# Patient Record
Sex: Female | Born: 1935 | Race: White | Hispanic: No | Smoking: Never smoker
Health system: Southern US, Community
[De-identification: ages and names within clinical notes are randomized; demographics above are authoritative.]

## PROBLEM LIST (undated history)

## (undated) DIAGNOSIS — Y92129 Unspecified place in nursing home as the place of occurrence of the external cause: Secondary | ICD-10-CM

## (undated) DIAGNOSIS — J309 Allergic rhinitis, unspecified: Secondary | ICD-10-CM

## (undated) DIAGNOSIS — E669 Obesity, unspecified: Secondary | ICD-10-CM

## (undated) DIAGNOSIS — M1990 Unspecified osteoarthritis, unspecified site: Secondary | ICD-10-CM

## (undated) DIAGNOSIS — R42 Dizziness and giddiness: Secondary | ICD-10-CM

## (undated) DIAGNOSIS — E7800 Pure hypercholesterolemia, unspecified: Secondary | ICD-10-CM

## (undated) DIAGNOSIS — W19XXXA Unspecified fall, initial encounter: Secondary | ICD-10-CM

## (undated) DIAGNOSIS — R413 Other amnesia: Secondary | ICD-10-CM

## (undated) DIAGNOSIS — I10 Essential (primary) hypertension: Secondary | ICD-10-CM

## (undated) DIAGNOSIS — H409 Unspecified glaucoma: Secondary | ICD-10-CM

## (undated) DIAGNOSIS — I359 Nonrheumatic aortic valve disorder, unspecified: Secondary | ICD-10-CM

## (undated) DIAGNOSIS — N390 Urinary tract infection, site not specified: Secondary | ICD-10-CM

## (undated) DIAGNOSIS — J42 Unspecified chronic bronchitis: Secondary | ICD-10-CM

## (undated) DIAGNOSIS — F0390 Unspecified dementia without behavioral disturbance: Secondary | ICD-10-CM

## (undated) DIAGNOSIS — F419 Anxiety disorder, unspecified: Secondary | ICD-10-CM

## (undated) DIAGNOSIS — E785 Hyperlipidemia, unspecified: Secondary | ICD-10-CM

## (undated) DIAGNOSIS — R011 Cardiac murmur, unspecified: Secondary | ICD-10-CM

## (undated) HISTORY — PX: TONSILLECTOMY: SUR1361

## (undated) HISTORY — DX: Unspecified glaucoma: H40.9

## (undated) HISTORY — PX: TOTAL KNEE ARTHROPLASTY: SHX125

## (undated) HISTORY — DX: Other amnesia: R41.3

## (undated) HISTORY — PX: JOINT REPLACEMENT: SHX530

## (undated) HISTORY — PX: APPENDECTOMY: SHX54

## (undated) HISTORY — PX: CARDIAC CATHETERIZATION: SHX172

## (undated) HISTORY — DX: Unspecified osteoarthritis, unspecified site: M19.90

## (undated) HISTORY — DX: Allergic rhinitis, unspecified: J30.9

## (undated) HISTORY — PX: CATARACT EXTRACTION W/ INTRAOCULAR LENS  IMPLANT, BILATERAL: SHX1307

## (undated) HISTORY — DX: Pure hypercholesterolemia, unspecified: E78.00

## (undated) HISTORY — PX: SHOULDER ARTHROSCOPY W/ ROTATOR CUFF REPAIR: SHX2400

## (undated) HISTORY — DX: Unspecified dementia, unspecified severity, without behavioral disturbance, psychotic disturbance, mood disturbance, and anxiety: F03.90

## (undated) HISTORY — DX: Nonrheumatic aortic valve disorder, unspecified: I35.9

## (undated) HISTORY — DX: Dizziness and giddiness: R42

## (undated) HISTORY — DX: Hyperlipidemia, unspecified: E78.5

## (undated) HISTORY — DX: Essential (primary) hypertension: I10

## (undated) HISTORY — DX: Obesity, unspecified: E66.9

---

## 1999-04-25 ENCOUNTER — Encounter: Payer: Self-pay | Admitting: *Deleted

## 1999-04-25 ENCOUNTER — Ambulatory Visit (HOSPITAL_COMMUNITY): Admission: RE | Admit: 1999-04-25 | Discharge: 1999-04-25 | Payer: Self-pay | Admitting: *Deleted

## 1999-12-18 ENCOUNTER — Ambulatory Visit: Admission: RE | Admit: 1999-12-18 | Discharge: 1999-12-18 | Payer: Self-pay | Admitting: *Deleted

## 1999-12-23 ENCOUNTER — Encounter: Payer: Self-pay | Admitting: Orthopedic Surgery

## 1999-12-31 ENCOUNTER — Inpatient Hospital Stay (HOSPITAL_COMMUNITY): Admission: RE | Admit: 1999-12-31 | Discharge: 2000-01-03 | Payer: Self-pay | Admitting: Orthopedic Surgery

## 2000-01-03 ENCOUNTER — Inpatient Hospital Stay (HOSPITAL_COMMUNITY)
Admission: RE | Admit: 2000-01-03 | Discharge: 2000-01-08 | Payer: Self-pay | Admitting: Physical Medicine & Rehabilitation

## 2000-01-05 ENCOUNTER — Encounter: Payer: Self-pay | Admitting: Physical Medicine & Rehabilitation

## 2003-03-06 ENCOUNTER — Ambulatory Visit: Admission: RE | Admit: 2003-03-06 | Discharge: 2003-03-06 | Payer: Self-pay | Admitting: Family Medicine

## 2005-02-12 ENCOUNTER — Ambulatory Visit: Payer: Self-pay | Admitting: Internal Medicine

## 2005-02-26 ENCOUNTER — Ambulatory Visit: Payer: Self-pay | Admitting: Internal Medicine

## 2005-03-11 ENCOUNTER — Emergency Department (HOSPITAL_COMMUNITY): Admission: EM | Admit: 2005-03-11 | Discharge: 2005-03-12 | Payer: Self-pay | Admitting: Emergency Medicine

## 2007-11-17 ENCOUNTER — Emergency Department (HOSPITAL_COMMUNITY): Admission: EM | Admit: 2007-11-17 | Discharge: 2007-11-17 | Payer: Self-pay | Admitting: Emergency Medicine

## 2008-04-19 ENCOUNTER — Inpatient Hospital Stay (HOSPITAL_BASED_OUTPATIENT_CLINIC_OR_DEPARTMENT_OTHER): Admission: RE | Admit: 2008-04-19 | Discharge: 2008-04-19 | Payer: Self-pay | Admitting: Cardiology

## 2010-01-30 ENCOUNTER — Encounter (INDEPENDENT_AMBULATORY_CARE_PROVIDER_SITE_OTHER): Payer: Self-pay | Admitting: *Deleted

## 2010-11-21 NOTE — Letter (Signed)
Summary: Colonoscopy Letter  [facility]  [address] [place], [place] [address]   Phone: [phone]  Fax: [phone]      January 30, 2010 MRN: [id number]   [name] [address] [place], [place]  [address]   Dear [name],   According to your medical record, it is time for you to schedule a Colonoscopy. The American Cancer Society recommends this procedure as a method to detect early colon cancer. Patients with a family history of colon cancer, or a personal history of colon polyps or inflammatory bowel disease are at increased risk.  This letter has beeen generated based on the recommendations made at the time of your procedure. If you feel that in your particular situation this may no longer apply, please contact our office.  Please call our office at [phone] to schedule this appointment or to update your records at your earliest convenience.  Thank you for cooperating with [place] to provide you with the very best care possible.   Sincerely,  Hedwig Morton. Juanda Chance, M.D.  [facility] [phone]

## 2011-03-04 NOTE — Cardiovascular Report (Signed)
[name], [name]              ACCOUNT NO.:  [id number]   MEDICAL RECORD NO.:  [id number]          PATIENT TYPE:  OIB   LOCATION:  1961                         FACILITY:  [facility]   PHYSICIAN:  Armanda Magic, M.D.     DATE OF BIRTH:  1924-10-13   DATE OF PROCEDURE:  04/19/2008  DATE OF DISCHARGE:                            CARDIAC CATHETERIZATION   REFERRING PHYSICIAN:  Dario Guardian, MD.   PROCEDURES:  1. Left heart catheterization.  2. Coronary angiography.  3. Left ventriculography.   OPERATOR:  Armanda Magic, MD   INDICATIONS:  Shortness of breath and abnormal stress Cardiolite study  as well as moderate aortic stenosis.   COMPLICATIONS:  None.   INTRAVENOUS MEDICATIONS:  Versed 1 mg, fentanyl 25 mcg, IV access via  right femoral artery 4-French sheath.   This is an 77 year old female who has a history of shortness of breath  and increased fatigue as well as heart murmur.  2-D echocardiogram  showed mild to moderate aortic stenosis with very mild pulmonary  hypertension.  RV systolic pressure 37 mmHg.  She was found to have a  perfusion defect of mild-to-moderate severity in the basal anterior  septal and apical inferior regions and now presents for cardiac  catheterization.   The patient was brought to cardiac catheterization laboratory in fasting  nonsedated state.  Informed consent was obtained.  The patient was  connected to continuous heart rate and pulse oximetry monitoring, and  intermittent blood pressure monitor.  The right groin was prepped and  draped in a sterile fashion.  Xylocaine 1% was used for local  anesthesia.  Using modified Seldinger technique, a 4-French sheath was  placed in the right femoral artery.  Under fluoroscopic guidance, a 4-  [place] JL-4 catheter was placed in the left coronary artery.  Multiple  cine films were taken at 30-degree RAO and 40-degree LAO views.  This  catheter was then exchanged out over a guidewire for a 4-French 3D  RCA  catheter which was placed in the right coronary artery ostium.  Multiple  cine films were taken in 30-degree RAO and 40-degree LAO views.  His  catheter was then exchanged out over a guidewire for a 4-French angled  pigtail catheter which was placed under fluoroscopic guidance in the  left ventricular cavity.  The left ventriculography was performed in a  30-degree RAO view using a total of 25 mL of contrast at 12 mL per  second.  The catheter was then pulled back across the aortic valve.  At  the end of the procedure, all catheters and sheaths were removed.  Manual compression was performed until adequate hemostasis was obtained.  The patient was transferred back to recovery room in stable condition.   RESULTS:  1. The left main coronary artery is widely patent and mildly      calcified.  It bifurcates into a left anterior descending artery      and left circumflex artery.   The left anterior descending artery is calcified in the proximal  portion.  It is widely patent throughout its course.  The apex can  be  raised to a first diagonal which is widely patent.   Left circumflex is widely patent throughout its course and gives rise to  three obtuse marginal branches.  All three of which are widely patent.  The first one is a moderate-sized vessel, the second and is small, and  the third obtuse marginal branch traverses across the inferior wall and  is widely patent.   The right coronary artery is widely patent and bifurcates distally.  The  posterior descending artery and posterior lateral artery were both of  which are widely patent.   Left ventriculography shows normal LV function, hyperdynamic with EF  70%, LV pressure 140/-8 with an LVEDP of 18 mmHg.  Aortic pressure  122/58 mmHg.  Mean aortic valve gradient 26 mmHg.   ASSESSMENT:  1. Normal coronary arteries.  2. Normal left ventricular function.  3. Mild to moderate aortic stenosis.  4. Elevated LVEDP consistent with  abnormal relaxation.  5. Very mild pulmonary hypertension by 2-D echocardiogram.  Right      ventricular systolic pressure 37 mmHg secondary diastolic      dysfunction.   PLAN:  1. Medical management.  We will add Toprol-XL 25 mg a day for elevated      LVEDP for treatment of diastolic dysfunction.  2. Follow up with my nurse practitioner in 2 weeks for followup.      Armanda Magic, M.D.  Electronically Signed     TT/MEDQ  D:  04/19/2008  T:  04/19/2008  Job:  [id number]

## 2011-03-07 NOTE — Discharge Summary (Signed)
[place] [place]  Patient:    [name], [name]                     MRN: [id number] Adm. Date:  [id number] Disc. Date: 01/08/00 Attending:  Faith Rogue T Dictator:   Mcarthur Rossetti. Angiulli, P.A. CC:         Faith Rogue, M.D.             Vania Rea. Supple, M.D.             Willis Modena. Dreiling, M.D.                           Discharge Summary  DISCHARGE DIAGNOSES: 1. Left total knee replacement, December 31, 1999. 2. Postoperative anemia. 3. History of a right total knee replacement, February, 1998. 4. History of right shoulder surgery and bilateral carpal tunnel.  HISTORY OF PRESENT ILLNESS:  A 77 year old female admitted to [facility] on March 13 with history of a right total knee replacement in February, 1998. he presented with progressive left knee pain, no relief with conservative care. X-rays with advanced degenerative joint disease, obliteration of joint space. he underwent a left total knee replacement, March 13, per Dr. Rennis Chris.  She was placed on Coumadin for deep venous thrombosis prophylaxis and weightbearing as tolerated. Postoperative anemia without need for transfusion.  Hospital course uneventful,  ambulating with supervision.  No chest pain or shortness of breath.  Latest INR of 2.0, hemoglobin 9.5.  Chemistries unremarkable. Chest x-ray - no active disease. EKG - normal sinus rhythm.  Admitted for a comprehensive rehab program.  PAST MEDICAL HISTORY:  Hemorrhoids and aortic stenosis with echocardiogram, February, 2001, showing only mild aortic stenosis, normal left ventricular function.  PAST SURGICAL HISTORY:  Right total knee replacement, right shoulder surgery, and bilateral carpal tunnel.  ALLERGIES:  None.  HABITS:  No alcohol or tobacco.  MEDICATIONS:  She was on no medications prior to admission.  SOCIAL HISTORY:  She lives with her husband in [place], independent prior to  admission.  They live in a  one-level home with three steps to entry.  Plans to ive with her daughter short-term on discharge until more mobile.  HOSPITAL COURSE:  Patient did well while in rehabilitation services with therapies initiated on a b.i.d. basis.  The following issues were followed during patients rehab course.  Pertaining to [name] left total knee replacement, it remained stable, surgical site showed staples to be in place.  There was some mild erythema. She did spike a temperature to 101.4.  Blood cultures were negative. Urinalysis was unremarkable.  Chest x-ray showed no active disease.  In light of this temperature, she was placed prophylactically on Keflex for wound coverage.  She  denied any chest pain, shortness of breath, no chills or sweats.  Her postoperative anemia was stable with latest hemoglobin of 8.9, hematocrit 26.1.  This showed  modest improvement from 8.4.  She continued her iron supplement.  There were no  bleeding episodes.  She continued on Pepcid while on Coumadin therapy.  She would remain on Coumadin for a total of four weeks to be followed by her primary physician, Dr. Lattie Corns.  Her right total knee replacement she had undergone in  February, 1998, was without issue.  She had some limited range of motion to the  shoulder girdle on the right from history of surgery.  Overall, for her functional mobility,  she was ambulating household functional distances with supervision, essentially independent to standby assist in all areas of activities of daily living, dressing, grooming, and homemaking.  Overall, her strength and endurance greatly improved.  She was encouraged of her overall progress.  She was discharged to home with her daughter after family teaching completed with recommendations or home health therapies.  Latest lab showed an INR of 2.1, hemoglobin 8.9, hematocrit 26.1, sodium 137, potassium 3.7, BUN 13, creatinine 0.5.  DISCHARGE MEDICATIONS: 1.  Coumadin daily with dose to be established at time of discharge x 3 more weeks. 2. trinsicon one capsule twice daily. 3. Pepcid 20 mg daily. 4. Tylox one or two tablets every 4 to 6 hours as needed for pain.  ACTIVITY:  Weightbearing as tolerated with walker.  DIET:  Regular.  WOUND CARE:  Follow with Dr. Rennis Chris in seven days for removal of staples. Patient was to remain on Keflex of 250 mg four times daily for wound coverage until follow-up with Dr. Rennis Chris.  Advised to call orthopedic surgery if any increased  redness, drainage, or fever.  SPECIAL INSTRUCTIONS:  No driving, no aspirin or ibuprofen while on Coumadin. ome health physical therapy, home health nurse for prothrombin time on Friday, March 23, to be followed by Dr. Lattie Corns. DD:  01/07/00 TD:  01/07/00 Job: [id number]

## 2011-05-07 ENCOUNTER — Telehealth: Payer: Self-pay | Admitting: *Deleted

## 2011-05-07 NOTE — Telephone Encounter (Signed)
Patient is overdue for recall colonoscopy to follow up on adenomatous colon polyps and diverticulosis found in 2006 procedure. Patient is 77 years old, therefore, she will need an office visit first. I have left a message for patient to call back.

## 2011-05-16 NOTE — Telephone Encounter (Signed)
Patient states that she did get a letter and that she thinks her daughter is going to make an appointment for her to come in. I have explained that she will need an office visit before a colonoscopy due to her age of 64. Patient verbalizes understanding and states that her daughter will call back to schedule.

## 2013-07-25 ENCOUNTER — Other Ambulatory Visit (HOSPITAL_COMMUNITY): Payer: Self-pay

## 2013-08-05 ENCOUNTER — Other Ambulatory Visit (HOSPITAL_COMMUNITY): Payer: Self-pay | Admitting: Cardiology

## 2013-08-05 DIAGNOSIS — I359 Nonrheumatic aortic valve disorder, unspecified: Secondary | ICD-10-CM

## 2013-08-09 ENCOUNTER — Other Ambulatory Visit (HOSPITAL_COMMUNITY): Payer: Self-pay

## 2013-08-22 ENCOUNTER — Ambulatory Visit (HOSPITAL_COMMUNITY): Payer: Medicare Other | Attending: Cardiology

## 2013-08-22 DIAGNOSIS — I059 Rheumatic mitral valve disease, unspecified: Secondary | ICD-10-CM | POA: Insufficient documentation

## 2013-08-22 DIAGNOSIS — I379 Nonrheumatic pulmonary valve disorder, unspecified: Secondary | ICD-10-CM | POA: Insufficient documentation

## 2013-08-22 DIAGNOSIS — I10 Essential (primary) hypertension: Secondary | ICD-10-CM | POA: Insufficient documentation

## 2013-08-22 DIAGNOSIS — I079 Rheumatic tricuspid valve disease, unspecified: Secondary | ICD-10-CM | POA: Insufficient documentation

## 2013-08-22 DIAGNOSIS — I359 Nonrheumatic aortic valve disorder, unspecified: Secondary | ICD-10-CM | POA: Insufficient documentation

## 2013-08-22 NOTE — Progress Notes (Signed)
Echocardiogram performed.  

## 2013-09-06 ENCOUNTER — Encounter: Payer: Self-pay | Admitting: *Deleted

## 2013-09-06 ENCOUNTER — Encounter: Payer: Self-pay | Admitting: Cardiology

## 2013-09-06 DIAGNOSIS — E7800 Pure hypercholesterolemia, unspecified: Secondary | ICD-10-CM | POA: Insufficient documentation

## 2013-09-06 DIAGNOSIS — E785 Hyperlipidemia, unspecified: Secondary | ICD-10-CM | POA: Insufficient documentation

## 2013-09-06 DIAGNOSIS — I350 Nonrheumatic aortic (valve) stenosis: Secondary | ICD-10-CM | POA: Insufficient documentation

## 2013-09-06 DIAGNOSIS — M1990 Unspecified osteoarthritis, unspecified site: Secondary | ICD-10-CM | POA: Insufficient documentation

## 2013-09-06 DIAGNOSIS — J309 Allergic rhinitis, unspecified: Secondary | ICD-10-CM | POA: Insufficient documentation

## 2013-09-06 DIAGNOSIS — E669 Obesity, unspecified: Secondary | ICD-10-CM | POA: Insufficient documentation

## 2013-09-07 ENCOUNTER — Ambulatory Visit (INDEPENDENT_AMBULATORY_CARE_PROVIDER_SITE_OTHER): Payer: Medicare Other | Admitting: Cardiology

## 2013-09-07 ENCOUNTER — Encounter: Payer: Self-pay | Admitting: Cardiology

## 2013-09-07 ENCOUNTER — Encounter: Payer: Self-pay | Admitting: General Surgery

## 2013-09-07 VITALS — BP 138/64 | HR 80 | Ht 60.0 in | Wt 170.0 lb

## 2013-09-07 DIAGNOSIS — I359 Nonrheumatic aortic valve disorder, unspecified: Secondary | ICD-10-CM

## 2013-09-07 DIAGNOSIS — I10 Essential (primary) hypertension: Secondary | ICD-10-CM | POA: Insufficient documentation

## 2013-09-07 NOTE — Patient Instructions (Addendum)
Your physician recommends that you continue on your current medications as directed. Please refer to the Current Medication list given to you today.  Your physician wants you to follow-up in: 6 Months with Dr Turner You will receive a reminder letter in the mail two months in advance. If you don't receive a letter, please call our office to schedule the follow-up appointment.  

## 2013-09-07 NOTE — Progress Notes (Addendum)
  [address] [place], [place]  [address] Phone: [phone] Fax:  [phone]  Date:  09/07/2013   ID:  [name], DOB May 24, 1924, MRN [id number]  PCP:  Merri Brunette, MD  Cardiologist:  Armanda Magic, MD     History of Present Illness: [name] is a 79 y.o. female with a history of moderate to severe AS and HTN who presents today for followup.  She is doing well.  She denies any chest pain, SOB, DOE, LE edema, dizziness, palpitations or syncope.   Wt Readings from Last 3 Encounters:  No data found for Wt     Past Medical History  Diagnosis Date  . Aortic valve disorders   . Allergic rhinitis   . Arthritis   . Dyslipidemia   . Obesity   . Glaucoma   . Hypercholesteremia   . Vertigo   . Hypertension     Current Outpatient Prescriptions  Medication Sig Dispense Refill  . aspirin 81 MG tablet Take 81 mg by mouth daily.      . bimatoprost (LUMIGAN) 0.03 % ophthalmic solution 1 drop at bedtime.      [name] ezetimibe-simvastatin (VYTORIN) 10-20 MG per tablet Take 1 tablet by mouth daily.      . metoprolol succinate (TOPROL-XL) 25 MG 24 hr tablet Take 25 mg by mouth daily.       No current facility-administered medications for this visit.    Allergies:   Not on File  Social History:  The patient  reports that she has never smoked. She does not have any smokeless tobacco history on file. She reports that she does not drink alcohol or use illicit drugs.   Family History:  The patient's family history includes Heart disease in her sister.   ROS:  Please see the history of present illness.      All other systems reviewed and negative.   PHYSICAL EXAM: VS:  There were no vitals taken for this visit. Well nourished, well developed, in no acute distress HEENT: normal Neck: no JVD Cardiac:  normal S1, S2; RRR; 2/6 late peaking systolic murmur at RUSB radiating to LLSB and left carotid artery Lungs:  clear to auscultation bilaterally, no wheezing,  rhonchi or rales Abd: soft, nontender, no hepatomegaly Ext: no edema Skin: warm and dry Neuro:  CNs 2-12 intact, no focal abnormalities noted  EKG:     NSR  ASSESSMENT AND PLAN:  1. Moderate to Severe AS  - recheck echo to reassess progression of AS in 6 months 2. HTN - controlled  - continue Toprol  Followup with me in 6 months  Signed, Armanda Magic, MD 09/07/2013 11:45 AM

## 2013-09-29 ENCOUNTER — Telehealth: Payer: Self-pay | Admitting: Cardiology

## 2013-09-29 ENCOUNTER — Other Ambulatory Visit: Payer: Self-pay

## 2013-09-29 MED ORDER — METOPROLOL SUCCINATE ER 25 MG PO TB24: 25.0000 mg | ORAL_TABLET | Freq: Every day | ORAL | Status: DC

## 2013-09-29 NOTE — Telephone Encounter (Signed)
New message     Refill metoprolol-------[facility]

## 2015-07-17 ENCOUNTER — Ambulatory Visit (INDEPENDENT_AMBULATORY_CARE_PROVIDER_SITE_OTHER): Payer: Medicare Other | Admitting: Cardiology

## 2015-07-17 ENCOUNTER — Encounter: Payer: Self-pay | Admitting: Cardiology

## 2015-07-17 VITALS — BP 136/62 | HR 94 | Ht 61.0 in | Wt 170.4 lb

## 2015-07-17 DIAGNOSIS — I359 Nonrheumatic aortic valve disorder, unspecified: Secondary | ICD-10-CM | POA: Diagnosis not present

## 2015-07-17 DIAGNOSIS — I350 Nonrheumatic aortic (valve) stenosis: Secondary | ICD-10-CM

## 2015-07-17 DIAGNOSIS — I10 Essential (primary) hypertension: Secondary | ICD-10-CM | POA: Diagnosis not present

## 2015-07-17 MED ORDER — METOPROLOL SUCCINATE ER 25 MG PO TB24: 25.0000 mg | ORAL_TABLET | Freq: Every day | ORAL | Status: DC

## 2015-07-17 NOTE — Patient Instructions (Addendum)
Medication Instructions:  Your physician recommends that you continue on your current medications as directed. Please refer to the Current Medication list given to you today.      Labwork: NONE ORDERED  Testing/Procedures: Your physician has requested that you have an echocardiogram. Echocardiography is a painless test that uses sound waves to create images of your heart. It provides your doctor with information about the size and shape of your heart and how well your heart's chambers and valves are working. This procedure takes approximately one hour. There are no restrictions for this procedure.    Follow-Up: Your physician wants you to follow-up BASED UPON THE RESULTS FROM YOUR ECHO.  YOU WILL CONTACTED BY PHONE.   Any Other Special Instructions Will Be Listed Below (If Applicable).  Echocardiogram An echocardiogram, or echocardiography, uses sound waves (ultrasound) to produce an image of your heart. The echocardiogram is simple, painless, obtained within a short period of time, and offers valuable information to your health care provider. The images from an echocardiogram can provide information such as:  Evidence of coronary artery disease (CAD).  Heart size.  Heart muscle function.  Heart valve function.  Aneurysm detection.  Evidence of a past heart attack.  Fluid buildup around the heart.  Heart muscle thickening.  Assess heart valve function. LET [facility] CARE PROVIDER KNOW ABOUT:  Any allergies you have.  All medicines you are taking, including vitamins, herbs, eye drops, creams, and over-the-counter medicines.  Previous problems you or members of your family have had with the use of anesthetics.  Any blood disorders you have.  Previous surgeries you have had.  Medical conditions you have.  Possibility of pregnancy, if this applies. BEFORE THE PROCEDURE  No special preparation is needed. Eat and drink normally.  PROCEDURE   In order to produce  an image of your heart, gel will be applied to your chest and a wand-like tool (transducer) will be moved over your chest. The gel will help transmit the sound waves from the transducer. The sound waves will harmlessly bounce off your heart to allow the heart images to be captured in real-time motion. These images will then be recorded.  You may need an IV to receive a medicine that improves the quality of the pictures. AFTER THE PROCEDURE You may return to your normal schedule including diet, activities, and medicines, unless your health care provider tells you otherwise. Document Released: 10/03/2000 Document Revised: 02/20/2014 Document Reviewed: 06/13/2013 [facility] Patient Information 2015 [place], [place]. This information is not intended to replace advice given to you by your health care provider. Make sure you discuss any questions you have with your health care provider.

## 2015-07-17 NOTE — Progress Notes (Signed)
07/17/2015 [name]   02-15-1924  [id number]  Primary Physician Allean Found, MD Primary Cardiologist: Dr. Mayford Knife   Reason for Visit/CC: Follow-up for aortic stenosis  HPI:  The patient is a 81 year old female followed in the past by Dr. Mayford Knife. Her last office visit was in November 2014. She has a history of aortic stenosis, hypertension and HLD.  Her family doctor follows her lipid panel. Her last echocardiogram was 08/22/2013. Aortic stenosis at that time was felt to be moderate to severe, also mild regurgitation. Aortic valve area was measured at 1.16 cm. Mean gradient was 41 mmHg. Ventricular ejection fraction was normal at that time at 55-60% with normal wall motion. Grade 2 diastolic dysfunction was noted. It was recommended by Dr. Mayford Knife that she get a repeat echo to reassess progression of her AS in 6 months, however the patient failed to follow-up.  She presents back to clinic today for f/u. She is accompanied by her daughter. She presents back to re-establish care and for re-evaluation. She denies any chest pain. She does note exertional dyspnea with her ADLs, especially while dressing and bathing. She denies any resting dyspnea, orthopnea or PND. She also denies syncope/ near syncope. She reports full medication compliance.    Current Outpatient Prescriptions  Medication Sig Dispense Refill  . aspirin 81 MG tablet Take 81 mg by mouth daily.    [name] atorvastatin (LIPITOR) 20 MG tablet Take 20 mg by mouth daily.    [name] ezetimibe-simvastatin (VYTORIN) 10-20 MG per tablet Take 1 tablet by mouth daily.    . metoprolol succinate (TOPROL-XL) 25 MG 24 hr tablet Take 1 tablet (25 mg total) by mouth daily. 30 tablet 6  . TRAVATAN Z 0.004 % SOLN ophthalmic solution Place 1 drop into both eyes at bedtime.      No current facility-administered medications for this visit.    No Known Allergies  Social History   Social History  . Marital Status: Widowed    Spouse Name: N/A    . Number of Children: N/A  . Years of Education: N/A   Occupational History  . Not on file.   Social History Main Topics  . Smoking status: Never Smoker   . Smokeless tobacco: Never Used  . Alcohol Use: No  . Drug Use: No  . Sexual Activity: Not on file   Other Topics Concern  . Not on file   Social History Narrative     Review of Systems: General: negative for chills, fever, night sweats or weight changes.  Cardiovascular: negative for chest pain, dyspnea on exertion, edema, orthopnea, palpitations, paroxysmal nocturnal dyspnea or shortness of breath Dermatological: negative for rash Respiratory: negative for cough or wheezing Urologic: negative for hematuria Abdominal: negative for nausea, vomiting, diarrhea, bright red blood per rectum, melena, or hematemesis Neurologic: negative for visual changes, syncope, or dizziness All other systems reviewed and are otherwise negative except as noted above.    Blood pressure 136/62, pulse 94, height  (1.549 m), weight 170 lb 6.4 oz (77.293 kg).  General appearance: alert, cooperative and no distress Neck: no carotid bruit and no JVD Lungs: clear to auscultation bilaterally Heart: regular rate and rhythm and 4/6 SM throughout the precordium. Extremities: trace bilateteral LEE Pulses: 2+ and symmetric Skin: warm and dry Neurologic: Grossly normal  EKG NSR. 94 bpm. No ischemia.   ASSESSMENT AND PLAN:   1. Aortic Stenosis: Her last assessment was in 2014 and her aortic stenosis at that time was felt to  be moderate to severe with an aortic valve area of 1.16 cm and a mean gradient of 41 mmHg. She denies any angina or syncope/near syncop,e however notes exertional dyspnea with mild physical activity/basic ADLs. No resting dyspnea. Recommend that we get a repeat echocardiogram to reassess her valve area and gradient, as well as LVF. Given her advanced age, may be best to consider TAVR as a potential treatment option. Will await  results before placing a referral to the [facility].   2. HTN: BP is well controlled.   3. HLD: on statin therapy with Lipitor. PCP follows lipid profile.    PLAN  Obtain f/u 2D echo to reassess aortic valve. Will consider referral to Dr. Excell Seltzer or Dr. Clifton James if results show severe progression. F/u with Dr. Mayford Knife in 6 months.   SIMMONS, BRITTAINY PA-C 07/17/2015 2:22 PM

## 2015-07-31 ENCOUNTER — Other Ambulatory Visit (HOSPITAL_COMMUNITY): Payer: Medicare Other

## 2015-11-14 ENCOUNTER — Encounter (HOSPITAL_COMMUNITY): Payer: Self-pay

## 2015-11-14 ENCOUNTER — Emergency Department (HOSPITAL_COMMUNITY): Payer: Medicare Other

## 2015-11-14 ENCOUNTER — Inpatient Hospital Stay (HOSPITAL_COMMUNITY)
Admission: EM | Admit: 2015-11-14 | Discharge: 2015-11-16 | DRG: 086 | Disposition: A | Discharge status: Skilled Nursing Facility | Payer: Medicare Other | Attending: Internal Medicine | Admitting: Internal Medicine

## 2015-11-14 DIAGNOSIS — W19XXXA Unspecified fall, initial encounter: Secondary | ICD-10-CM | POA: Diagnosis present

## 2015-11-14 DIAGNOSIS — S52691A Other fracture of lower end of right ulna, initial encounter for closed fracture: Secondary | ICD-10-CM | POA: Diagnosis present

## 2015-11-14 DIAGNOSIS — S0083XA Contusion of other part of head, initial encounter: Secondary | ICD-10-CM | POA: Diagnosis not present

## 2015-11-14 DIAGNOSIS — Z7982 Long term (current) use of aspirin: Secondary | ICD-10-CM

## 2015-11-14 DIAGNOSIS — Y92018 Other place in single-family (private) house as the place of occurrence of the external cause: Secondary | ICD-10-CM

## 2015-11-14 DIAGNOSIS — Z8249 Family history of ischemic heart disease and other diseases of the circulatory system: Secondary | ICD-10-CM

## 2015-11-14 DIAGNOSIS — S62101A Fracture of unspecified carpal bone, right wrist, initial encounter for closed fracture: Secondary | ICD-10-CM | POA: Diagnosis not present

## 2015-11-14 DIAGNOSIS — S066X0A Traumatic subarachnoid hemorrhage without loss of consciousness, initial encounter: Principal | ICD-10-CM | POA: Diagnosis present

## 2015-11-14 DIAGNOSIS — I609 Nontraumatic subarachnoid hemorrhage, unspecified: Secondary | ICD-10-CM | POA: Diagnosis not present

## 2015-11-14 DIAGNOSIS — W010XXA Fall on same level from slipping, tripping and stumbling without subsequent striking against object, initial encounter: Secondary | ICD-10-CM | POA: Diagnosis present

## 2015-11-14 DIAGNOSIS — R627 Adult failure to thrive: Secondary | ICD-10-CM | POA: Diagnosis not present

## 2015-11-14 DIAGNOSIS — M25512 Pain in left shoulder: Secondary | ICD-10-CM | POA: Diagnosis present

## 2015-11-14 DIAGNOSIS — S52591A Other fractures of lower end of right radius, initial encounter for closed fracture: Secondary | ICD-10-CM | POA: Diagnosis present

## 2015-11-14 DIAGNOSIS — I350 Nonrheumatic aortic (valve) stenosis: Secondary | ICD-10-CM

## 2015-11-14 DIAGNOSIS — Z66 Do not resuscitate: Secondary | ICD-10-CM | POA: Diagnosis present

## 2015-11-14 DIAGNOSIS — S52531A Colles' fracture of right radius, initial encounter for closed fracture: Secondary | ICD-10-CM

## 2015-11-14 DIAGNOSIS — E7800 Pure hypercholesterolemia, unspecified: Secondary | ICD-10-CM | POA: Diagnosis present

## 2015-11-14 DIAGNOSIS — I5189 Other ill-defined heart diseases: Secondary | ICD-10-CM | POA: Diagnosis present

## 2015-11-14 DIAGNOSIS — E785 Hyperlipidemia, unspecified: Secondary | ICD-10-CM | POA: Diagnosis present

## 2015-11-14 DIAGNOSIS — S62109A Fracture of unspecified carpal bone, unspecified wrist, initial encounter for closed fracture: Secondary | ICD-10-CM | POA: Diagnosis present

## 2015-11-14 DIAGNOSIS — B3789 Other sites of candidiasis: Secondary | ICD-10-CM | POA: Diagnosis present

## 2015-11-14 DIAGNOSIS — I10 Essential (primary) hypertension: Secondary | ICD-10-CM | POA: Diagnosis present

## 2015-11-14 LAB — I-STAT CHEM 8, ED
BUN: 19 mg/dL (ref 6–20)
CALCIUM ION: 1.14 mmol/L (ref 1.13–1.30)
CREATININE: 0.6 mg/dL (ref 0.44–1.00)
Chloride: 105 mmol/L (ref 101–111)
GLUCOSE: 111 mg/dL — AB (ref 65–99)
HCT: 42 % (ref 36.0–46.0)
Hemoglobin: 14.3 g/dL (ref 12.0–15.0)
Potassium: 4.3 mmol/L (ref 3.5–5.1)
SODIUM: 141 mmol/L (ref 135–145)
TCO2: 26 mmol/L (ref 0–100)

## 2015-11-14 LAB — URINALYSIS, ROUTINE W REFLEX MICROSCOPIC
BILIRUBIN URINE: NEGATIVE
Glucose, UA: NEGATIVE mg/dL
HGB URINE DIPSTICK: NEGATIVE
KETONES UR: 40 mg/dL — AB
Leukocytes, UA: NEGATIVE
NITRITE: NEGATIVE
Protein, ur: NEGATIVE mg/dL
Specific Gravity, Urine: 1.014 (ref 1.005–1.030)
pH: 7.5 (ref 5.0–8.0)

## 2015-11-14 LAB — CBC WITH DIFFERENTIAL/PLATELET
BASOS PCT: 0 %
Basophils Absolute: 0 10*3/uL (ref 0.0–0.1)
EOS ABS: 0.1 10*3/uL (ref 0.0–0.7)
EOS PCT: 1 %
HCT: 40.8 % (ref 36.0–46.0)
Hemoglobin: 13.1 g/dL (ref 12.0–15.0)
Lymphocytes Relative: 26 %
Lymphs Abs: 3.3 10*3/uL (ref 0.7–4.0)
MCH: 29.5 pg (ref 26.0–34.0)
MCHC: 32.1 g/dL (ref 30.0–36.0)
MCV: 91.9 fL (ref 78.0–100.0)
MONO ABS: 1 10*3/uL (ref 0.1–1.0)
MONOS PCT: 8 %
NEUTROS PCT: 65 %
Neutro Abs: 8.2 10*3/uL — ABNORMAL HIGH (ref 1.7–7.7)
PLATELETS: 211 10*3/uL (ref 150–400)
RBC: 4.44 MIL/uL (ref 3.87–5.11)
RDW: 14.2 % (ref 11.5–15.5)
WBC: 12.6 10*3/uL — ABNORMAL HIGH (ref 4.0–10.5)

## 2015-11-14 LAB — BASIC METABOLIC PANEL
Anion gap: 9 (ref 5–15)
BUN: 18 mg/dL (ref 6–20)
CALCIUM: 9.1 mg/dL (ref 8.9–10.3)
CO2: 26 mmol/L (ref 22–32)
CREATININE: 0.54 mg/dL (ref 0.44–1.00)
Chloride: 105 mmol/L (ref 101–111)
GFR calc non Af Amer: >60 mL/min (ref >60)
Glucose, Bld: 117 mg/dL — ABNORMAL HIGH (ref 65–99)
Potassium: 4.4 mmol/L (ref 3.5–5.1)
SODIUM: 140 mmol/L (ref 135–145)

## 2015-11-14 MED ORDER — HYDROMORPHONE HCL 1 MG/ML IJ SOLN
0.5000 mg | INTRAMUSCULAR | Status: AC | PRN
  Administered 2015-11-14 (×2): 0.5 mg via INTRAVENOUS
  Filled 2015-11-14 (×2): qty 1

## 2015-11-14 MED ORDER — METOPROLOL SUCCINATE ER 25 MG PO TB24
25.0000 mg | ORAL_TABLET | Freq: Every day | ORAL | Status: DC
  Administered 2015-11-14 – 2015-11-16 (×3): 25 mg via ORAL
  Filled 2015-11-14 (×3): qty 1

## 2015-11-14 MED ORDER — ADULT MULTIVITAMIN W/MINERALS CH
1.0000 | ORAL_TABLET | Freq: Every day | ORAL | Status: DC
  Administered 2015-11-14 – 2015-11-16 (×3): 1 via ORAL
  Filled 2015-11-14 (×3): qty 1

## 2015-11-14 MED ORDER — NYSTATIN 100000 UNIT/GM EX POWD
Freq: Two times a day (BID) | CUTANEOUS | Status: DC
  Administered 2015-11-14 – 2015-11-15 (×2): via TOPICAL
  Filled 2015-11-14: qty 15

## 2015-11-14 MED ORDER — EZETIMIBE-SIMVASTATIN 10-20 MG PO TABS
1.0000 | ORAL_TABLET | Freq: Every day | ORAL | Status: DC
  Administered 2015-11-14 – 2015-11-16 (×3): 1 via ORAL
  Filled 2015-11-14 (×3): qty 1

## 2015-11-14 MED ORDER — LATANOPROST 0.005 % OP SOLN
1.0000 [drp] | Freq: Every day | OPHTHALMIC | Status: DC
  Administered 2015-11-14 – 2015-11-15 (×2): 1 [drp] via OPHTHALMIC
  Filled 2015-11-14: qty 2.5

## 2015-11-14 MED ORDER — TRAMADOL HCL 50 MG PO TABS
50.0000 mg | ORAL_TABLET | Freq: Four times a day (QID) | ORAL | Status: DC | PRN
  Administered 2015-11-14 – 2015-11-16 (×4): 50 mg via ORAL
  Filled 2015-11-14 (×4): qty 1

## 2015-11-14 MED ORDER — ONDANSETRON HCL 4 MG/2ML IJ SOLN: 4.0000 mg | Freq: Three times a day (TID) | INTRAMUSCULAR | Status: AC | PRN

## 2015-11-14 NOTE — H&P (Addendum)
History and Physical  [name]:[id number] DOB: 29-Sep-1924 DOA: 11/14/2015  Referring physician: EDP PCP: Allean Found, MD   Chief Complaint: fall  HPI: [name] is a 82 y.o. female  With h/o aortic stenosis, HTN, HLD, osteoarthritis presented to [facility] ED due to fall yesterday evening, now with significant bruising over her face on the left side and a large bruise on her left upper arm. Patient reported she has been having unsteadiness for the past year which she stopped driving, and also stopped going to church, but no prior falls except yesterday evening. She has been using a walker. She denies fever, chest pain, no sob, no palpitation, no dizziness, no tinnitus, no room spinning, no LOC, no diarrhea, no dysuria, weight has been stable last year.   Review of Systems:  Detail per HPI, Review of systems are otherwise negative  Past Medical History  Diagnosis Date  . Aortic valve disorders   . Allergic rhinitis   . Arthritis   . Dyslipidemia   . Obesity   . Glaucoma   . Hypercholesteremia   . Vertigo   . Hypertension    Past Surgical History  Procedure Laterality Date  . Appendectomy    . Shoulder surgery    . Bilataral cataracts removal with lens replacement dr. Darel Hong    . Cardiac catheterization      normal coronary arteries   Social History:  reports that she has never smoked. She has never used smokeless tobacco. She reports that she does not drink alcohol or use illicit drugs. Patient lives at home& is able to participate in activities of daily living with assistance  No Known Allergies  Family History  Problem Relation Age of Onset  . Heart disease Sister       Prior to Admission medications   Medication Sig Start Date End Date Taking? Authorizing Provider  acetaminophen (TYLENOL) 500 MG tablet Take 1,000 mg by mouth every 6 (six) hours as needed for moderate pain or headache.   Yes Historical Provider, MD  aspirin 81 MG tablet Take 81  mg by mouth daily.   Yes Historical Provider, MD  atorvastatin (LIPITOR) 20 MG tablet Take 20 mg by mouth daily. 08/29/13  Yes Historical Provider, MD  ezetimibe-simvastatin (VYTORIN) 10-20 MG per tablet Take 1 tablet by mouth daily.   Yes Historical Provider, MD  metoprolol succinate (TOPROL-XL) 25 MG 24 hr tablet Take 1 tablet (25 mg total) by mouth daily. 07/17/15  Yes Brittainy Sherlynn Carbon, PA-C  Multiple Vitamin (MULTIVITAMIN WITH MINERALS) TABS tablet Take 1 tablet by mouth daily.   Yes Historical Provider, MD  traMADol (ULTRAM) 50 MG tablet Take 50 mg by mouth 4 (four) times daily as needed for moderate pain.  11/05/15  Yes Historical Provider, MD  TRAVATAN Z 0.004 % SOLN ophthalmic solution Place 1 drop into both eyes at bedtime.  08/08/13  Yes Historical Provider, MD    Physical Exam: There were no vitals taken for this visit.  General:  NAD, facial bruising, more around her left eye Eyes: PERRL ENT: unremarkable Neck: supple, no JVD Cardiovascular: RRR, + 3/6 murmur Respiratory: CTABL Abdomen: soft/ND/ND, positive bowel sounds Skin: fungal rash under bilateral breast with odor Musculoskeletal:  No edema Psychiatric: calm/cooperative Neurologic: no focal findings            Labs on Admission:  Basic Metabolic Panel: No results for input(s): NA, K, CL, CO2, GLUCOSE, BUN, CREATININE, CALCIUM, MG, PHOS in the last 168 hours. Liver  Function Tests: No results for input(s): AST, ALT, ALKPHOS, BILITOT, PROT, ALBUMIN in the last 168 hours. No results for input(s): LIPASE, AMYLASE in the last 168 hours. No results for input(s): AMMONIA in the last 168 hours. CBC: No results for input(s): WBC, NEUTROABS, HGB, HCT, MCV, PLT in the last 168 hours. Cardiac Enzymes: No results for input(s): CKTOTAL, CKMB, CKMBINDEX, TROPONINI in the last 168 hours.  BNP (last 3 results) No results for input(s): BNP in the last 8760 hours.  ProBNP (last 3 results) No results for input(s): PROBNP in  the last 8760 hours.  CBG: No results for input(s): GLUCAP in the last 168 hours.  Radiological Exams on Admission: Dg Wrist Complete Right  11/14/2015  CLINICAL DATA:  Pain following fall EXAM: RIGHT WRIST - COMPLETE 3+ VIEW COMPARISON:  None. FINDINGS: Frontal, oblique, lateral, and ulnar deviation scaphoid images were obtained. There is a comminuted fracture of the distal radial metaphysis with impaction at the fracture site. There is volar angulation distally. There is an avulsion of the ulnar styloid. There is generalized soft tissue swelling. Bones are osteoporotic. There is advanced osteoarthritic change in the first carpal -metacarpal joint. 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 There is extensive triangular fiber cartilage region calcification. IMPRESSION: Comminuted, impacted fracture of the distal radial metaphysis with volar angulation distally. Avulsion of the ulnar styloid. Extensive calcification in the triangular fibrocartilage, likely due to chronic tear in this region. 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 Extensive osteoarthritic change in the first carpal -metacarpal joint. Bones are osteoporotic. Electronically Signed   By: Bretta Bang III M.D.   On: 11/14/2015 14:44   Ct Head Wo Contrast  11/14/2015  CLINICAL DATA:  Fall EXAM: CT HEAD WITHOUT CONTRAST CT MAXILLOFACIAL WITHOUT CONTRAST TECHNIQUE: Multidetector CT imaging of the head and maxillofacial structures were performed using the standard protocol without intravenous contrast. Multiplanar CT image reconstructions of the maxillofacial structures were also generated. COMPARISON:  03/11/2005 FINDINGS: CT HEAD FINDINGS There is a small amount of subarachnoid hemorrhage in the right parietal lobe. Chronic ischemic changes are present in the periventricular white matter. There is soft tissue swelling over the frontal bone to the left of midline. No  underlying skull fracture. There is fluid in the frontal sinuses. Mastoid air cells are clear. No midline shift. Ventricular system is within normal limits other than ex vacuo dilatation from atrophy. CT MAXILLOFACIAL FINDINGS No evidence of facial bone fracture. Advanced degenerative and chronic changes in the cervical spine. Soft tissue swelling is noted over the frontal bone and nasal bridge. No evidence of orbital hemorrhage. IMPRESSION: Acute subarachnoid hemorrhage in the right parietal region. No evidence of facial bone fracture. Critical Value/emergent results were called by telephone at the time of interpretation on 11/14/2015 at 3:12 pm to Dr. Nelva Nay , who verbally acknowledged these results. Electronically Signed   By: Jolaine Click M.D.   On: 11/14/2015 15:12   Dg Shoulder Left  11/14/2015  CLINICAL DATA:  Pain following fall EXAM: LEFT SHOULDER - 2+ VIEW COMPARISON:  None. FINDINGS: Frontal and Y scapular views obtained. There is no appreciable fracture or dislocation. There is advanced osteoarthritic change in the left glenohumeral joint with marked bony remodeling in the glenohumeral joint. There is moderately severe narrowing of the acromioclavicular joint. There is narrowing between the humeral head and acromion. No well-defined erosive change. There is calcification in the left and right carotid arteries. IMPRESSION: Advanced generalized  osteoarthritic change in the left shoulder with marked bony overgrowth and narrowing of the glenohumeral joint. No acute fracture or dislocation is evident. Suspect chronic rotator cuff tear given the loss of space between the acromion and humeral head. Note that there is bilateral carotid artery calcification. Electronically Signed   By: Bretta Bang III M.D.   On: 11/14/2015 14:42   Dg Humerus Left  11/14/2015  CLINICAL DATA:  Status post fall.  Left arm pain. EXAM: LEFT HUMERUS - 2+ VIEW COMPARISON:  None. FINDINGS: No acute fracture or  dislocation. Severe osteoarthritis of the left glenohumeral joint. No soft tissue abnormality. IMPRESSION: No acute osseous injury of the left humerus. Electronically Signed   By: Elige Ko   On: 11/14/2015 14:44   Dg Hand Complete Right  11/14/2015  CLINICAL DATA:  Fall yesterday with right hand and wrist pain, initial encounter EXAM: RIGHT HAND - COMPLETE 3+ VIEW COMPARISON:  None. FINDINGS: Comminuted fracture of the distal right radius is noted involving the radiocarpal articulation. Impaction and some anterior angulation is noted at the fracture site. Degenerative changes in the interphalangeal joints as well as within the carpal bones are seen. Ulnar styloid fracture is noted as well. IMPRESSION: Comminuted distal radial and ulnar fractures Electronically Signed   By: Alcide Clever M.D.   On: 11/14/2015 14:43   Ct Maxillofacial Wo Cm  11/14/2015  CLINICAL DATA:  Fall EXAM: CT HEAD WITHOUT CONTRAST CT MAXILLOFACIAL WITHOUT CONTRAST TECHNIQUE: Multidetector CT imaging of the head and maxillofacial structures were performed using the standard protocol without intravenous contrast. Multiplanar CT image reconstructions of the maxillofacial structures were also generated. COMPARISON:  03/11/2005 FINDINGS: CT HEAD FINDINGS There is a small amount of subarachnoid hemorrhage in the right parietal lobe. Chronic ischemic changes are present in the periventricular white matter. There is soft tissue swelling over the frontal bone to the left of midline. No underlying skull fracture. There is fluid in the frontal sinuses. Mastoid air cells are clear. No midline shift. Ventricular system is within normal limits other than ex vacuo dilatation from atrophy. CT MAXILLOFACIAL FINDINGS No evidence of facial bone fracture. Advanced degenerative and chronic changes in the cervical spine. Soft tissue swelling is noted over the frontal bone and nasal bridge. No evidence of orbital hemorrhage. IMPRESSION: Acute subarachnoid  hemorrhage in the right parietal region. No evidence of facial bone fracture. Critical Value/emergent results were called by telephone at the time of interpretation on 11/14/2015 at 3:12 pm to Dr. Nelva Nay , who verbally acknowledged these results. Electronically Signed   By: Jolaine Click M.D.   On: 11/14/2015 15:12    EKG: Independently reviewed. Sinus rhythm on tele  Assessment/Plan Present on Admission:  . Wrist fracture  Comminuted distal radial and ulnar fractures: EDP Discussed case with Dr. Mina Marble from hand surgery who recommended a sugar tong splint and follow-up can be in his office in a week.  Acute subarachnoid hemorrhage in the right parietal region: EDP Discussed findings with CT scan of the head with Dr. Marikay Alar from neurosurgery who recommended conservative observation, per Dr Yetta Barre " would not repeat CT head" unless patient's has mental status or neurologic changes.  Fall/FTT: patient attribute the fall to her progressive unsteadiness, she denies LOC/chest pain/sob , will get PT, will at least need home health if not short term rehab.  Yeast under bilateral breast: topical nystatin powder  Aortic stenosis: last seen by cardiology for this in 06/2015, patient denies associated symptom, denies chest pain,  no dizziness, no sob, will get echo, put her on tele.   DVT prophylaxis: scd's  Consultants:  Hand surgery  neurosurgery  Code Status: DNR  Family Communication:  Patient and daughter in room  Disposition Plan: admit to tele/obs  Time spent:  Igor Bishop MD, PhD [facility] Pager [phone] If 7PM-7AM, please contact night-coverage at [email or web address], password [facility]

## 2015-11-14 NOTE — ED Notes (Signed)
[facility] tech contacted

## 2015-11-14 NOTE — ED Notes (Signed)
Pt denies any hip pain or knee pain at this time.  No bruising noted on pt's back at this time.   Very small bruise noted in her R medial knee

## 2015-11-14 NOTE — Progress Notes (Signed)
Patient ID: [name], female   DOB: 08-05-1924, 82 y.o.   MRN: [id number] I was called to look at the head CT on this 82 year old female who fell. She is being admitted to the hospitalist. She has a tiny faint hyperdensity in the right parietal region with no mass effect or shift or edema. This is consistent with a tiny amount of traumatic subarachnoid blood. This will hopefully and likely be of no clinical significance. I would Not repeat her CT scan of the head and less she had some change in mental status or neurologic exam as I think it would have a low likelihood of showing something that would change our management plans. Please call if I can be of further assistance.

## 2015-11-14 NOTE — ED Notes (Addendum)
Pt reports fall yesterday.  States she fell backwards, however, significant bruising noted on pt's face.  Around bila eyes and L side of her face.  There is also a large bruise in her L upper arm.  Pt reports pain in her L shoulder when moving her L arm.  Pt is unable to move her R arm d/t fall "years ago."  Pt lives by  Herself.   Pt is noted to have what appears to be yeast under bila breasts with redness and it smells malodorous.  Same was observed under pt's bila axilla.

## 2015-11-14 NOTE — ED Notes (Signed)
Bed: [facility] Expected date:  Expected time:  Means of arrival:  Comments: EMS- 82yo F, fall/facial injury/R arm pain

## 2015-11-14 NOTE — ED Provider Notes (Signed)
CSN: [id number]     Arrival date & time 11/14/15  1219 History   First MD Initiated Contact with Patient 11/14/15 1337     Chief Complaint  Patient presents with  . Fall     ) HPI Pt reports fall yesterday. States she fell backwards, however, significant bruising noted on pt's face. Around bila eyes and L side of her face. There is also a large bruise in her L upper arm. Pt reports pain in her L shoulder when moving her L arm. Pt is unable to move her R arm d/t fall "years ago." Pt lives by Herself. Pt is noted to have what appears to be yeast under bila breasts with redness and it smells malodorous. Same was observed under pt's bila axilla.  Past Medical History  Diagnosis Date  . Aortic valve disorders   . Allergic rhinitis   . Arthritis   . Dyslipidemia   . Obesity   . Glaucoma   . Hypercholesteremia   . Vertigo   . Hypertension    Past Surgical History  Procedure Laterality Date  . Appendectomy    . Shoulder surgery    . Bilataral cataracts removal with lens replacement dr. Darel Hong    . Cardiac catheterization      normal coronary arteries   Family History  Problem Relation Age of Onset  . Heart disease Sister    Social History  Substance Use Topics  . Smoking status: Never Smoker   . Smokeless tobacco: Never Used  . Alcohol Use: No   OB History    No data available     Review of Systems  All other systems reviewed and are negative.     Allergies  Review of patient's allergies indicates no known allergies.  Home Medications   Prior to Admission medications   Medication Sig Start Date End Date Taking? Authorizing Provider  acetaminophen (TYLENOL) 500 MG tablet Take 1,000 mg by mouth every 6 (six) hours as needed for moderate pain or headache.   Yes Historical Provider, MD  aspirin 81 MG tablet Take 81 mg by mouth daily.   Yes Historical Provider, MD  atorvastatin (LIPITOR) 20 MG tablet Take 20 mg by mouth daily. 08/29/13  Yes Historical  Provider, MD  ezetimibe-simvastatin (VYTORIN) 10-20 MG per tablet Take 1 tablet by mouth daily.   Yes Historical Provider, MD  metoprolol succinate (TOPROL-XL) 25 MG 24 hr tablet Take 1 tablet (25 mg total) by mouth daily. 07/17/15  Yes Brittainy Sherlynn Carbon, PA-C  Multiple Vitamin (MULTIVITAMIN WITH MINERALS) TABS tablet Take 1 tablet by mouth daily.   Yes Historical Provider, MD  traMADol (ULTRAM) 50 MG tablet Take 50 mg by mouth 4 (four) times daily as needed for moderate pain.  11/05/15  Yes Historical Provider, MD  TRAVATAN Z 0.004 % SOLN ophthalmic solution Place 1 drop into both eyes at bedtime.  08/08/13  Yes Historical Provider, MD   There were no vitals taken for this visit. Physical Exam  Constitutional: She is oriented to person, place, and time. She appears well-developed and well-nourished. No distress.  HENT:  Head: Normocephalic.    Eyes: Pupils are equal, round, and reactive to light.  Neck: Normal range of motion.  Cardiovascular: Normal rate and intact distal pulses.   Pulmonary/Chest: No respiratory distress.  Abdominal: Normal appearance. She exhibits no distension.  Musculoskeletal: She exhibits tenderness.       Right wrist: She exhibits decreased range of motion, swelling and deformity.  Right upper arm: She exhibits tenderness. She exhibits no bony tenderness.  Neurological: She is alert and oriented to person, place, and time. No cranial nerve deficit.  Skin: Skin is warm and dry. No rash noted.  Psychiatric: She has a normal mood and affect. Her behavior is normal.  Nursing note and vitals reviewed.   ED Course  Procedures (including critical care time) Labs Review Labs Reviewed  I-STAT CHEM 8, ED    Imaging Review Dg Wrist Complete Right  11/14/2015  CLINICAL DATA:  Pain following fall EXAM: RIGHT WRIST - COMPLETE 3+ VIEW COMPARISON:  None. FINDINGS: Frontal, oblique, lateral, and ulnar deviation scaphoid images were obtained. There is a comminuted  fracture of the distal radial metaphysis with impaction at the fracture site. There is volar angulation distally. There is an avulsion of the ulnar styloid. There is generalized soft tissue swelling. Bones are osteoporotic. There is advanced osteoarthritic change in the first carpal -metacarpal joint. 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 There is extensive triangular fiber cartilage region calcification. IMPRESSION: Comminuted, impacted fracture of the distal radial metaphysis with volar angulation distally. Avulsion of the ulnar styloid. Extensive calcification in the triangular fibrocartilage, likely due to chronic tear in this region. 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 Extensive osteoarthritic change in the first carpal -metacarpal joint. Bones are osteoporotic. Electronically Signed   By: Bretta Bang III M.D.   On: 11/14/2015 14:44   Ct Head Wo Contrast  11/14/2015  CLINICAL DATA:  Fall EXAM: CT HEAD WITHOUT CONTRAST CT MAXILLOFACIAL WITHOUT CONTRAST TECHNIQUE: Multidetector CT imaging of the head and maxillofacial structures were performed using the standard protocol without intravenous contrast. Multiplanar CT image reconstructions of the maxillofacial structures were also generated. COMPARISON:  03/11/2005 FINDINGS: CT HEAD FINDINGS There is a small amount of subarachnoid hemorrhage in the right parietal lobe. Chronic ischemic changes are present in the periventricular white matter. There is soft tissue swelling over the frontal bone to the left of midline. No underlying skull fracture. There is fluid in the frontal sinuses. Mastoid air cells are clear. No midline shift. Ventricular system is within normal limits other than ex vacuo dilatation from atrophy. CT MAXILLOFACIAL FINDINGS No evidence of facial bone fracture. Advanced degenerative and chronic changes in the cervical spine. Soft tissue swelling is noted  over the frontal bone and nasal bridge. No evidence of orbital hemorrhage. IMPRESSION: Acute subarachnoid hemorrhage in the right parietal region. No evidence of facial bone fracture. Critical Value/emergent results were called by telephone at the time of interpretation on 11/14/2015 at 3:12 pm to Dr. Nelva Nay , who verbally acknowledged these results. Electronically Signed   By: Jolaine Click M.D.   On: 11/14/2015 15:12   Dg Shoulder Left  11/14/2015  CLINICAL DATA:  Pain following fall EXAM: LEFT SHOULDER - 2+ VIEW COMPARISON:  None. FINDINGS: Frontal and Y scapular views obtained. There is no appreciable fracture or dislocation. There is advanced osteoarthritic change in the left glenohumeral joint with marked bony remodeling in the glenohumeral joint. There is moderately severe narrowing of the acromioclavicular joint. There is narrowing between the humeral head and acromion. No well-defined erosive change. There is calcification in the left and right carotid arteries. IMPRESSION: Advanced generalized osteoarthritic change in the left shoulder with marked bony overgrowth and narrowing of the glenohumeral joint. No acute fracture or dislocation is evident. Suspect chronic rotator cuff tear given the loss of space between the acromion  and humeral head. Note that there is bilateral carotid artery calcification. Electronically Signed   By: Bretta Bang III M.D.   On: 11/14/2015 14:42   Dg Humerus Left  11/14/2015  CLINICAL DATA:  Status post fall.  Left arm pain. EXAM: LEFT HUMERUS - 2+ VIEW COMPARISON:  None. FINDINGS: No acute fracture or dislocation. Severe osteoarthritis of the left glenohumeral joint. No soft tissue abnormality. IMPRESSION: No acute osseous injury of the left humerus. Electronically Signed   By: Elige Ko   On: 11/14/2015 14:44   Dg Hand Complete Right  11/14/2015  CLINICAL DATA:  Fall yesterday with right hand and wrist pain, initial encounter EXAM: RIGHT HAND - COMPLETE 3+  VIEW COMPARISON:  None. FINDINGS: Comminuted fracture of the distal right radius is noted involving the radiocarpal articulation. Impaction and some anterior angulation is noted at the fracture site. Degenerative changes in the interphalangeal joints as well as within the carpal bones are seen. Ulnar styloid fracture is noted as well. IMPRESSION: Comminuted distal radial and ulnar fractures Electronically Signed   By: Alcide Clever M.D.   On: 11/14/2015 14:43   Ct Maxillofacial Wo Cm  11/14/2015  CLINICAL DATA:  Fall EXAM: CT HEAD WITHOUT CONTRAST CT MAXILLOFACIAL WITHOUT CONTRAST TECHNIQUE: Multidetector CT imaging of the head and maxillofacial structures were performed using the standard protocol without intravenous contrast. Multiplanar CT image reconstructions of the maxillofacial structures were also generated. COMPARISON:  03/11/2005 FINDINGS: CT HEAD FINDINGS There is a small amount of subarachnoid hemorrhage in the right parietal lobe. Chronic ischemic changes are present in the periventricular white matter. There is soft tissue swelling over the frontal bone to the left of midline. No underlying skull fracture. There is fluid in the frontal sinuses. Mastoid air cells are clear. No midline shift. Ventricular system is within normal limits other than ex vacuo dilatation from atrophy. CT MAXILLOFACIAL FINDINGS No evidence of facial bone fracture. Advanced degenerative and chronic changes in the cervical spine. Soft tissue swelling is noted over the frontal bone and nasal bridge. No evidence of orbital hemorrhage. IMPRESSION: Acute subarachnoid hemorrhage in the right parietal region. No evidence of facial bone fracture. Critical Value/emergent results were called by telephone at the time of interpretation on 11/14/2015 at 3:12 pm to Dr. Nelva Nay , who verbally acknowledged these results. Electronically Signed   By: Jolaine Click M.D.   On: 11/14/2015 15:12   I have personally reviewed and evaluated  these images and lab results as part of my medical decision-making.  Discussed with Dr. Mina Marble from hand surgery about the wrist fracture who recommended a sugar tong splint and follow-up can be in his office in a week.  Discussed findings with CT scan of the head with Dr. Marikay Alar from neurosurgery.  He will place a note in the chart but observation is all he recommended at this time.   MDM   Final diagnoses:  Fall  Subarachnoid bleed (HCC)  Colles' fracture, right, closed, initial encounter        Nelva Nay, MD 11/15/15 1258

## 2015-11-15 ENCOUNTER — Observation Stay (HOSPITAL_COMMUNITY): Payer: Medicare Other

## 2015-11-15 DIAGNOSIS — S0083XA Contusion of other part of head, initial encounter: Secondary | ICD-10-CM | POA: Diagnosis present

## 2015-11-15 DIAGNOSIS — M25512 Pain in left shoulder: Secondary | ICD-10-CM | POA: Diagnosis present

## 2015-11-15 DIAGNOSIS — S52591A Other fractures of lower end of right radius, initial encounter for closed fracture: Secondary | ICD-10-CM | POA: Diagnosis present

## 2015-11-15 DIAGNOSIS — S62101A Fracture of unspecified carpal bone, right wrist, initial encounter for closed fracture: Secondary | ICD-10-CM | POA: Diagnosis not present

## 2015-11-15 DIAGNOSIS — Y92018 Other place in single-family (private) house as the place of occurrence of the external cause: Secondary | ICD-10-CM | POA: Diagnosis not present

## 2015-11-15 DIAGNOSIS — Z7982 Long term (current) use of aspirin: Secondary | ICD-10-CM | POA: Diagnosis not present

## 2015-11-15 DIAGNOSIS — I350 Nonrheumatic aortic (valve) stenosis: Secondary | ICD-10-CM

## 2015-11-15 DIAGNOSIS — E7800 Pure hypercholesterolemia, unspecified: Secondary | ICD-10-CM | POA: Diagnosis present

## 2015-11-15 DIAGNOSIS — I609 Nontraumatic subarachnoid hemorrhage, unspecified: Secondary | ICD-10-CM | POA: Diagnosis not present

## 2015-11-15 DIAGNOSIS — I10 Essential (primary) hypertension: Secondary | ICD-10-CM | POA: Diagnosis present

## 2015-11-15 DIAGNOSIS — W19XXXA Unspecified fall, initial encounter: Secondary | ICD-10-CM | POA: Diagnosis not present

## 2015-11-15 DIAGNOSIS — B3789 Other sites of candidiasis: Secondary | ICD-10-CM | POA: Diagnosis present

## 2015-11-15 DIAGNOSIS — R627 Adult failure to thrive: Secondary | ICD-10-CM | POA: Diagnosis present

## 2015-11-15 DIAGNOSIS — W010XXA Fall on same level from slipping, tripping and stumbling without subsequent striking against object, initial encounter: Secondary | ICD-10-CM | POA: Diagnosis present

## 2015-11-15 DIAGNOSIS — Z66 Do not resuscitate: Secondary | ICD-10-CM | POA: Diagnosis present

## 2015-11-15 DIAGNOSIS — Z8249 Family history of ischemic heart disease and other diseases of the circulatory system: Secondary | ICD-10-CM | POA: Diagnosis not present

## 2015-11-15 DIAGNOSIS — S066X0A Traumatic subarachnoid hemorrhage without loss of consciousness, initial encounter: Secondary | ICD-10-CM | POA: Diagnosis present

## 2015-11-15 DIAGNOSIS — S52691A Other fracture of lower end of right ulna, initial encounter for closed fracture: Secondary | ICD-10-CM | POA: Diagnosis present

## 2015-11-15 LAB — CBC
HEMATOCRIT: 40.8 % (ref 36.0–46.0)
HEMOGLOBIN: 13 g/dL (ref 12.0–15.0)
MCH: 29.1 pg (ref 26.0–34.0)
MCHC: 31.9 g/dL (ref 30.0–36.0)
MCV: 91.3 fL (ref 78.0–100.0)
Platelets: 213 10*3/uL (ref 150–400)
RBC: 4.47 MIL/uL (ref 3.87–5.11)
RDW: 14.4 % (ref 11.5–15.5)
WBC: 13.1 10*3/uL — ABNORMAL HIGH (ref 4.0–10.5)

## 2015-11-15 LAB — PROTIME-INR
INR: 1.18 (ref 0.00–1.49)
PROTHROMBIN TIME: 14.7 s (ref 11.6–15.2)

## 2015-11-15 LAB — COMPREHENSIVE METABOLIC PANEL
ALBUMIN: 3.6 g/dL (ref 3.5–5.0)
ALT: 15 U/L (ref 14–54)
AST: 24 U/L (ref 15–41)
Alkaline Phosphatase: 64 U/L (ref 38–126)
Anion gap: 10 (ref 5–15)
BILIRUBIN TOTAL: 0.9 mg/dL (ref 0.3–1.2)
BUN: 17 mg/dL (ref 6–20)
CHLORIDE: 103 mmol/L (ref 101–111)
CO2: 25 mmol/L (ref 22–32)
Calcium: 9.1 mg/dL (ref 8.9–10.3)
Creatinine, Ser: 0.53 mg/dL (ref 0.44–1.00)
GFR calc Af Amer: >60 mL/min (ref >60)
GFR calc non Af Amer: >60 mL/min (ref >60)
GLUCOSE: 110 mg/dL — AB (ref 65–99)
POTASSIUM: 4 mmol/L (ref 3.5–5.1)
Sodium: 138 mmol/L (ref 135–145)
Total Protein: 6.5 g/dL (ref 6.5–8.1)

## 2015-11-15 NOTE — Evaluation (Signed)
Physical Therapy Evaluation Patient Details Name: [name] MRN: [id number] DOB: 10-06-1924 Today's Date: 11/15/2015   History of Present Illness  82 yo female admitted with acute subarachnoid hemorrhage R parietal, R comminuted/impacted distal radius and ulnar fractures. Hx of aortic stenosis, HTN, OA, L rotator cuff injury, vertigo.  Clinical Impression  On eval, pt required Min assist +2 for mobility-walked ~12 feet in room with handheld assist on L. Pt is very unsteady and remains at risk for falls. Daughter present during session-states that she takes care of pt. However, unsure if she can safely manage with pt at current level. Recommend SNF if pt/family agreeable.     Follow Up Recommendations SNF    Equipment Recommendations  None recommended by PT    Recommendations for Other Services       Precautions / Restrictions Precautions Precautions: Fall Required Braces or Orthoses: Sling Restrictions Weight Bearing Restrictions: Yes RUE Weight Bearing: Non weight bearing      Mobility  Bed Mobility Overal bed mobility: Needs Assistance Bed Mobility: Supine to Sit     Supine to sit: Min assist;+2 for safety/equipment;+2 for physical assistance;HOB elevated     General bed mobility comments: Assist for LEs and trunk. Assist to scoot to EOB. Increased time. Pt unable to assist with either UE.   Transfers Overall transfer level: Needs assistance Equipment used: 2 person hand held assist Transfers: Sit to/from Stand Sit to Stand: Min assist;+2 physical assistance;+2 safety/equipment         General transfer comment: Assist to stabilize. Unsteady and remains at risk for falls.  Pt held therapists fingers with her L hand  Ambulation/Gait Ambulation/Gait assistance: Min assist;+2 physical assistance;+2 safety/equipment Ambulation Distance (Feet): 12 Feet   Gait Pattern/deviations: Decreased stride length;Staggering right;Staggering left     General Gait  Details: Assist to stabilize. Unsteady and remains at risk for falls.  Pt held therapists fingers with her L hand  Stairs            Wheelchair Mobility    Modified Rankin (Stroke Patients Only)       Balance Overall balance assessment: Needs assistance         Standing balance support: Single extremity supported;During functional activity Standing balance-Leahy Scale: Poor                               Pertinent Vitals/Pain Pain Assessment: Faces Pain Score: 2  Faces Pain Scale: Hurts a little bit Pain Location: R wrist Pain Descriptors / Indicators: Sore Pain Intervention(s): Monitored during session    Home Living Family/patient expects to be discharged to:: Unsure Living Arrangements: Alone Available Help at Discharge: Family Type of Home: House Home Access: Stairs to enter Entrance Stairs-Rails: Doctor, general practice of Steps: a few Home Layout: One level Home Equipment: Environmental consultant - 2 wheels;Cane - single point;Bedside commode;Shower seat      Prior Function Level of Independence: Needs assistance   Gait / Transfers Assistance Needed: using walker at baseline  ADL's / Homemaking Assistance Needed: sponge bathing        Hand Dominance        Extremity/Trunk Assessment   Upper Extremity Assessment: Defer to OT evaluation RUE Deficits / Details: Pt splinted MCPs and crossed over elbow.  Fingers swollen--educated on wiggling fingers and positioning     LUE Deficits / Details: Pt has a h/o rotator cuff tear; unable to move shoulder--creeps fingers upward and bends elbow--did  not straighten entirely.  States bil fingertips have decreased feeling   Lower Extremity Assessment: Generalized weakness         Communication   Communication: HOH  Cognition Arousal/Alertness: Awake/alert Behavior During Therapy: WFL for tasks assessed/performed Overall Cognitive Status: Impaired/Different from baseline Area of Impairment:  Memory                    General Comments      Exercises        Assessment/Plan    PT Assessment Patient needs continued PT services  PT Diagnosis Difficulty walking;Generalized weakness;Acute pain   PT Problem List Decreased activity tolerance;Decreased balance;Decreased mobility;Decreased knowledge of use of DME;Pain;Decreased strength  PT Treatment Interventions Gait training;DME instruction;Functional mobility training;Therapeutic activities;Patient/family education;Therapeutic exercise;Balance training   PT Goals (Current goals can be found in the Care Plan section) Acute Rehab PT Goals Patient Stated Goal: get back to being independent; be able to do things herself PT Goal Formulation: With patient/family Time For Goal Achievement: 11/29/15 Potential to Achieve Goals: Fair    Frequency Min 3X/week   Barriers to discharge        Co-evaluation               End of Session Equipment Utilized During Treatment: Gait belt Activity Tolerance: Patient tolerated treatment well Patient left: in chair;with call bell/phone within reach;with chair alarm set;with family/visitor present      Functional Assessment Tool Used: clinical judgement Functional Limitation: Mobility: Walking and moving around Mobility: Walking and Moving Around Current Status [phone]): At least 20 percent but less than 40 percent impaired, limited or restricted Mobility: Walking and Moving Around Goal Status [phone]): At least 1 percent but less than 20 percent impaired, limited or restricted    Time: 1141-1207 PT Time Calculation (min) (ACUTE ONLY): 26 min   Charges:   PT Evaluation $PT Eval Moderate Complexity: 1 Procedure     PT G Codes:   PT G-Codes **NOT FOR INPATIENT CLASS** Functional Assessment Tool Used: clinical judgement Functional Limitation: Mobility: Walking and moving around Mobility: Walking and Moving Around Current Status (Y7829): At least 20 percent but less than  40 percent impaired, limited or restricted Mobility: Walking and Moving Around Goal Status [phone]): At least 1 percent but less than 20 percent impaired, limited or restricted    Rebeca Alert, MPT Pager: [phone]

## 2015-11-15 NOTE — Progress Notes (Signed)
PROGRESS NOTE  [name] ION:[id number] DOB: 1924/06/27 DOA: 11/14/2015 PCP: Allean Found, MD  HPI/Recap of past 24 hours: Reported pain better controlled, denies headache  Assessment/Plan: Active Problems:   Wrist fracture   Fall  Comminuted distal radial and ulnar fractures: EDP Discussed case with Dr. Mina Marble from hand surgery who recommended a sugar tong splint and follow-up can be in his office in a week.  Acute subarachnoid hemorrhage in the right parietal region: EDP Discussed findings with CT scan of the head with Dr. Marikay Alar from neurosurgery who recommended conservative observation, per Dr Yetta Barre " would not repeat CT head" unless patient's has mental status or neurologic changes.  Fall/FTT: patient attribute the fall to her progressive unsteadiness, she denies LOC/chest pain/sob , PT recommended SNF  Yeast under bilateral breast: topical nystatin powder  Aortic stenosis: last seen by cardiology for this in 06/2015, patient denies associated symptom, denies chest pain, no dizziness, no sob,  Echo with severe aortic stenosis, continue tele. Call cardiology in am.   DVT prophylaxis: scd's  Consultants:  Hand surgery  neurosurgery  Code Status: DNR  Family Communication: Patient and daughter in room  Disposition Plan:   likely need snf    Objective: BP 141/69 mmHg  Pulse 83  Temp(Src) 98 F (36.7 C) (Oral)  Resp 18  Ht  (1.6 m)  Wt 72.6 kg (160 lb 0.9 oz)  BMI 28.36 kg/m2  SpO2 95%  Intake/Output Summary (Last 24 hours) at 11/15/15 1727 Last data filed at 11/15/15 1326  Gross per 24 hour  Intake    840 ml  Output      0 ml  Net    840 ml   Filed Weights   11/15/15 0311  Weight: 72.6 kg (160 lb 0.9 oz)    Exam:   General: NAD, facial bruising, more around her left eye  Eyes: PERRL  ENT: unremarkable  Neck: supple, no JVD  Cardiovascular: RRR, + 3/6 murmur  Respiratory: CTABL  Abdomen: soft/ND/ND, positive  bowel sounds  Skin: fungal rash under bilateral breast with odor  Musculoskeletal: No edema  Psychiatric: calm/cooperative  Neurologic: no focal findings    Data Reviewed: Basic Metabolic Panel:  Recent Labs Lab 11/14/15 1601 11/14/15 1612 11/15/15 0552  NA 140 141 138  K 4.4 4.3 4.0  CL 105 105 103  CO2 26  --  25  GLUCOSE 117* 111* 110*  BUN CREATININE 0.54 0.60 0.53  CALCIUM 9.1  --  9.1   Liver Function Tests:  Recent Labs Lab 11/15/15 0552  AST 24  ALT 15  ALKPHOS 64  BILITOT 0.9  PROT 6.5  ALBUMIN 3.6   No results for input(s): LIPASE, AMYLASE in the last 168 hours. No results for input(s): AMMONIA in the last 168 hours. CBC:  Recent Labs Lab 11/14/15 1601 11/14/15 1612 11/15/15 0552  WBC 12.6*  --  13.1*  NEUTROABS 8.2*  --   --   HGB 13.1 14.3 13.0  HCT 40.8 42.0 40.8  MCV 91.9  --  91.3  PLT 211  --  213   Cardiac Enzymes:   No results for input(s): CKTOTAL, CKMB, CKMBINDEX, TROPONINI in the last 168 hours. BNP (last 3 results) No results for input(s): BNP in the last 8760 hours.  ProBNP (last 3 results) No results for input(s): PROBNP in the last 8760 hours.  CBG: No results for input(s): GLUCAP in the last 168 hours.  No results found  for this or any previous visit (from the past 240 hour(s)).   Studies: No results found.  Scheduled Meds: . ezetimibe-simvastatin  1 tablet Oral Daily  . latanoprost  1 drop Both Eyes QHS  . metoprolol succinate  25 mg Oral Daily  . multivitamin with minerals  1 tablet Oral Daily  . nystatin   Topical BID    Continuous Infusions:    Time spent:  [name] MD, PhD  [facility] Pager [phone]. If 7PM-7AM, please contact night-coverage at [email or web address], password [facility] 11/15/2015, 5:27 PM  LOS: 1 day

## 2015-11-15 NOTE — Consult Note (Signed)
Chaplain following spiritual care consult: pt requesting information about updating advance directives.    Pt's daughter at bedside.  Pt reports she has living will and will look at this with her daughter to determine if they would like to update this.  Chaplain provided education around [place] and Living Will.  Pt did not desire to make changes today.    Provided brief emotional support at bedside and ensured pt knows how to contact spiritual care.    Belva Crome MDiv

## 2015-11-15 NOTE — Evaluation (Signed)
Occupational Therapy Evaluation Patient Details Name: [name] MRN: [id number] DOB: 10-03-1924 Today's Date: 11/15/2015    History of Present Illness Pt fell and sustained SAH and R comminuted wrist (radial and ulnar) fx, which is splinted.  H/o L rotator cuff injury.  Pt also has a h/o vertigo but fall was mechanical   Clinical Impression   This 82 year old female was admitted for the above. She will benefit from skilled OT to increase safety and independence with adls.  Pt was mod I prior to admission. She needs mostly total A for ADLs due to L pre-existing shoulder injury and new R wrist fx.  She will benefit from skilled OT for edema management, toilet transfers and to work on positioning so pt can participate in self feeding with LUE.    Follow Up Recommendations  SNF    Equipment Recommendations  None recommended by OT    Recommendations for Other Services       Precautions / Restrictions Precautions Precautions: Fall Restrictions Weight Bearing Restrictions: Yes RUE Weight Bearing: Non weight bearing      Mobility Bed Mobility Overal bed mobility: Needs Assistance Bed Mobility: Supine to Sit     Supine to sit: Mod assist     General bed mobility comments: assist for trunk  Transfers Overall transfer level: Needs assistance Equipment used: 2 person hand held assist Transfers: Sit to/from Stand Sit to Stand: Min assist;+2 physical assistance;+2 safety/equipment         General transfer comment: steadying assistance. Pt held therapists fingers with her L hand    Balance                                            ADL Overall ADL's : Needs assistance/impaired                         Toilet Transfer: Minimal assistance;+2 for safety/equipment (chair; hand held assist and gait belt)             General ADL Comments: Pt needs total A for ADLs.  Pt splinted with RUE and L shoulder very limited with AROM      Vision     Perception     Praxis      Pertinent Vitals/Pain Pain Assessment: Faces Pain Score: 2  Faces Pain Scale: Hurts a little bit Pain Location: R wrist Pain Intervention(s): Limited activity within patient's tolerance;Monitored during session;Premedicated before session;Repositioned     Hand Dominance     Extremity/Trunk Assessment Upper Extremity Assessment Upper Extremity Assessment: RUE deficits/detail;LUE deficits/detail RUE Deficits / Details: Pt splinted MCPs and crossed over elbow.  Fingers swollen--educated on wiggling fingers and positioning.  Pt able to move fingers a little--edema present and repositioned LUE Deficits / Details: Pt has a h/o rotator cuff tear; unable to move shoulder--creeps fingers upward and bends elbow--did not straighten entirely.  States bil fingertips have decreased feeling           Communication Communication Communication: HOH   Cognition Arousal/Alertness: Awake/alert Behavior During Therapy: WFL for tasks assessed/performed Overall Cognitive Status: Impaired/Different from baseline (decreased memory)                     General Comments       Exercises       Shoulder Instructions  Home Living Family/patient expects to be discharged to:: Unsure Living Arrangements: Alone Available Help at Discharge: Family Type of Home: House Home Access: Stairs to enter [employer] of Steps: a few Entrance Stairs-Rails: Right;Left Home Layout: One level     Bathroom Shower/Tub: Tub/shower unit         Home Equipment: Environmental consultant - 2 wheels;Cane - single point;Bedside commode;Shower seat          Prior Functioning/Environment Level of Independence: Needs assistance  Gait / Transfers Assistance Needed: using walker at baseline ADL's / Homemaking Assistance Needed: sponge bathing        OT Diagnosis: Generalized weakness   OT Problem List: Decreased strength;Decreased range of motion;Decreased  activity tolerance;Impaired balance (sitting and/or standing);Decreased cognition;Pain;Impaired UE functional use;Impaired sensation;Increased edema   OT Treatment/Interventions: Self-care/ADL training;DME and/or AE instruction;Patient/family education;Therapeutic activities;Cognitive remediation/compensation    OT Goals(Current goals can be found in the care plan section) Acute Rehab OT Goals Patient Stated Goal: get back to being independent; be able to do things herself OT Goal Formulation: With patient Time For Goal Achievement: 11/22/15 Potential to Achieve Goals: Good ADL Goals Pt Will Perform Eating: with mod assist;sitting (L hand with adapted set up) Pt Will Transfer to Toilet: with min assist;bedside commode;stand pivot transfer Additional ADL Goal #1: pt/family will verbalize edema strategies (moving fingers hourly) and positioning and request assistance for positioning as needed  OT Frequency: Min 2X/week   Barriers to D/C:            Co-evaluation              End of Session Nurse Communication: Mobility status  Activity Tolerance: Patient tolerated treatment well Patient left: in chair;with call bell/phone within reach;with chair alarm set;with family/visitor present   Time: 0454-0981 OT Time Calculation (min): 26 min Charges:  OT General Charges $OT Visit: 1 Procedure OT Evaluation $OT Eval Moderate Complexity: 1 Procedure G-Codes: OT G-codes **NOT FOR INPATIENT CLASS** Functional Assessment Tool Used: clinical observation and judgement Functional Limitation: Self care Self Care Current Status (X9147): 100 percent impaired, limited or restricted Self Care Goal Status (W2956): At least 40 percent but less than 60 percent impaired, limited or restricted  [facility] 11/15/2015, 12:35 PM Marica Otter, OTR/L [phone] 11/15/2015

## 2015-11-15 NOTE — Progress Notes (Signed)
  Echocardiogram 2D Echocardiogram has been performed.  Leta Jungling M 11/15/2015, 11:44 AM

## 2015-11-15 NOTE — Care Management Obs Status (Signed)
MEDICARE OBSERVATION STATUS NOTIFICATION   Patient Details  Name: [name] MRN: [id number] Date of Birth: 1924-05-22   Medicare Observation Status Notification Given:  Yes    MahabirOlegario Messier, RN 11/15/2015, 3:57 PM

## 2015-11-15 NOTE — Progress Notes (Signed)
CSW reviewed PT evaluation recommending SNF - CSW spoke with patient's daughter, [name] who informed CSW that she plans to take patient back home at discharge. Daughter informed CSW that she would be able to stay with her and provide care. Daughter is agreeable with home health - RNCM, Olegario Messier aware.   No further CSW needs identified - CSW signing off.   Lincoln Maxin, LCSW [facility] Clinical Social Worker cell #: [phone]

## 2015-11-16 ENCOUNTER — Other Ambulatory Visit: Payer: Self-pay

## 2015-11-16 DIAGNOSIS — I609 Nontraumatic subarachnoid hemorrhage, unspecified: Secondary | ICD-10-CM

## 2015-11-16 DIAGNOSIS — I5189 Other ill-defined heart diseases: Secondary | ICD-10-CM | POA: Diagnosis present

## 2015-11-16 LAB — CBC
HEMATOCRIT: 40 % (ref 36.0–46.0)
HEMOGLOBIN: 12.8 g/dL (ref 12.0–15.0)
MCH: 29.1 pg (ref 26.0–34.0)
MCHC: 32 g/dL (ref 30.0–36.0)
MCV: 90.9 fL (ref 78.0–100.0)
Platelets: 207 10*3/uL (ref 150–400)
RBC: 4.4 MIL/uL (ref 3.87–5.11)
RDW: 14.5 % (ref 11.5–15.5)
WBC: 12.9 10*3/uL — AB (ref 4.0–10.5)

## 2015-11-16 LAB — BASIC METABOLIC PANEL
ANION GAP: 10 (ref 5–15)
BUN: 21 mg/dL — ABNORMAL HIGH (ref 6–20)
CALCIUM: 8.8 mg/dL — AB (ref 8.9–10.3)
CHLORIDE: 103 mmol/L (ref 101–111)
CO2: 23 mmol/L (ref 22–32)
Creatinine, Ser: 0.57 mg/dL (ref 0.44–1.00)
GFR calc non Af Amer: >60 mL/min (ref >60)
Glucose, Bld: 102 mg/dL — ABNORMAL HIGH (ref 65–99)
POTASSIUM: 4.2 mmol/L (ref 3.5–5.1)
Sodium: 136 mmol/L (ref 135–145)

## 2015-11-16 MED ORDER — OXYCODONE-ACETAMINOPHEN 5-325 MG PO TABS
1.0000 | ORAL_TABLET | ORAL | Status: DC | PRN
  Administered 2015-11-16: 1 via ORAL
  Filled 2015-11-16: qty 1

## 2015-11-16 MED ORDER — FLUCONAZOLE 200 MG PO TABS
200.0000 mg | ORAL_TABLET | Freq: Once | ORAL | Status: AC
  Administered 2015-11-16: 200 mg via ORAL
  Filled 2015-11-16: qty 1

## 2015-11-16 MED ORDER — SENNOSIDES-DOCUSATE SODIUM 8.6-50 MG PO TABS: 1.0000 | ORAL_TABLET | Freq: Two times a day (BID) | ORAL | Status: DC

## 2015-11-16 MED ORDER — KETOROLAC TROMETHAMINE 15 MG/ML IJ SOLN: 15.0000 mg | Freq: Once | INTRAMUSCULAR | Status: DC

## 2015-11-16 MED ORDER — SENNOSIDES-DOCUSATE SODIUM 8.6-50 MG PO TABS
1.0000 | ORAL_TABLET | Freq: Two times a day (BID) | ORAL | Status: DC
  Administered 2015-11-16: 1 via ORAL
  Filled 2015-11-16: qty 1

## 2015-11-16 MED ORDER — OXYCODONE-ACETAMINOPHEN 5-325 MG PO TABS: 1.0000 | ORAL_TABLET | ORAL | Status: DC | PRN

## 2015-11-16 MED ORDER — POLYETHYLENE GLYCOL 3350 17 G PO PACK
17.0000 g | PACK | Freq: Every day | ORAL | Status: DC
  Administered 2015-11-16: 17 g via ORAL
  Filled 2015-11-16: qty 1

## 2015-11-16 MED ORDER — ACETAMINOPHEN 500 MG PO TABS: 500.0000 mg | ORAL_TABLET | Freq: Four times a day (QID) | ORAL | Status: DC | PRN

## 2015-11-16 NOTE — Progress Notes (Signed)
Report called to nurse Ruthman at [facility] SN[facility]. All questions answered and nurse verbalized understanding. Patient dressed and awaiting transport by EMS. Daughter also waiting at bedside. VS stable and pain under control.

## 2015-11-16 NOTE — NC FL2 (Signed)
[place] MEDICAID FL2 LEVEL OF CARE SCREENING TOOL     IDENTIFICATION  Patient Name: [name] Birthdate: 09/30/1924 Sex: female Admission Date (Current Location): 11/14/2015  [facility] and [place] Number:  Producer, television/film/video and Address:  [facility],  [address] [place] [address]      Provider Number: [id number]  Attending Physician Name and Address:  Albertine Grates, MD  Relative Name and Phone Number:       Current Level of Care: Hospital Recommended Level of Care: Skilled Nursing Facility Prior Approval Number:    Date Approved/Denied:   PASRR Number: [id number]  Discharge Plan: SNF    Current Diagnoses: Patient Active Problem List   Diagnosis Date Noted  . SAH (subarachnoid hemorrhage) (HCC) 11/16/2015  . Diastolic dysfunction-grade 2 11/16/2015  . Wrist fracture 11/14/2015  . Fall 11/14/2015  . Essential hypertension   . Hypercholesteremia   . Aortic stenosis, severe   . Allergic rhinitis   . Arthritis   . Dyslipidemia   . Obesity     Orientation RESPIRATION BLADDER Height & Weight    Self, Time, Situation, Place  Normal Continent  (160 cm) 166 lbs.  BEHAVIORAL SYMPTOMS/MOOD NEUROLOGICAL BOWEL NUTRITION STATUS      Continent Diet (Heart Healthy)  AMBULATORY STATUS COMMUNICATION OF NEEDS Skin   Limited Assist Verbally Normal                       Personal Care Assistance Level of Assistance  Bathing, Dressing Bathing Assistance: Limited assistance   Dressing Assistance: Limited assistance     Functional Limitations Info             SPECIAL CARE FACTORS FREQUENCY  PT (By licensed PT), OT (By licensed OT)     PT Frequency: 5 OT Frequency: 5            Contractures      Additional Factors Info  Code Status, Allergies Code Status Info: DNR Allergies Info: NKDA           Current Medications (11/16/2015):  This is the current hospital active medication list Current Facility-Administered  Medications  Medication Dose Route Frequency Provider Last Rate Last Dose  . ezetimibe-simvastatin (VYTORIN) 10-20 MG per tablet 1 tablet  1 tablet Oral Daily Albertine Grates, MD   1 tablet at 11/16/15 1123  . latanoprost (XALATAN) 0.005 % ophthalmic solution 1 drop  1 drop Both Eyes QHS Albertine Grates, MD   1 drop at 11/15/15 2211  . metoprolol succinate (TOPROL-XL) 24 hr tablet 25 mg  25 mg Oral Daily Albertine Grates, MD   25 mg at 11/16/15 1122  . multivitamin with minerals tablet 1 tablet  1 tablet Oral Daily Albertine Grates, MD   1 tablet at 11/16/15 1124  . oxyCODONE-acetaminophen (PERCOCET/ROXICET) 5-325 MG per tablet 1 tablet  1 tablet Oral Q3H PRN Albertine Grates, MD      . polyethylene glycol (MIRALAX / GLYCOLAX) packet 17 g  17 g Oral Daily Albertine Grates, MD   17 g at 11/16/15 1126  . senna-docusate (Senokot-S) tablet 1 tablet  1 tablet Oral BID Albertine Grates, MD   1 tablet at 11/16/15 1124  . traMADol (ULTRAM) tablet 50 mg  50 mg Oral QID PRN Albertine Grates, MD   50 mg at 11/16/15 1110     Discharge Medications: Please see discharge summary for a list of discharge medications.  Relevant Imaging Results:  Relevant Lab Results:  Additional Information SSN: [id number]  Arlyss Repress, LCSW

## 2015-11-16 NOTE — Care Management Note (Signed)
Case Management Note  Patient Details  Name: LEARA RAWL MRN: [id number] Date of Birth: 1924-01-19  Subjective/Objective:                    Action/Plan:d/c SNF   Expected Discharge Date:   (UNKNOWN)               Expected Discharge Plan:  Skilled Nursing Facility  In-House Referral:  Clinical Social Work  Discharge planning Services  CM Consult  Post Acute Care Choice:    Choice offered to:     DME Arranged:    DME Agency:     HH Arranged:    HH Agency:     Status of Service:  Completed, signed off  Medicare Important Message Given:    Date Medicare IM Given:    Medicare IM give by:    Date Additional Medicare IM Given:    Additional Medicare Important Message give by:     If discussed at Long Length of Stay Meetings, dates discussed:    Additional Comments:  Lanier Clam, RN 11/16/2015, 3:00 PM

## 2015-11-16 NOTE — Progress Notes (Signed)
Physical Therapy Treatment Patient Details Name: [name] MRN: [id number] DOB: [birth year] Today's Date: 11/16/2015    History of Present Illness 82 yo female admitted with acute subarachnoid hemorrhage R parietal, R comminuted/impacted distal radius and ulnar fractures. Hx of aortic stenosis, HTN, OA, L rotator cuff injury, vertigo.    PT Comments    Pt is very pleasant and cooperative, motivated to work with therapy but mildly anxious regarding mobility; continue to recommend SNF  Follow Up Recommendations  SNF     Equipment Recommendations  None recommended by PT    Recommendations for Other Services       Precautions / Restrictions Precautions Precautions: Fall Required Braces or Orthoses: Sling Restrictions RUE Weight Bearing: Non weight bearing    Mobility  Bed Mobility   Bed Mobility: Supine to Sit     Supine to sit: Min assist     General bed mobility comments: HOB elevated 70*, min assist with trunk, cues for LE progression  Transfers Overall transfer level: Needs assistance Equipment used: 1 person hand held assist Transfers: Sit to/from [employer] Transfers Sit to Stand: Min assist;Min guard Stand pivot transfers: Min assist       General transfer comment: sit to stand x2 for strengthening/activity tol and instruction;  less assist required on 3rd trial; min for balance during pivotal steps  Ambulation/Gait             General Gait Details: pivotal steps to chair as pt is anxious with +1 assist   Stairs            Wheelchair Mobility    Modified Rankin (Stroke Patients Only)       Balance             Standing balance-Leahy Scale: Poor                      Cognition Arousal/Alertness: Awake/alert Behavior During Therapy: WFL for tasks assessed/performed Overall Cognitive Status: Within Functional Limits for tasks assessed Area of Impairment: Memory                    Exercises General  Exercises - Lower Extremity Ankle Circles/Pumps: AROM;15 reps;Both Quad Sets: AROM;Both;10 reps    General Comments        Pertinent Vitals/Pain Pain Assessment: No/denies pain    Home Living                      Prior Function            PT Goals (current goals can now be found in the care plan section) Acute Rehab PT Goals Patient Stated Goal: get back to being independent; be able to do things herself PT Goal Formulation: With patient/family Time For Goal Achievement: 11/29/15 Potential to Achieve Goals: Fair Progress towards PT goals: Progressing toward goals    Frequency  Min 3X/week    PT Plan Current plan remains appropriate    Co-evaluation             End of Session Equipment Utilized During Treatment: Gait belt Activity Tolerance: Patient tolerated treatment well Patient left: in chair;with call bell/phone within reach;with chair alarm set     Time: 1420-1438 PT Time Calculation (min) (ACUTE ONLY): 18 min  Charges:  $Therapeutic Activity: 8-22 mins                    G Codes:      [facility]  11/16/2015, 2:38 PM

## 2015-11-16 NOTE — Clinical Social Work Placement (Signed)
Patient is set to discharge to [facility] today. Patient & daughter, [name] aware. Discharge packet given to RN, Konrad Felix. [facility] called for transport.     Lincoln Maxin, LCSW [facility] Clinical Social Worker cell #: [phone]    CLINICAL SOCIAL WORK PLACEMENT  NOTE  Date:  11/16/2015  Patient Details  Name: [name] MRN: [id number] Date of Birth: 21-Jul-1924  Clinical Social Work is seeking post-discharge placement for this patient at the Skilled  Nursing Facility level of care (*CSW will initial, date and re-position this form in  chart as items are completed):  Yes   Patient/family provided with [place] Clinical Social Work Department's list of facilities offering this level of care within the geographic area requested by the patient (or if unable, by the patient's family).  Yes   Patient/family informed of their freedom to choose among providers that offer the needed level of care, that participate in Medicare, Medicaid or managed care program needed by the patient, have an available bed and are willing to accept the patient.  Yes   Patient/family informed of 's ownership interest in [facility] and [facility], as well as of the fact that they are under no obligation to receive care at these facilities.  PASRR submitted to EDS on 11/16/15     PASRR number received on 11/16/15     Existing PASRR number confirmed on       FL2 transmitted to all facilities in geographic area requested by pt/family on 11/16/15     FL2 transmitted to all facilities within larger geographic area on       Patient informed that his/her managed care company has contracts with or will negotiate with certain facilities, including the following:        Yes   Patient/family informed of bed offers received.  Patient chooses bed at [facility]     Physician recommends and patient chooses bed at      Patient to be transferred to  [facility] on 11/16/15.  Patient to be transferred to facility by [facility]     Patient family notified on 11/16/15 of transfer.  Name of family member notified:  patient's daughter, [name]     PHYSICIAN       Additional Comment:    _______________________________________________ Arlyss Repress, LCSW 11/16/2015, 3:09 PM

## 2015-11-16 NOTE — Consult Note (Signed)
Reason for Consult:   Syncope, AS  Requesting Physician: [facility] Primary Cardiologist Dr Mayford Knife  HPI:   [name] 82 y/o female, has lived in her own home for 13 yrs, her daughter lives nearby. She has a history of AS- moderate to severe in Nov 2014 with a mean gradient of 41 mmHg and a peak gradient of 65 mmHg with normal LVF. Her LOV was 07/06/15, she is scheduled for f/u in March. She was at preparing a dinner party for guests. She went to the front door and turned to go to the dining room and apparently fell. She had no prodrome of weakness or sensation of impending syncope. According to the daughter the event was witnessed and they did not feel she "blacked out".  She unfortunately suffered a Rt wrist fracture and SAH. She will require rehab before returning home.            The pt denies any history of DOE, exertional chest pain, near syncope, or syncope. Echo done 11/15/15 shows a mean gradient of 50 mmHg (no peak mentioned).   PMHx:  Past Medical History  Diagnosis Date  . Aortic valve disorders   . Allergic rhinitis   . Arthritis   . Dyslipidemia   . Obesity   . Glaucoma   . Hypercholesteremia   . Vertigo   . Hypertension     Past Surgical History  Procedure Laterality Date  . Appendectomy    . Shoulder surgery    . Bilataral cataracts removal with lens replacement dr. Darel Hong    . Cardiac catheterization      normal coronary arteries    SOCHx:  reports that she has never smoked. She has never used smokeless tobacco. She reports that she does not drink alcohol or use illicit drugs.  FAMHx: Family History  Problem Relation Age of Onset  . Heart disease Sister     ALLERGIES: No Known Allergies  ROS: Review of Systems: General: negative for chills, fever, night sweats or weight changes.  Cardiovascular: negative for chest pain, dyspnea on exertion, edema, orthopnea, palpitations, paroxysmal nocturnal dyspnea or shortness of breath HEENT:  negative for any visual disturbances, blindness, glaucoma Dermatological: negative for rash Respiratory: negative for cough, hemoptysis, or wheezing Urologic: negative for hematuria or dysuria Abdominal: negative for nausea, vomiting, diarrhea, bright red blood per rectum, melena, or hematemesis Neurologic: negative for visual changes, syncope, or dizziness Musculoskeletal: negative for back pain, joint pain, or swelling Psych: cooperative and appropriate All other systems reviewed and are otherwise negative except as noted above.   HOME MEDICATIONS: Prior to Admission medications   Medication Sig Start Date End Date Taking? Authorizing Provider  acetaminophen (TYLENOL) 500 MG tablet Take 1,000 mg by mouth every 6 (six) hours as needed for moderate pain or headache.   Yes Historical Provider, MD  aspirin 81 MG tablet Take 81 mg by mouth daily.   Yes Historical Provider, MD  atorvastatin (LIPITOR) 20 MG tablet Take 20 mg by mouth daily. 08/29/13  Yes Historical Provider, MD  ezetimibe-simvastatin (VYTORIN) 10-20 MG per tablet Take 1 tablet by mouth daily.   Yes Historical Provider, MD  metoprolol succinate (TOPROL-XL) 25 MG 24 hr tablet Take 1 tablet (25 mg total) by mouth daily. 07/17/15  Yes Brittainy Sherlynn Carbon, PA-C  Multiple Vitamin (MULTIVITAMIN WITH MINERALS) TABS tablet Take 1 tablet by mouth daily.   Yes Historical Provider, MD  traMADol (ULTRAM) 50 MG tablet Take 50 mg  by mouth 4 (four) times daily as needed for moderate pain.  11/05/15  Yes Historical Provider, MD  TRAVATAN Z 0.004 % SOLN ophthalmic solution Place 1 drop into both eyes at bedtime.  08/08/13  Yes Historical Provider, MD    HOSPITAL MEDICATIONS: I have reviewed the patient's current medications.  VITALS: Blood pressure 132/52, pulse 74, temperature 97.2 F (36.2 C), temperature source Oral, resp. rate 24, height 5\' 3"  (1.6 m), weight 166 lb 10.7 oz (75.6 kg), SpO2 95 %.  PHYSICAL EXAM: General appearance: alert,  cooperative, no distress and ecchymosis forhead and face Neck: no JVD and transmitted murmur Lungs: clear to auscultation bilaterally Heart: regular rate and rhythm and 2/6 systolic murmur LSB, AOV, faint S2 Abdomen: soft, non tender Extremities: no edema Pulses: 2+ and symmetric Skin: pale, warm, dry Neurologic: Grossly normal  LABS: Results for orders placed or performed during the hospital encounter of 11/14/15 (from the past 24 hour(s))  CBC     Status: Abnormal   Collection Time: 11/16/15  5:30 AM  Result Value Ref Range   WBC 12.9 (H) 4.0 - 10.5 K/uL   RBC 4.40 3.87 - 5.11 MIL/uL   Hemoglobin 12.8 12.0 - 15.0 g/dL   HCT 96.2 95.2 - 84.1 %   MCV 90.9 78.0 - 100.0 fL   MCH 29.1 26.0 - 34.0 pg   MCHC 32.0 30.0 - 36.0 g/dL   RDW 32.4 40.1 - 02.7 %   Platelets 207 150 - 400 K/uL  Basic metabolic panel     Status: Abnormal   Collection Time: 11/16/15  5:30 AM  Result Value Ref Range   Sodium 136 135 - 145 mmol/L   Potassium 4.2 3.5 - 5.1 mmol/L   Chloride 103 101 - 111 mmol/L   CO2 23 22 - 32 mmol/L   Glucose, Bld 102 (H) 65 - 99 mg/dL   BUN 21 (H) 6 - 20 mg/dL   Creatinine, Ser 2.53 0.44 - 1.00 mg/dL   Calcium 8.8 (L) 8.9 - 10.3 mg/dL   GFR calc non Af Amer >60 >60 mL/min   GFR calc Af Amer >60 >60 mL/min   Anion gap 10 5 - 15    EKG: Admission EKG from ED not available in EPIC  Echo: 11/15/15 Study Conclusions  - Left ventricle: The cavity size was normal. There was moderate focal basal hypertrophy of the septum. Systolic function was normal. The estimated ejection fraction was in the range of 50% to 55%. There appears to be an intra-cavitary gradient at rest. Max velocity 2.6 m/s. 27 mmHg. Wall motion was normal; there were no regional wall motion abnormalities. Doppler parameters are consistent with abnormal left ventricular relaxation (grade 1 diastolic dysfunction). Doppler parameters are consistent with high ventricular filling pressure. -  Aortic valve: Probably trileaflet but cannot rule out bicuspid with fusion of the right and left coronary cusps; severely thickened, severely calcified leaflets. Valve mobility was restricted. There was severe stenosis. There was moderate regurgitation. Peak velocity (S): 460 cm/s. Mean gradient (S): 50 mm Hg. Valve area (VTI): 0.84 cm^2. Valve area (Vmax): 0.74 cm^2. Valve area (Vmean): 0.79 cm^2. - Mitral valve: Severely calcified annulus. Transvalvular velocity was within the normal range. There was no evidence for stenosis. There was no regurgitation. Mean gradient (D): 3 mm Hg. Valve area by pressure half-time: 1.77 cm^2. Valve area by continuity equation (using LVOT flow): 1.92 cm^2. - Right ventricle: The cavity size was normal. Wall thickness was normal. Systolic function was normal. -  Pulmonary arteries: Systolic pressure was mildly increased. PA peak pressure: 37 mm Hg (S). - Inferior vena cava: The vessel was normal in size. The respirophasic diameter changes were blunted (< 50%), consistent with elevated central venous pressure.    IMAGING: Dg Wrist Complete Right  11/14/2015  CLINICAL DATA:  Pain following fall EXAM: RIGHT WRIST - COMPLETE 3+ VIEW COMPARISON:  None. FINDINGS:   lMPRESSION: Comminuted, impacted fracture of the distal radial metaphysis with volar angulation distally. Avulsion of the ulnar styloid. Extensive calcification in the triangular fibrocartilage, likely due to chronic tear in this region. 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 Extensive osteoarthritic change in the first carpal -metacarpal joint. Bones are osteoporotic. Electronically Signed   By: Bretta Bang III M.D.   On: 11/14/2015 14:44   Ct Head Wo Contrast  11/14/2015  CLINICAL DATA:  Fall EXAM: CT HEAD WITHOUT CONTRAST CT MAXILLOFACIAL WITHOUT CONTRAST  FINDINGS No evidence of facial bone fracture. Advanced degenerative and  chronic changes in the cervical spine. Soft tissue swelling is noted over the frontal bone and nasal bridge. No evidence of orbital hemorrhage. IMPRESSION: Acute subarachnoid hemorrhage in the right parietal region. No evidence of facial bone fracture. Critical Value/emergent results were called by telephone at the time of interpretation on 11/14/2015 at 3:12 pm to Dr. Nelva Nay , who verbally acknowledged these results. Electronically Signed   By: Jolaine Click M.D.   On: 11/14/2015 15:12   Dg Shoulder Left  11/14/2015  CLINICAL DATA:  Pain following fall EXAM: LEFT SHOULDER - 2+  IMPRESSION: Advanced generalized osteoarthritic change in the left shoulder with marked bony overgrowth and narrowing of the glenohumeral joint. No acute fracture or dislocation is evident. Suspect chronic rotator cuff tear given the loss of space between the acromion and humeral head. Note that there is bilateral carotid artery calcification. Electronically Signed   By: Bretta Bang III M.D.   On: 11/14/2015 14:42      IMPRESSION: Principal Problem:   Fall Active Problems:   Aortic stenosis, severe   Essential hypertension   Wrist fracture   SAH (subarachnoid hemorrhage) (HCC)   Diastolic dysfunction-grade 2   Dyslipidemia   RECOMMENDATION: It sounds like she had a mechanical fall as opposed to true syncope related to her AS. She will keep her f/u as scheduled and I spoke with the pt and her daughter about warning signs of critical AS- exertional chest pain or dyspnea, and near syncope/ syncope. She would appear to be a good candidate for TAVR , will defer referral timing to her primary cardiologist, Dr Mayford Knife.  Dr Herbie Baltimore to see today.   Time Spent Directly with Patient:  45 minutes  Corine Shelter, [place]  [phone] beeper 11/16/2015, 11:07 AM   I have seen, examined and evaluated the patient this PM along with Mr.Kilroy..  After reviewing all the available data and chart,  I agree with his findings,  examination as well as impression recommendations.  Very pleasant elderly woman with known Severe AS (worse by current Echo) who has not had any SSx associated with AS (i.e. CHF, Angina or Syncope) who was admitted after what truthfully sounds like a mechanical fall with multiple injuries. She states that she turned around from the door & probably slipped on the carpet then fell on the floor.  She denies LOC, palpitations or dizziness.  Cardiovascular ROS: no chest pain or dyspnea on exertion positive for - fall without LOC.  Has noted that her exercise  capacity has diminished over the past year or so -- used to walk with a walking club, but quit.  States that she tires easily.  Was considering it age relatred negative for - edema, irregular heartbeat, loss of consciousness, murmur, orthopnea, palpitations, paroxysmal nocturnal dyspnea, rapid heart rate, shortness of breath or TIA/Amaurosis Fugax  I do not suspect that her fall had anything to do with her AS. I think that she probably is somewhat symptomatic for her AS, but just is minimizing her symptoms.  She may well be a TAVR candidate given her age.  I would defer to her primary cardiologist for further decisions re: referral to [facility].  No further workup necessary at this point.    Marykay Lex, M.D., M.S. Interventional Cardiologist   Pager # [phone] Phone # [phone] [address] [place], [place] [address]

## 2015-11-16 NOTE — Progress Notes (Signed)
Occupational Therapy Treatment Patient Details Name: [name] MRN: [id number] DOB: 01/05/1924 Today's Date: 11/16/2015    History of present illness 82 yo female admitted with acute subarachnoid hemorrhage R parietal, R comminuted/impacted distal radius and ulnar fractures. Hx of aortic stenosis, HTN, OA, L rotator cuff injury, vertigo.   OT comments  Reviewed edema management:  Performed AROM/PROM and positioning.  Provided long bendable spoon but only simulated as food was not present.  This will be very positional and may assist with thick consistency such as mashed potatoes due to pt's inability to hold level  Follow Up Recommendations  SNF    Equipment Recommendations  None recommended by OT    Recommendations for Other Services      Precautions / Restrictions Precautions Precautions: Fall Restrictions Weight Bearing Restrictions: No RUE Weight Bearing: Non weight bearing       Mobility Bed Mobility                  Transfers                 General transfer comment: no toileting needs. Did not get OOB    Balance                                   ADL                                         General ADL Comments: re-educated on edema control; repositioned hand into ramped position and wiggling fingers. Gentle PROM performed.  Provided long bendable spoon--only simulated as food tray not present.  Pt may be able to use for mashed potatoes, if positioning can be adapted enough.  Daughter emotional about recommendation for SNF.  Talked to her about rationale around my recommendation and she was understanding and agreeable.  Pt has a sister in LTC at [facility]. She is hopeful that she can get in there      Vision                     Perception     Praxis      Cognition   Behavior During Therapy: [facility] for tasks assessed/performed Overall Cognitive Status: Within Functional Limits for tasks assessed                        Extremity/Trunk Assessment               Exercises     Shoulder Instructions       General Comments      Pertinent Vitals/ Pain       Pain Assessment: No/denies pain  Home Living                                          Prior Functioning/Environment              Frequency Min 3X/week     Progress Toward Goals  OT Goals(current goals can now be found in the care plan section)  Progress towards OT goals: Progressing toward goals     Plan      Co-evaluation  End of Session     Activity Tolerance Patient tolerated treatment well   Patient Left in bed;with call bell/phone within reach   Nurse Communication          Time: 1610-9604 OT Time Calculation (min): 30 min  Charges: OT General Charges $OT Visit: 1 Procedure OT Treatments $Therapeutic Activity: 8-22 mins  Ronni Osterberg 11/16/2015, 12:36 PM  Marica Otter, OTR/L [phone] 11/16/2015

## 2015-11-16 NOTE — Discharge Summary (Signed)
Discharge Summary  [name] BJY:[id number] DOB: [birth year]  PCP: Allean Found, MD  Admit date: 11/14/2015 Discharge date: 11/16/2015  Time spent: >91mins  Recommendations for Outpatient Follow-up:  1. F/u with orthopedics dr. Mina Marble for right wrist fracture 2. F/u with neurosurgery Dr. Marikay Alar for subarachnoid hemorrhage, Dr. Yetta Barre to advise on when to resume aspirin. 3. F/u with cardiology Dr. Mayford Knife for aortic stenosis 4. F/u with PMD within three weeks for hospital discharge follow up  Discharge Diagnoses:  Active Hospital Problems   Diagnosis Date Noted  . Fall 11/14/2015  . SAH (subarachnoid hemorrhage) (HCC) 11/16/2015  . Diastolic dysfunction-grade 2 11/16/2015  . Wrist fracture 11/14/2015  . Essential hypertension   . Aortic stenosis, severe   . Dyslipidemia     Resolved Hospital Problems   Diagnosis Date Noted Date Resolved  No resolved problems to display.    Discharge Condition: stable  Diet recommendation: heart healthy  Filed Weights   11/15/15 0311 11/16/15 0434  Weight: 72.6 kg (160 lb 0.9 oz) 75.6 kg (166 lb 10.7 oz)    History of present illness:  [name] is a 82 y.o. female  With h/o aortic stenosis, HTN, HLD, osteoarthritis presented to [facility] ED due to fall yesterday evening, now with significant bruising over her face on the left side and a large bruise on her left upper arm. Patient reported she has been having unsteadiness for the past year which she stopped driving, and also stopped going to church, but no prior falls except yesterday evening. She has been using a walker. She denies fever, chest pain, no sob, no palpitation, no dizziness, no tinnitus, no room spinning, no LOC, no diarrhea, no dysuria, weight has been stable last year.  Hospital Course:  Principal Problem:   Fall Active Problems:   Aortic stenosis, severe   Dyslipidemia   Essential hypertension   Wrist fracture   SAH (subarachnoid hemorrhage)  (HCC)   Diastolic dysfunction-grade 2  Comminuted distal radial and ulnar fractures: EDP Discussed case with Dr. Mina Marble from hand surgery who recommended a sugar tong splint and follow-up can be in his office in a week.  Acute subarachnoid hemorrhage in the right parietal region: EDP Discussed findings with CT scan of the head with Dr. Marikay Alar from neurosurgery who recommended conservative observation, per Dr Yetta Barre " would not repeat CT head" unless patient's has mental status or neurologic changes. Patient has been stable in the hospital, no new neurological symptoms, denies headache, her aspirin has been held since admission, patient is to follow up with neurosurgery , neurosurgery to advise on when to resume asa.  Fall/FTT: patient attribute the fall to her progressive unsteadiness, she denies LOC/chest pain/sob ,  SNF placement  Yeast under bilateral breast: s/p one dose of oral diflucan in the hospital, continue topical nystatin powder  Aortic stenosis: last seen by cardiology for this in 06/2015, patient denies associated symptom, denies chest pain, no dizziness, no sob, Echo with severe aortic stenosis, ekg/ tele unremarkable, cardiology consulted, input appreciated, patient is to follow up with cardiology Dr. Mayford Knife to discuss TAVR.   DVT prophylaxis: scd's  Consultants:  Hand surgery  neurosurgery  Code Status: DNR  Family Communication: Patient and daughter in room  Disposition Plan: snf 1/27  Procedures:  none  Consultations:  Hand surgery Dr Mina Marble  neurosurgery Dr. Marikay Alar  cardiology  Discharge Exam: BP 141/44 mmHg  Pulse 78  Temp(Src) 97.5 F (36.4 C) (Oral)  Resp 20  Ht  5\' 3"  (1.6 m)  Wt 75.6 kg (166 lb 10.7 oz)  BMI 29.53 kg/m2  SpO2 96%    General: NAD, facial bruising, more around her left eye  Eyes: PERRL  ENT: unremarkable  Neck: supple, no JVD  Cardiovascular: RRR, + 3/6 murmur  Respiratory: CTABL  Abdomen:  soft/ND/ND, positive bowel sounds  Skin: fungal rash under bilateral breast with odor  Musculoskeletal: No edema  Psychiatric: calm/cooperative  Neurologic: no focal findings    Discharge Instructions You were cared for by a hospitalist during your hospital stay. If you have any questions about your discharge medications or the care you received while you were in the hospital after you are discharged, you can call the unit and asked to speak with the hospitalist on call if the hospitalist that took care of you is not available. Once you are discharged, your primary care physician will handle any further medical issues. Please note that NO REFILLS for any discharge medications will be authorized once you are discharged, as it is imperative that you return to your primary care physician (or establish a relationship with a primary care physician if you do not have one) for your aftercare needs so that they can reassess your need for medications and monitor your lab values.  Discharge Instructions    Diet - low sodium heart healthy    Complete by:  As directed      Increase activity slowly    Complete by:  As directed             Medication List    STOP taking these medications        aspirin 81 MG tablet     atorvastatin 20 MG tablet  Commonly known as:  LIPITOR      TAKE these medications        acetaminophen 500 MG tablet  Commonly known as:  TYLENOL  Take 1 tablet (500 mg total) by mouth every 6 (six) hours as needed for moderate pain or headache.     ezetimibe-simvastatin 10-20 MG tablet  Commonly known as:  VYTORIN  Take 1 tablet by mouth daily.     metoprolol succinate 25 MG 24 hr tablet  Commonly known as:  TOPROL-XL  Take 1 tablet (25 mg total) by mouth daily.     multivitamin with minerals Tabs tablet  Take 1 tablet by mouth daily.     oxyCODONE-acetaminophen 5-325 MG tablet  Commonly known as:  PERCOCET/ROXICET  Take 1 tablet by mouth every 3 (three)  hours as needed for moderate pain or severe pain.     senna-docusate 8.6-50 MG tablet  Commonly known as:  Senokot-S  Take 1 tablet by mouth 2 (two) times daily.     traMADol 50 MG tablet  Commonly known as:  ULTRAM  Take 50 mg by mouth 4 (four) times daily as needed for moderate pain.     TRAVATAN Z 0.004 % Soln ophthalmic solution  Generic drug:  Travoprost (BAK Free)  Place 1 drop into both eyes at bedtime.       No Known Allergies     Follow-up Information    Follow up with Quintella Reichert, MD.   Specialty:  Cardiology   Why:  office will contact you with a follow up appointment   Contact information:   [address] [place] [address] [phone]       Follow up with Marlowe Shores, MD In 1 week.   Specialty:  Orthopedic Surgery   Why:  right wrist fracture   Contact information:   [address] [place] [address] [phone]       Follow up with Tia Alert, MD In 1 week.   Specialty:  Neurosurgery   Why:  subarachonoid hemorrhage, Dr Yetta Barre to advise on when to resume aspirin   Contact information:   [address] [place] [address] [phone]       Follow up with Allean Found, MD In 3 weeks.   Specialty:  Family Medicine   Why:  hospital discharge follow up   Contact information:   [address] [place] [address] [phone]        The results of significant diagnostics from this hospitalization (including imaging, microbiology, ancillary and laboratory) are listed below for reference.    Significant Diagnostic Studies: Dg Wrist Complete Right  11/14/2015  CLINICAL DATA:  Pain following fall EXAM: RIGHT WRIST - COMPLETE 3+ VIEW COMPARISON:  None. FINDINGS: Frontal, oblique, lateral, and ulnar deviation scaphoid images were obtained. There is a comminuted fracture of the distal radial metaphysis with impaction at the fracture site. There is volar angulation distally. There is  an avulsion of the ulnar styloid. There is generalized soft tissue swelling. Bones are osteoporotic. There is advanced osteoarthritic change in the first carpal -metacarpal joint. 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 There is extensive triangular fiber cartilage region calcification. IMPRESSION: Comminuted, impacted fracture of the distal radial metaphysis with volar angulation distally. Avulsion of the ulnar styloid. Extensive calcification in the triangular fibrocartilage, likely due to chronic tear in this region. 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 Extensive osteoarthritic change in the first carpal -metacarpal joint. Bones are osteoporotic. Electronically Signed   By: Bretta Bang III M.D.   On: 11/14/2015 14:44   Ct Head Wo Contrast  11/14/2015  CLINICAL DATA:  Fall EXAM: CT HEAD WITHOUT CONTRAST CT MAXILLOFACIAL WITHOUT CONTRAST TECHNIQUE: Multidetector CT imaging of the head and maxillofacial structures were performed using the standard protocol without intravenous contrast. Multiplanar CT image reconstructions of the maxillofacial structures were also generated. COMPARISON:  03/11/2005 FINDINGS: CT HEAD FINDINGS There is a small amount of subarachnoid hemorrhage in the right parietal lobe. Chronic ischemic changes are present in the periventricular white matter. There is soft tissue swelling over the frontal bone to the left of midline. No underlying skull fracture. There is fluid in the frontal sinuses. Mastoid air cells are clear. No midline shift. Ventricular system is within normal limits other than ex vacuo dilatation from atrophy. CT MAXILLOFACIAL FINDINGS No evidence of facial bone fracture. Advanced degenerative and chronic changes in the cervical spine. Soft tissue swelling is noted over the frontal bone and nasal bridge. No evidence of orbital hemorrhage. IMPRESSION: Acute subarachnoid hemorrhage in the  right parietal region. No evidence of facial bone fracture. Critical Value/emergent results were called by telephone at the time of interpretation on 11/14/2015 at 3:12 pm to Dr. Nelva Nay , who verbally acknowledged these results. Electronically Signed   By: Jolaine Click M.D.   On: 11/14/2015 15:12   Dg Shoulder Left  11/14/2015  CLINICAL DATA:  Pain following fall EXAM: LEFT SHOULDER - 2+ VIEW COMPARISON:  None. FINDINGS: Frontal and Y scapular views obtained. There is no appreciable fracture or dislocation. There is advanced osteoarthritic change in the left glenohumeral joint with marked bony remodeling in the glenohumeral joint. There  is moderately severe narrowing of the acromioclavicular joint. There is narrowing between the humeral head and acromion. No well-defined erosive change. There is calcification in the left and right carotid arteries. IMPRESSION: Advanced generalized osteoarthritic change in the left shoulder with marked bony overgrowth and narrowing of the glenohumeral joint. No acute fracture or dislocation is evident. Suspect chronic rotator cuff tear given the loss of space between the acromion and humeral head. Note that there is bilateral carotid artery calcification. Electronically Signed   By: Bretta Bang III M.D.   On: 11/14/2015 14:42   Dg Humerus Left  11/14/2015  CLINICAL DATA:  Status post fall.  Left arm pain. EXAM: LEFT HUMERUS - 2+ VIEW COMPARISON:  None. FINDINGS: No acute fracture or dislocation. Severe osteoarthritis of the left glenohumeral joint. No soft tissue abnormality. IMPRESSION: No acute osseous injury of the left humerus. Electronically Signed   By: Elige Ko   On: 11/14/2015 14:44   Dg Hand Complete Right  11/14/2015  CLINICAL DATA:  Fall yesterday with right hand and wrist pain, initial encounter EXAM: RIGHT HAND - COMPLETE 3+ VIEW COMPARISON:  None. FINDINGS: Comminuted fracture of the distal right radius is noted involving the radiocarpal  articulation. Impaction and some anterior angulation is noted at the fracture site. Degenerative changes in the interphalangeal joints as well as within the carpal bones are seen. Ulnar styloid fracture is noted as well. IMPRESSION: Comminuted distal radial and ulnar fractures Electronically Signed   By: Alcide Clever M.D.   On: 11/14/2015 14:43   Ct Maxillofacial Wo Cm  11/14/2015  CLINICAL DATA:  Fall EXAM: CT HEAD WITHOUT CONTRAST CT MAXILLOFACIAL WITHOUT CONTRAST TECHNIQUE: Multidetector CT imaging of the head and maxillofacial structures were performed using the standard protocol without intravenous contrast. Multiplanar CT image reconstructions of the maxillofacial structures were also generated. COMPARISON:  03/11/2005 FINDINGS: CT HEAD FINDINGS There is a small amount of subarachnoid hemorrhage in the right parietal lobe. Chronic ischemic changes are present in the periventricular white matter. There is soft tissue swelling over the frontal bone to the left of midline. No underlying skull fracture. There is fluid in the frontal sinuses. Mastoid air cells are clear. No midline shift. Ventricular system is within normal limits other than ex vacuo dilatation from atrophy. CT MAXILLOFACIAL FINDINGS No evidence of facial bone fracture. Advanced degenerative and chronic changes in the cervical spine. Soft tissue swelling is noted over the frontal bone and nasal bridge. No evidence of orbital hemorrhage. IMPRESSION: Acute subarachnoid hemorrhage in the right parietal region. No evidence of facial bone fracture. Critical Value/emergent results were called by telephone at the time of interpretation on 11/14/2015 at 3:12 pm to Dr. Nelva Nay , who verbally acknowledged these results. Electronically Signed   By: Jolaine Click M.D.   On: 11/14/2015 15:12    Microbiology: No results found for this or any previous visit (from the past 240 hour(s)).   Labs: Basic Metabolic Panel:  Recent Labs Lab  11/14/15 1601 11/14/15 1612 11/15/15 0552 11/16/15 0530  NA 140 141 138 136  K 4.4 4.3 4.0 4.2  CL 105 105 103 103  CO2 26  --  25 23  GLUCOSE 117* 111* 110* 102*  BUN 21*  CREATININE 0.54 0.60 0.53 0.57  CALCIUM 9.1  --  9.1 8.8*   Liver Function Tests:  Recent Labs Lab 11/15/15 0552  AST 24  ALT 15  ALKPHOS 64  BILITOT 0.9  PROT 6.5  ALBUMIN 3.6  No results for input(s): LIPASE, AMYLASE in the last 168 hours. No results for input(s): AMMONIA in the last 168 hours. CBC:  Recent Labs Lab 11/14/15 1601 11/14/15 1612 11/15/15 0552 11/16/15 0530  WBC 12.6*  --  13.1* 12.9*  NEUTROABS 8.2*  --   --   --   HGB 13.1 14.3 13.0 12.8  HCT 40.8 42.0 40.8 40.0  MCV 91.9  --  91.3 90.9  PLT 211  --  213 207   Cardiac Enzymes: No results for input(s): CKTOTAL, CKMB, CKMBINDEX, TROPONINI in the last 168 hours. BNP: BNP (last 3 results) No results for input(s): BNP in the last 8760 hours.  ProBNP (last 3 results) No results for input(s): PROBNP in the last 8760 hours.  CBG: No results for input(s): GLUCAP in the last 168 hours.     SignedAlbertine Grates MD, PhD  [facility] 11/16/2015, 2:14 PM

## 2015-11-21 ENCOUNTER — Emergency Department (HOSPITAL_COMMUNITY): Payer: Medicare Other

## 2015-11-21 ENCOUNTER — Encounter (HOSPITAL_COMMUNITY): Payer: Self-pay | Admitting: Radiology

## 2015-11-21 ENCOUNTER — Emergency Department (HOSPITAL_COMMUNITY)
Admission: EM | Admit: 2015-11-21 | Discharge: 2015-11-21 | Disposition: A | Discharge status: Home / Self Care | Payer: Medicare Other | Attending: Physician Assistant | Admitting: Physician Assistant

## 2015-11-21 DIAGNOSIS — Z23 Encounter for immunization: Secondary | ICD-10-CM | POA: Insufficient documentation

## 2015-11-21 DIAGNOSIS — E669 Obesity, unspecified: Secondary | ICD-10-CM | POA: Diagnosis not present

## 2015-11-21 DIAGNOSIS — S6991XA Unspecified injury of right wrist, hand and finger(s), initial encounter: Secondary | ICD-10-CM | POA: Insufficient documentation

## 2015-11-21 DIAGNOSIS — Z79899 Other long term (current) drug therapy: Secondary | ICD-10-CM | POA: Diagnosis not present

## 2015-11-21 DIAGNOSIS — S0081XA Abrasion of other part of head, initial encounter: Secondary | ICD-10-CM | POA: Insufficient documentation

## 2015-11-21 DIAGNOSIS — Y9389 Activity, other specified: Secondary | ICD-10-CM | POA: Insufficient documentation

## 2015-11-21 DIAGNOSIS — S0993XA Unspecified injury of face, initial encounter: Secondary | ICD-10-CM | POA: Diagnosis present

## 2015-11-21 DIAGNOSIS — S3993XA Unspecified injury of pelvis, initial encounter: Secondary | ICD-10-CM | POA: Diagnosis not present

## 2015-11-21 DIAGNOSIS — S6992XA Unspecified injury of left wrist, hand and finger(s), initial encounter: Secondary | ICD-10-CM | POA: Diagnosis not present

## 2015-11-21 DIAGNOSIS — H409 Unspecified glaucoma: Secondary | ICD-10-CM | POA: Insufficient documentation

## 2015-11-21 DIAGNOSIS — E785 Hyperlipidemia, unspecified: Secondary | ICD-10-CM | POA: Insufficient documentation

## 2015-11-21 DIAGNOSIS — S59902A Unspecified injury of left elbow, initial encounter: Secondary | ICD-10-CM | POA: Insufficient documentation

## 2015-11-21 DIAGNOSIS — W050XXA Fall from non-moving wheelchair, initial encounter: Secondary | ICD-10-CM | POA: Diagnosis not present

## 2015-11-21 DIAGNOSIS — Y92129 Unspecified place in nursing home as the place of occurrence of the external cause: Secondary | ICD-10-CM | POA: Diagnosis not present

## 2015-11-21 DIAGNOSIS — S4992XA Unspecified injury of left shoulder and upper arm, initial encounter: Secondary | ICD-10-CM | POA: Diagnosis not present

## 2015-11-21 DIAGNOSIS — R011 Cardiac murmur, unspecified: Secondary | ICD-10-CM | POA: Insufficient documentation

## 2015-11-21 DIAGNOSIS — Z9889 Other specified postprocedural states: Secondary | ICD-10-CM | POA: Insufficient documentation

## 2015-11-21 DIAGNOSIS — S8991XA Unspecified injury of right lower leg, initial encounter: Secondary | ICD-10-CM | POA: Insufficient documentation

## 2015-11-21 DIAGNOSIS — Y998 Other external cause status: Secondary | ICD-10-CM | POA: Diagnosis not present

## 2015-11-21 DIAGNOSIS — E7800 Pure hypercholesterolemia, unspecified: Secondary | ICD-10-CM | POA: Insufficient documentation

## 2015-11-21 DIAGNOSIS — M1990 Unspecified osteoarthritis, unspecified site: Secondary | ICD-10-CM | POA: Insufficient documentation

## 2015-11-21 DIAGNOSIS — S0083XA Contusion of other part of head, initial encounter: Secondary | ICD-10-CM | POA: Diagnosis not present

## 2015-11-21 DIAGNOSIS — I10 Essential (primary) hypertension: Secondary | ICD-10-CM | POA: Diagnosis not present

## 2015-11-21 DIAGNOSIS — W19XXXA Unspecified fall, initial encounter: Secondary | ICD-10-CM

## 2015-11-21 LAB — URINALYSIS, ROUTINE W REFLEX MICROSCOPIC
Bilirubin Urine: NEGATIVE
GLUCOSE, UA: NEGATIVE mg/dL
HGB URINE DIPSTICK: NEGATIVE
Ketones, ur: 15 mg/dL — AB
Leukocytes, UA: NEGATIVE
Nitrite: NEGATIVE
PH: 7 (ref 5.0–8.0)
PROTEIN: NEGATIVE mg/dL
Specific Gravity, Urine: 1.019 (ref 1.005–1.030)

## 2015-11-21 MED ORDER — TETANUS-DIPHTH-ACELL PERTUSSIS 5-2.5-18.5 LF-MCG/0.5 IM SUSP
0.5000 mL | Freq: Once | INTRAMUSCULAR | Status: AC
  Administered 2015-11-21: 0.5 mL via INTRAMUSCULAR
  Filled 2015-11-21: qty 0.5

## 2015-11-21 MED ORDER — LIDOCAINE HCL (PF) 1 % IJ SOLN
30.0000 mL | Freq: Once | INTRAMUSCULAR | Status: AC
  Administered 2015-11-21: 30 mL
  Filled 2015-11-21: qty 30

## 2015-11-21 MED ORDER — ACETAMINOPHEN 325 MG PO TABS
650.0000 mg | ORAL_TABLET | Freq: Once | ORAL | Status: AC
  Administered 2015-11-21: 650 mg via ORAL
  Filled 2015-11-21: qty 2

## 2015-11-21 NOTE — Progress Notes (Addendum)
CSW staffed with nurse. CSW attempted to speak with patient. Patient reports she needed to use the restroom. CSW informed admission staff, who called through headset to inform staff.   Elenore Paddy [phone] ED CSW 11/21/2015 9:49 AM

## 2015-11-21 NOTE — Progress Notes (Signed)
[facility] ED CM consulted by ED unit secretary and ED charge RN for CM consult for pt ? Concern CM spoke with pt's daughter who proceeded to tell Cm about her staying with pt in [place] since pt d/c from hospital on 11/16/15 because she was concern with pt safety while at snf.  Daughter states she was approached by a Film/video editor member who voiced concern about daughter's hygiene and request daughter "go home" Daughter states that on this same day she was approached "she fell"  Pt noted to be dozing in and out with response of confusion during time spent speaking with daughter- ED RN present at pt bedside with attempt to provided d/c instructions and info  Cm requested ED RN call ED SW for assistance CM discussed with daughter that her concerns can be voiced to the director of the facility and the Long-Term Care Ombudsman staff Cm spoke with ED SW Obtained Ombudsman contact information and an article explaining their services to give to the daughter Cm called and spoke with wanda at [facility] to be informed the director name is "Shanda Bumps" Burna Mortimer transferred Cm x 2 to a voice message indicating the [facility] staff development team not Interior and spatial designer. No voice message left by Cm. CM updated ED SW about this and that daughter was provided "[place]" as Interior and spatial designer name. Daughter had informed CM she had issues with "them telling me the truth too" (related to the name of the director) CM discussed with daughter that if pt was at snf for "short term rehab" that she has a few weeks of therapy that needed to be used for the pt's benefit of improvement. Cm discussed daughter option of speaking with her sister to discuss having pt at the facility of their preference to continue therapy with the assist of the [place] SW or the SW at a preferred snf - Daughter voiced understanding

## 2015-11-21 NOTE — ED Notes (Signed)
Pt's arms are swollen and bruised with severe pain and bilateral ortho abnormalities; unable to obtain labs at this time.

## 2015-11-21 NOTE — ED Notes (Signed)
Pt requesting pain medication.  MD notified.

## 2015-11-21 NOTE — ED Notes (Signed)
Per EMS- Pt from [facility]  c/o pain to left arm after falling out of wheelchair while sleeping. No LOC. Laceration to right knee and forehead. Previous fx on left arm from fall. Hx of repeated falls. No bloodthinner use.

## 2015-11-21 NOTE — ED Notes (Signed)
Left msg for case management to speak with family per Dr Corlis Leak.

## 2015-11-21 NOTE — ED Notes (Signed)
Social work called for consult for assistance with placement. Patient's family would like to change nursing home. Pt currently at [name].

## 2015-11-21 NOTE — ED Notes (Signed)
Pt is alert and oriented, reporting pain as 8/10 after her fall.  Severe bruising noted to face; no acute distress.

## 2015-11-21 NOTE — ED Notes (Signed)
Bed: [facility] Expected date:  Expected time:  Means of arrival:  Comments: 

## 2015-11-21 NOTE — Progress Notes (Addendum)
CSW staffed with Nurse CM regarding patient. Nurse CM reports patient will be sent back to [facility]. Nurse CM is providing family with information on Ombudsman.  CSW was informed by Nurse CM that no one would provide to her the name of the Interior and spatial designer or social worker at [facility]. CSW will call the agency in efforts of obtaining the name of the director or social worker to provide to patients daughter.  CSW called [facility] and obtained the name of the Director, Leonides Schanz. CSW wrote down the name of the director and contact number [phone] to give to patient's daughter. Daughter was appreciative of the information.  [name] [phone] ED CSW 11/21/2015 1:16 PM

## 2015-11-21 NOTE — ED Notes (Signed)
Still unable to obtain labs; phlebotomy notified.

## 2015-11-21 NOTE — Discharge Instructions (Signed)
Patient had a laceration repaired on her right knee. She will need the stitches taken out in 10 days. Patient also has fracture to left upper arm and right wrist. Please keep splint on until follow-up with orthopedics.

## 2015-11-21 NOTE — ED Provider Notes (Signed)
CSN: [id number]     Arrival date & time 11/21/15  0645 History   First MD Initiated Contact with Patient 11/21/15 [phone]     Chief Complaint  Patient presents with  . Fall     (Consider location/radiation/quality/duration/timing/severity/associated sxs/prior Treatment) HPI   Patient is a 82 year old female with dementia presenting after fall today. Patient had a fall 5 days ago and diagnosed with a subarachnoid hemorrhage as well as Colles' fracture of the right wrist. Today patient unable to give good history however she presents with both old and new bruising.  Patient is difficult to assess given that she's got old pain and bruising from recent fall with overlying new fall injuries.   Level V caveat dementia/confusion.  Past Medical History  Diagnosis Date  . Aortic valve disorders   . Allergic rhinitis   . Arthritis   . Dyslipidemia   . Obesity   . Glaucoma   . Hypercholesteremia   . Vertigo   . Hypertension    Past Surgical History  Procedure Laterality Date  . Appendectomy    . Shoulder surgery    . Bilataral cataracts removal with lens replacement dr. Darel Hong    . Cardiac catheterization      normal coronary arteries   Family History  Problem Relation Age of Onset  . Heart disease Sister    Social History  Substance Use Topics  . Smoking status: Never Smoker   . Smokeless tobacco: Never Used  . Alcohol Use: No   OB History    No data available     Review of Systems  Unable to perform ROS: Mental status change      Allergies  Review of patient's allergies indicates no known allergies.  Home Medications   Prior to Admission medications   Medication Sig Start Date End Date Taking? Authorizing Provider  acetaminophen (TYLENOL) 500 MG tablet Take 1 tablet (500 mg total) by mouth every 6 (six) hours as needed for moderate pain or headache. 11/16/15   Albertine Grates, MD  ezetimibe-simvastatin (VYTORIN) 10-20 MG per tablet Take 1 tablet by mouth daily.     Historical Provider, MD  metoprolol succinate (TOPROL-XL) 25 MG 24 hr tablet Take 1 tablet (25 mg total) by mouth daily. 07/17/15   Brittainy Sherlynn Carbon, PA-C  Multiple Vitamin (MULTIVITAMIN WITH MINERALS) TABS tablet Take 1 tablet by mouth daily.    Historical Provider, MD  oxyCODONE-acetaminophen (PERCOCET/ROXICET) 5-325 MG tablet Take 1 tablet by mouth every 3 (three) hours as needed for moderate pain or severe pain. 11/16/15   Albertine Grates, MD  senna-docusate (SENOKOT-S) 8.6-50 MG tablet Take 1 tablet by mouth 2 (two) times daily. 11/16/15   Albertine Grates, MD  traMADol (ULTRAM) 50 MG tablet Take 50 mg by mouth 4 (four) times daily as needed for moderate pain.  11/05/15   Historical Provider, MD  TRAVATAN Z 0.004 % SOLN ophthalmic solution Place 1 drop into both eyes at bedtime.  08/08/13   Historical Provider, MD   BP 166/67 mmHg  Pulse 90  Temp(Src) 97.7 F (36.5 C) (Oral)  Resp 17  SpO2 96% Physical Exam  Constitutional: She appears well-developed and well-nourished.  HENT:  Head: Normocephalic and atraumatic.  Diffuse bruising to face. Small new abrasion to right forehead.  Eyes: Conjunctivae are normal. Right eye exhibits no discharge.  Neck: Neck supple.  Cardiovascular: Normal rate and regular rhythm.   Murmur heard. Pulmonary/Chest: Effort normal and breath sounds normal. She has no wheezes. She has no  rales.  No pain to chest wall.  Abdominal: Soft. She exhibits no distension. There is no tenderness.  No pain or bruising to abdominal.  Musculoskeletal: Normal range of motion. She exhibits no edema.  Pain to palpation of left shoulder, left elbow, left wrist, right wrist, right knee, pelvis.    Neurological: No cranial nerve deficit.  Skin: Skin is warm and dry. No rash noted. She is not diaphoretic.  Psychiatric: Judgment normal.  Nursing note and vitals reviewed.   ED Course  Procedures (including critical care time) Labs Review Labs Reviewed - No data to display  Imaging  Review No results found. I have personally reviewed and evaluated these images and lab results as part of my medical decision-making.   EKG Interpretation None      MDM   Final diagnoses:  None    Patient's elderly 82 year old female with recent fall and new fall today. Patient had a subarachnoid hemorrhage of right Colles' fracture on last visit 5 days ago. Of note patient had a right Colles' fracture on the right hand wrist. However she appeared today wearing a left-sided wrist brace.   We will do CT head and neck given patient's age and recent subarachnoid hemorrhage.  We will do x-rays of every limb with pain patient is unable tell us clearly what is new and old pain.  We'll plan to repair laceration to right knee.  LACERATION REPAIR Performed by: Arlana Hove Authorized by: Arlana Hove Consent: Verbal consent obtained. Risks and benefits: risks, benefits and alternatives were discussed Consent given by: patient Patient identity confirmed: provided demographic data Prepped and Draped in normal sterile fashion Wound explored  Laceration Location: R knee  Laceration Length: 9 cm  No Foreign Bodies seen or palpated  Anesthesia: local infiltration  Local anesthetic: lidocaine 1% w epinephrine  Anesthetic total: 9  ml  Irrigation method: syringe Amount of cleaning: standard  Skin closure: 3-0 vicryl X 3 deep 3-0 Nylon X 6 uninterupted  Patient tolerance: Patient tolerated the procedure well with no immediate complications.   10:28 AM Called patient's daughter, Lynden Ang [phone].  At the nursing home patient's other daughter was asked to leave by nursing home staff. This apparently upset the patient greatly and Lynden Ang thinks that this is what is causing her to be disoriented, the emotional stress. Last night was the first night that her mom had stayed alone at the NH alone and she had a mechanical fall out of the wheelchair this am.    10:38  AM Also talked to ortho who said she left the office yesterday with two wplints,one on each arm.  Will replace splint now.    UA ordered because patient seems more altered than how ortho saw her yesterday.  In talking to family, she has become increasingly confused over several days/week.   12:18 PM Family here. Patient improved. Will discharge  Jermon Chalfant Randall An, MD 11/21/15 1218

## 2015-11-21 NOTE — ED Notes (Addendum)
Pt is currently with ortho tech receiving splint to left wrist.  Her daughter [name] is at the bedside.  No acute distress noted.

## 2015-11-21 NOTE — ED Notes (Signed)
Pt has a 1" skin tear to left elbow; tegaderm dressing applied.  There is a large tear/laceration to the outer aspect of the right knee, approximately 4" in length with adipose tissue visible on inspection.  ABD pad applied by EMS was saturated on assessment, so it has been removed and replaced with 4x4 gauze.  Pt used a bedpan and c/o severe left wrist and arm pain when turning.  There was blood on her right sock but no injury on assessment; new socks applied. No additional injuries noted.

## 2015-11-21 NOTE — ED Notes (Signed)
Bed: ZO10 Expected date:  Expected time:  Means of arrival:  Comments: 82 yo F  Fall

## 2015-11-22 LAB — URINE CULTURE

## 2015-12-17 ENCOUNTER — Emergency Department (HOSPITAL_COMMUNITY): Payer: Medicare Other

## 2015-12-17 ENCOUNTER — Encounter (HOSPITAL_COMMUNITY): Payer: Self-pay | Admitting: Emergency Medicine

## 2015-12-17 ENCOUNTER — Inpatient Hospital Stay (HOSPITAL_COMMUNITY)
Admission: EM | Admit: 2015-12-17 | Discharge: 2015-12-25 | DRG: 176 | Disposition: A | Discharge status: Skilled Nursing Facility | Payer: Medicare Other | Attending: Internal Medicine | Admitting: Internal Medicine

## 2015-12-17 DIAGNOSIS — S42302A Unspecified fracture of shaft of humerus, left arm, initial encounter for closed fracture: Secondary | ICD-10-CM | POA: Diagnosis not present

## 2015-12-17 DIAGNOSIS — D649 Anemia, unspecified: Secondary | ICD-10-CM | POA: Diagnosis present

## 2015-12-17 DIAGNOSIS — E7800 Pure hypercholesterolemia, unspecified: Secondary | ICD-10-CM | POA: Diagnosis present

## 2015-12-17 DIAGNOSIS — Z961 Presence of intraocular lens: Secondary | ICD-10-CM | POA: Diagnosis present

## 2015-12-17 DIAGNOSIS — Z9841 Cataract extraction status, right eye: Secondary | ICD-10-CM

## 2015-12-17 DIAGNOSIS — Z9181 History of falling: Secondary | ICD-10-CM

## 2015-12-17 DIAGNOSIS — M1990 Unspecified osteoarthritis, unspecified site: Secondary | ICD-10-CM | POA: Diagnosis present

## 2015-12-17 DIAGNOSIS — I10 Essential (primary) hypertension: Secondary | ICD-10-CM | POA: Diagnosis present

## 2015-12-17 DIAGNOSIS — G9340 Encephalopathy, unspecified: Secondary | ICD-10-CM | POA: Diagnosis not present

## 2015-12-17 DIAGNOSIS — Z7982 Long term (current) use of aspirin: Secondary | ICD-10-CM

## 2015-12-17 DIAGNOSIS — E785 Hyperlipidemia, unspecified: Secondary | ICD-10-CM | POA: Diagnosis present

## 2015-12-17 DIAGNOSIS — I272 Other secondary pulmonary hypertension: Secondary | ICD-10-CM | POA: Diagnosis present

## 2015-12-17 DIAGNOSIS — R0600 Dyspnea, unspecified: Secondary | ICD-10-CM

## 2015-12-17 DIAGNOSIS — R52 Pain, unspecified: Secondary | ICD-10-CM

## 2015-12-17 DIAGNOSIS — Z66 Do not resuscitate: Secondary | ICD-10-CM | POA: Diagnosis present

## 2015-12-17 DIAGNOSIS — Z9842 Cataract extraction status, left eye: Secondary | ICD-10-CM

## 2015-12-17 DIAGNOSIS — I350 Nonrheumatic aortic (valve) stenosis: Secondary | ICD-10-CM

## 2015-12-17 DIAGNOSIS — Z8249 Family history of ischemic heart disease and other diseases of the circulatory system: Secondary | ICD-10-CM

## 2015-12-17 DIAGNOSIS — M19012 Primary osteoarthritis, left shoulder: Secondary | ICD-10-CM | POA: Diagnosis present

## 2015-12-17 DIAGNOSIS — I2699 Other pulmonary embolism without acute cor pulmonale: Principal | ICD-10-CM | POA: Diagnosis present

## 2015-12-17 DIAGNOSIS — S42212A Unspecified displaced fracture of surgical neck of left humerus, initial encounter for closed fracture: Secondary | ICD-10-CM | POA: Diagnosis present

## 2015-12-17 DIAGNOSIS — S52531A Colles' fracture of right radius, initial encounter for closed fracture: Secondary | ICD-10-CM | POA: Diagnosis present

## 2015-12-17 DIAGNOSIS — M7989 Other specified soft tissue disorders: Secondary | ICD-10-CM

## 2015-12-17 DIAGNOSIS — R0602 Shortness of breath: Secondary | ICD-10-CM | POA: Diagnosis not present

## 2015-12-17 DIAGNOSIS — I609 Nontraumatic subarachnoid hemorrhage, unspecified: Secondary | ICD-10-CM

## 2015-12-17 DIAGNOSIS — I5032 Chronic diastolic (congestive) heart failure: Secondary | ICD-10-CM | POA: Diagnosis present

## 2015-12-17 DIAGNOSIS — I5189 Other ill-defined heart diseases: Secondary | ICD-10-CM | POA: Diagnosis present

## 2015-12-17 DIAGNOSIS — H409 Unspecified glaucoma: Secondary | ICD-10-CM | POA: Diagnosis present

## 2015-12-17 DIAGNOSIS — R1310 Dysphagia, unspecified: Secondary | ICD-10-CM | POA: Diagnosis present

## 2015-12-17 DIAGNOSIS — Z87820 Personal history of traumatic brain injury: Secondary | ICD-10-CM

## 2015-12-17 DIAGNOSIS — W19XXXA Unspecified fall, initial encounter: Secondary | ICD-10-CM | POA: Diagnosis present

## 2015-12-17 DIAGNOSIS — Z79899 Other long term (current) drug therapy: Secondary | ICD-10-CM

## 2015-12-17 DIAGNOSIS — S42309A Unspecified fracture of shaft of humerus, unspecified arm, initial encounter for closed fracture: Secondary | ICD-10-CM | POA: Diagnosis present

## 2015-12-17 DIAGNOSIS — I82401 Acute embolism and thrombosis of unspecified deep veins of right lower extremity: Secondary | ICD-10-CM | POA: Diagnosis present

## 2015-12-17 DIAGNOSIS — I519 Heart disease, unspecified: Secondary | ICD-10-CM | POA: Diagnosis not present

## 2015-12-17 LAB — BASIC METABOLIC PANEL
ANION GAP: 9 (ref 5–15)
BUN: 16 mg/dL (ref 6–20)
CALCIUM: 8.8 mg/dL — AB (ref 8.9–10.3)
CHLORIDE: 106 mmol/L (ref 101–111)
CO2: 24 mmol/L (ref 22–32)
Creatinine, Ser: 0.49 mg/dL (ref 0.44–1.00)
GFR calc non Af Amer: >60 mL/min (ref >60)
GLUCOSE: 91 mg/dL (ref 65–99)
POTASSIUM: 3.8 mmol/L (ref 3.5–5.1)
Sodium: 139 mmol/L (ref 135–145)

## 2015-12-17 LAB — CBC WITH DIFFERENTIAL/PLATELET
BASOS ABS: 0 10*3/uL (ref 0.0–0.1)
Basophils Relative: 0 %
EOS PCT: 2 %
Eosinophils Absolute: 0.2 10*3/uL (ref 0.0–0.7)
HCT: 38.8 % (ref 36.0–46.0)
HEMOGLOBIN: 12.3 g/dL (ref 12.0–15.0)
LYMPHS ABS: 2.8 10*3/uL (ref 0.7–4.0)
LYMPHS PCT: 30 %
MCH: 28.7 pg (ref 26.0–34.0)
MCHC: 31.7 g/dL (ref 30.0–36.0)
MCV: 90.7 fL (ref 78.0–100.0)
Monocytes Absolute: 0.7 10*3/uL (ref 0.1–1.0)
Monocytes Relative: 7 %
NEUTROS ABS: 5.9 10*3/uL (ref 1.7–7.7)
NEUTROS PCT: 61 %
PLATELETS: 223 10*3/uL (ref 150–400)
RBC: 4.28 MIL/uL (ref 3.87–5.11)
RDW: 15 % (ref 11.5–15.5)
WBC: 9.6 10*3/uL (ref 4.0–10.5)

## 2015-12-17 LAB — TROPONIN I: Troponin I: <0.03 ng/mL (ref <0.031)

## 2015-12-17 LAB — BRAIN NATRIURETIC PEPTIDE: B NATRIURETIC PEPTIDE 5: 86.1 pg/mL (ref 0.0–100.0)

## 2015-12-17 MED ORDER — MORPHINE SULFATE (PF) 2 MG/ML IV SOLN
2.0000 mg | Freq: Once | INTRAVENOUS | Status: AC
  Administered 2015-12-17: 2 mg via INTRAVENOUS
  Filled 2015-12-17: qty 1

## 2015-12-17 MED ORDER — OXYCODONE-ACETAMINOPHEN 5-325 MG PO TABS
1.0000 | ORAL_TABLET | Freq: Once | ORAL | Status: AC
  Administered 2015-12-17: 1 via ORAL
  Filled 2015-12-17: qty 1

## 2015-12-17 MED ORDER — IOHEXOL 350 MG/ML SOLN
100.0000 mL | Freq: Once | INTRAVENOUS | Status: AC | PRN
  Administered 2015-12-17: 100 mL via INTRAVENOUS

## 2015-12-17 NOTE — Progress Notes (Signed)
EDCM spoke to patient's daughter [name] at bedside.  Patient lives alone at home in a one story home.  Patient recently discharged from [name].  Patient's daughter reports [name] has arranged for home health services, "But they realy haven't been doing what they said they would do."  Patient's daughter reports she and her sister take turns taking care of the patient.  Patient's daughter reports patient has spent 20 days at [name] returned three days ago.  Patient now with fracture to left humerus with fracture to right wrist in Februrary.  Patient's daughter reports she would like the patient to return home.  [facility] will ask patient about discharge planning when she returns from testing.    2130pm  EDCM went to speak to patient at bedside, however, MD at bedside.

## 2015-12-17 NOTE — ED Provider Notes (Signed)
CSN: [id number]     Arrival date & time 12/17/15  1339 History   First MD Initiated Contact with Patient 12/17/15 1504     Chief Complaint  Patient presents with  . Shortness of Breath  . Arm Pain    left     (Consider location/radiation/quality/duration/timing/severity/associated sxs/prior Treatment) Patient is a 82 y.o. female presenting with shortness of breath and arm pain.  Shortness of Breath Arm Pain Associated symptoms include shortness of breath.   Patient left [facility] 3 days ago. Yesterday PM noted shortness of breath reported to family. Seemed somewhat improved but this morning returned. At this time, patient doesn't recall SOB but states it feels like she can't swallow. Ate breakfast and drank liquids without choking. Per patient and her daughter, the feeling of being unable to swallow started in the ED. No cough, fever or chest pain. Has had chronically swollen lower extremities since stay at [facility]. When medics assisting for transport today, patient heard a pop in left arm and severe pain below shoulder. Pre-existing right wrist fracture from previous fall. Past Medical History  Diagnosis Date  . Aortic valve disorders   . Allergic rhinitis   . Arthritis   . Dyslipidemia   . Obesity   . Glaucoma   . Hypercholesteremia   . Vertigo   . Hypertension    Past Surgical History  Procedure Laterality Date  . Appendectomy    . Shoulder surgery    . Bilataral cataracts removal with lens replacement dr. Darel Hong    . Cardiac catheterization      normal coronary arteries   Family History  Problem Relation Age of Onset  . Heart disease Sister   . Stroke Neg Hx   . CAD Neg Hx   . Cancer Neg Hx    Social History  Substance Use Topics  . Smoking status: Never Smoker   . Smokeless tobacco: Never Used  . Alcohol Use: No   OB History    No data available     Review of Systems  Respiratory: Positive for shortness of breath.    10 Systems reviewed and are  negative for acute change except as noted in the HPI.   Allergies  Review of patient's allergies indicates no known allergies.  Home Medications   Prior to Admission medications   Medication Sig Start Date End Date Taking? Authorizing Provider  acetaminophen (TYLENOL) 500 MG tablet Take 1 tablet (500 mg total) by mouth every 6 (six) hours as needed for moderate pain or headache. 11/16/15  Yes Albertine Grates, MD  aspirin EC 81 MG tablet Take 81 mg by mouth daily.   Yes Historical Provider, MD  ezetimibe-simvastatin (VYTORIN) 10-20 MG per tablet Take 1 tablet by mouth daily.   Yes Historical Provider, MD  metoprolol succinate (TOPROL-XL) 25 MG 24 hr tablet Take 1 tablet (25 mg total) by mouth daily. 07/17/15  Yes Brittainy Sherlynn Carbon, PA-C  Multiple Vitamin (MULTIVITAMIN WITH MINERALS) TABS tablet Take 1 tablet by mouth daily.   Yes Historical Provider, MD  oxyCODONE-acetaminophen (PERCOCET/ROXICET) 5-325 MG tablet Take 1 tablet by mouth every 3 (three) hours as needed for moderate pain or severe pain. 11/16/15  Yes Albertine Grates, MD  polyethylene glycol (MIRALAX / GLYCOLAX) packet Take 17 g by mouth daily.   Yes Historical Provider, MD  senna-docusate (SENOKOT-S) 8.6-50 MG tablet Take 1 tablet by mouth 2 (two) times daily. 11/16/15  Yes Albertine Grates, MD  TRAVATAN Z 0.004 % SOLN ophthalmic solution Place  1 drop into both eyes at bedtime.  08/08/13  Yes Historical Provider, MD   BP 188/50 mmHg  Pulse 92  Temp(Src) 97.9 F (36.6 C) (Oral)  Resp 20  SpO2 94% Physical Exam  Constitutional:  Alert and pleasant. No acute respiratory distress. Very deconditioned. Anxious.  HENT:  Head: Normocephalic and atraumatic.  Nose: Nose normal.  Eyes: EOM are normal. Pupils are equal, round, and reactive to light.  Neck: Neck supple.  Cardiovascular: Normal rate and regular rhythm.   Murmur heard. 4/5 SEM loudest at rt upper strenal border.  Pulmonary/Chest: Effort normal and breath sounds normal. No stridor.   Abdominal: Soft. She exhibits no distension. There is no tenderness.  Musculoskeletal: She exhibits edema and tenderness.  Pain left arm with any attempt ROM upper arm. Splint rt wrist. B/L LE 3+ edema  Neurological: She is alert.  Patient is alert and cooperative but has poor recall. Follows commands.  Skin: Skin is warm and dry.  Psychiatric:  anxious    ED Course  Procedures (including critical care time) Angiocath insertion Performed by: Arby Barrette  Consent: Verbal consent obtained. Risks and benefits: risks, benefits and alternatives were discussed Time out: Immediately prior to procedure a "time out" was called to verify the correct patient, procedure, equipment, support staff and site/side marked as required.  Preparation: Patient was prepped and draped in the usual sterile fashion.  Vein Location: right basilic  Ultrasound Guided  Gauge: 20  Normal blood return and flush without difficulty Patient tolerance: Patient tolerated the procedure well with no immediate complications.   Labs Review Labs Reviewed  BASIC METABOLIC PANEL - Abnormal; Notable for the following:    Calcium 8.8 (*)    All other components within normal limits  TROPONIN I  BRAIN NATRIURETIC PEPTIDE  CBC WITH DIFFERENTIAL/PLATELET  URINALYSIS, ROUTINE W REFLEX MICROSCOPIC (NOT AT [facility])  TROPONIN I  TROPONIN I  TROPONIN I  ANTITHROMBIN III  PROTEIN C ACTIVITY  PROTEIN C, TOTAL  PROTEIN S ACTIVITY  PROTEIN S, TOTAL  LUPUS ANTICOAGULANT PANEL  BETA-2-GLYCOPROTEIN I ABS, IGG/M/A  HOMOCYSTEINE  FACTOR 5 LEIDEN  PROTHROMBIN GENE MUTATION  CARDIOLIPIN ANTIBODIES, IGG, IGM, IGA    Imaging Review Dg Chest 1 View  12/17/2015  CLINICAL DATA:  Acute left humerus fracture. The patient fell inside her house today. EXAM: CHEST 1 VIEW COMPARISON:  11/21/2015 FINDINGS: The heart size and pulmonary vascularity are normal and the lungs are clear. Acute slightly displaced fracture of the proximal  left humeral shaft is noted. Severe arthritic changes of both shoulders are stable. IMPRESSION: No acute disease in the chest. Acute fracture of the proximal left humerus. Electronically Signed   By: Francene Boyers M.D.   On: 12/17/2015 15:25   Ct Head Wo Contrast  12/17/2015  CLINICAL DATA:  82 year old female with recent fall lab presenting with shortness of breath and bilateral lower extremity swelling. Clinical concern for acute encephalopathy. EXAM: CT HEAD WITHOUT CONTRAST TECHNIQUE: Contiguous axial images were obtained from the base of the skull through the vertex without intravenous contrast. COMPARISON:  CT dated 11/21/2015 FINDINGS: There is mild prominence of the ventricles and sulci compatible with age-related atrophy. Moderate periventricular and deep white matter chronic microvascular ischemic changes noted. There is no acute intracranial hemorrhage. No mass effect or midline shift. The visualized paranasal sinuses and mastoid air cells are clear. The calvarium is intact. IMPRESSION: No acute intracranial hemorrhage. Age-related atrophy and chronic microvascular ischemic disease. If symptoms persist and there are no  contraindications, MRI may provide better evaluation if clinically indicated. Electronically Signed   By: Elgie Collard M.D.   On: 12/17/2015 21:13   Ct Angio Chest Pe W/cm &/or Wo Cm  12/17/2015  ADDENDUM REPORT: 12/17/2015 23:55 ADDENDUM: Acute findings discussed with and reconfirmed by Dr.Tuere Nwosu on 12/17/2015 at 11:55 pm. Electronically Signed   By: Awilda Metro M.D.   On: 12/17/2015 23:55  12/17/2015  CLINICAL DATA:  Recent fall, now with shortness of breath and bilateral leg swelling. Acute LEFT humerus fracture. History of hypertension, hyperlipidemia. EXAM: CT ANGIOGRAPHY CHEST WITH CONTRAST TECHNIQUE: Multidetector CT imaging of the chest was performed using the standard protocol during bolus administration of intravenous contrast. Multiplanar CT image  reconstructions and MIPs were obtained to evaluate the vascular anatomy. CONTRAST:  OMNIPAQUE IOHEXOL 350 MG/ML SOLN COMPARISON:  Chest radiograph December 17, 2015 at 1511 hours FINDINGS: PULMONARY ARTERY: Adequate contrast opacification of the pulmonary artery's. Main pulmonary artery is not enlarged. Mild expansile filling defect within the lingula lobar pulmonary artery casting to the segmental and subsegmental branches with occlusion. Acute occlusive LEFT lower lobe segmental to subsegmental pulmonary embolus. Strandy filling defects RIGHT upper lobe segmental, RIGHT middle lobe segmental arteries. RIGHT lower lobe segmental to subsegmental acute pulmonary embolus. MEDIASTINUM: Heart size is mildly enlarged. Suspected RIGHT heart strain objectively though, limited assessment due to cardiac motion. Moderate coronary artery calcifications. No pericardial effusions. Thoracic aorta is normal course and caliber, mild calcific atherosclerosis. No lymphadenopathy by CT size criteria. LUNGS: Tracheobronchial tree is patent, no pneumothorax. No pleural effusions, focal consolidations, pulmonary nodules or masses. LEFT lower lobe small calcified granuloma. SOFT TISSUES AND OSSEOUS STRUCTURES: Included view of the abdomen is nonacute; small hiatal hernia. Acute proximal LEFT humerus fracture. Severe degenerative changes of bilateral shoulders, with screw backout of RIGHT glenoid fossa. Old bilateral anterior with subacute to chronic rib fractures. Osteopenia. Review of the MIP images confirms the above findings. IMPRESSION: Multiple acute bilateral occlusive pulmonary emboli, predominately segmental the subsegmental. Suspected RIGHT heart strain though limited by cardiac motion. Acute proximal LEFT humerus fracture. Electronically Signed: By: Awilda Metro M.D. On: 12/17/2015 23:51   Dg Shoulder Left  12/17/2015  CLINICAL DATA:  Fall today inside her house getting in a chair in her living room. Left arm pain,  no chest complaints, EXAM: LEFT SHOULDER - 2+ VIEW COMPARISON:  Shoulder radiograph 11/21/2015 FINDINGS: Oblique fracture through the proximal diaphysis LEFT humerus. Impaction type fracture of the surgical neck of the humerus. Severe arthropathy of the joint with erosion of the humeral head. IMPRESSION: 1. Acute Oblique fracture through the proximal humeral diaphysis. 2. Acute Impaction fracture of the surgical neck of the humerus. 3. Chronic arthropathy of the humeral head with severe joint space loss and remodeling. Electronically Signed   By: Genevive Bi M.D.   On: 12/17/2015 15:23   I have personally reviewed and evaluated these images and lab results as part of my medical decision-making.   EKG Interpretation   Date/Time:  Monday December 17 2015 15:39:05 EST Ventricular Rate:  82 PR Interval:  172 QRS Duration: 84 QT Interval:  391 QTC Calculation: 457 R Axis:   29 Text Interpretation:  Sinus rhythm Abnormal R-wave progression, early  transition Consider left ventricular hypertrophy agree. no sig change from  old Confirmed by Donnald Garre, MD, Lebron Conners [phone]) on 12/17/2015 4:10:47 PM     Consultation: Dr. Felipa Furnace for admission. MDM   Final diagnoses:  Fracture of left humerus, closed, initial encounter  Dyspnea  Dysphagia  Other acute pulmonary embolism without acute cor pulmonale (HCC)   Patient presents with dyspnea. CT PE study identifies bilateral pulmonary embolus. She sustain an iatrogenic humerus fracture during transport. Patient is alert and interactive. She does not have respiratory distress at rest. She has been treated symptomatically for pain. Dr. Felipa Furnace is discussing with consultants initiation of heparin given history of recent intracranial bleed. This has not been started empirically in the emergency department.     Arby Barrette, MD 12/18/15 [phone]

## 2015-12-17 NOTE — H&P (Signed)
PCP:  Allean Found, MD  Cardiology Ernestine Mcmurray  Referring provider Pfeiffer   Chief Complaint:  Shortness of breath  HPI: [name] is a 82 y.o. female   has a past medical history of Aortic valve disorders; Allergic rhinitis; Arthritis; Dyslipidemia; Obesity; Glaucoma; Hypercholesteremia; Vertigo; and Hypertension.   Presented with shortness of breath and then developed a arm pain after EMS seen her. Patient sustained a fall in and of January and presented on February 1 to March department where she was diagnosed with subarachnoid hemorrhage as well as Colles' fracture of right wrist she was discharged to nursing home [name] from ER. Patient was discharged back at home 3 days ago. She has been complaining of shortness of breath or leg swelling in mainly trouble swallowing. Family thinks she may have had a panic atacck. Usually patietn has good memory and recall but she is unsure about any significant shortness of breath today.  Patient required opted to assist to use the restroom. While EMS arrived to evaluate her for shortness of breath and felt a pop in the left shoulder as patient was standing and she started to complain about left shoulder pain. Patient was taken to emerge department. At baseline patient at times a pleasantly confused. Per family she was eating breakfast well and had no choking episodes. Patient denies any cough fevers or chest pain but history is unreliable. Family stated the lower extremity has been swollen ever since rehabilitation stay  IN ER: Chest x-ray show no acute disease that was noted to have acute fracture proximal left humerus Which was confirmed by left shoulder film. Calcium 8.8 troponin within normal limits white blood cell count 9.6 and she afebrile heart rate 77 satting 99% RA   Regarding pertinent past history: Last echogram in January 2017 showing EF of 50-55 percent and see aortic stenosis Aortic valve: Probably  trileaflet but cannot rule out bicuspid with fusion of the right and left coronary cusps; severely thickened, severely calcified leaflets. Valve mobility was restricted. There was severe stenosis. There was moderate regurgitation. Peak velocity (S): 460 cm/s. Mean gradient (S): 50 mm Hg. Valve area (VTI): 0.84 cm^2. Valve area (Vmax): 0.74 cm^2. Valve area (Vmean): 0.79 cm^2.  Cardiology has seen patient during prior admission and felt that she was a good candidate for TAVR decision was made to refer to her primary cardiologist for the timing of this procedure  Hospitalist was called for admission for complaints of shortness of breath now is found to have new left humerus fracture requiring pain management  Review of Systems:    Pertinent positives include: shortness of breath at rest. Dysphasia,  left arm pain  Constitutional:  No weight loss, night sweats, Fevers, chills, fatigue, weight loss  HEENT:  No headaches, Difficulty swallowing,Tooth/dental problems,Sore throat,  No sneezing, itching, ear ache, nasal congestion, post nasal drip,  Cardio-vascular:  No chest pain, Orthopnea, PND, anasarca, dizziness, palpitations.no Bilateral lower extremity swelling  GI:  No heartburn, indigestion, abdominal pain, nausea, vomiting, diarrhea, change in bowel habits, loss of appetite, melena, blood in stool, hematemesis Resp:  no No dyspnea on exertion, No excess mucus, no productive cough, No non-productive cough, No coughing up of blood.No change in color of mucus.No wheezing. Skin:  no rash or lesions. No jaundice GU:  no dysuria, change in color of urine, no urgency or frequency. No straining to urinate.  No flank pain.  Musculoskeletal:  No joint pain or no joint swelling. No decreased range of  motion. No back pain.  Psych:  No change in mood or affect. No depression or anxiety. No memory loss.  Neuro: no localizing neurological complaints, no tingling, no weakness, no  double vision, no gait abnormality, no slurred speech, no confusion  Otherwise ROS are negative except for above, 10 systems were reviewed  Past Medical History: Past Medical History  Diagnosis Date  . Aortic valve disorders   . Allergic rhinitis   . Arthritis   . Dyslipidemia   . Obesity   . Glaucoma   . Hypercholesteremia   . Vertigo   . Hypertension    Past Surgical History  Procedure Laterality Date  . Appendectomy    . Shoulder surgery    . Bilataral cataracts removal with lens replacement dr. Darel Hong    . Cardiac catheterization      normal coronary arteries     Medications: Prior to Admission medications   Medication Sig Start Date End Date Taking? Authorizing Provider  acetaminophen (TYLENOL) 500 MG tablet Take 1 tablet (500 mg total) by mouth every 6 (six) hours as needed for moderate pain or headache. 11/16/15  Yes Albertine Grates, MD  aspirin EC 81 MG tablet Take 81 mg by mouth daily.   Yes Historical Provider, MD  ezetimibe-simvastatin (VYTORIN) 10-20 MG per tablet Take 1 tablet by mouth daily.   Yes Historical Provider, MD  metoprolol succinate (TOPROL-XL) 25 MG 24 hr tablet Take 1 tablet (25 mg total) by mouth daily. 07/17/15  Yes Brittainy Sherlynn Carbon, PA-C  Multiple Vitamin (MULTIVITAMIN WITH MINERALS) TABS tablet Take 1 tablet by mouth daily.   Yes Historical Provider, MD  oxyCODONE-acetaminophen (PERCOCET/ROXICET) 5-325 MG tablet Take 1 tablet by mouth every 3 (three) hours as needed for moderate pain or severe pain. 11/16/15  Yes Albertine Grates, MD  polyethylene glycol (MIRALAX / GLYCOLAX) packet Take 17 g by mouth daily.   Yes Historical Provider, MD  senna-docusate (SENOKOT-S) 8.6-50 MG tablet Take 1 tablet by mouth 2 (two) times daily. 11/16/15  Yes Albertine Grates, MD  TRAVATAN Z 0.004 % SOLN ophthalmic solution Place 1 drop into both eyes at bedtime.  08/08/13  Yes Historical Provider, MD    Allergies:  No Known Allergies  Social History:  Ambulatory  Cane  Lives at home  alone,   But family is close by     reports that she has never smoked. She has never used smokeless tobacco. She reports that she does not drink alcohol or use illicit drugs.     Family History: family history includes Heart disease in her sister. There is no history of Stroke, CAD, or Cancer.    Physical Exam: Patient Vitals for the past 24 hrs:  BP Temp Temp src Pulse Resp SpO2  12/17/15 1406 174/70 mmHg 97.9 F (36.6 C) Oral 77 22 99 %    1. General:  in No Acute distress 2. Psychological: Alert and  Oriented 3. Head/ENT:   Moist   Mucous Membranes                          Head Non traumatic, neck supple                          Poor  Dentition 4. SKIN: normal   Skin turgor,  Skin clean Dry and intact no rash 5. Heart: Regular rate and rhythm systolic Murmur, Rub or gallop 6. Lungs: Clear to auscultation bilaterally,  no wheezes or crackles   7. Abdomen: Soft, non-tender, Non distended 8. Lower extremities: no clubbing, cyanosis, or edema 9. Neurologically Grossly intact, moving all 4 extremities equally 10. MSK: Normal range of motion  body mass index is unknown because there is no weight on file.   Labs on Admission:   Results for orders placed or performed during the hospital encounter of 12/17/15 (from the past 24 hour(s))  Basic metabolic panel     Status: Abnormal   Collection Time: 12/17/15  4:09 PM  Result Value Ref Range   Sodium 139 135 - 145 mmol/L   Potassium 3.8 3.5 - 5.1 mmol/L   Chloride 106 101 - 111 mmol/L   CO2 24 22 - 32 mmol/L   Glucose, Bld 91 65 - 99 mg/dL   BUN 16 6 - 20 mg/dL   Creatinine, Ser 1.61 0.44 - 1.00 mg/dL   Calcium 8.8 (L) 8.9 - 10.3 mg/dL   GFR calc non Af Amer >60 >60 mL/min   GFR calc Af Amer >60 >60 mL/min   Anion gap 9 5 - 15  Troponin I     Status: None   Collection Time: 12/17/15  4:09 PM  Result Value Ref Range   Troponin I <0.03 <0.031 ng/mL  Brain natriuretic peptide     Status: None   Collection Time: 12/17/15   4:09 PM  Result Value Ref Range   B Natriuretic Peptide 86.1 0.0 - 100.0 pg/mL  CBC with Differential     Status: None   Collection Time: 12/17/15  4:09 PM  Result Value Ref Range   WBC 9.6 4.0 - 10.5 K/uL   RBC 4.28 3.87 - 5.11 MIL/uL   Hemoglobin 12.3 12.0 - 15.0 g/dL   HCT 09.6 04.5 - 40.9 %   MCV 90.7 78.0 - 100.0 fL   MCH 28.7 26.0 - 34.0 pg   MCHC 31.7 30.0 - 36.0 g/dL   RDW 81.1 91.4 - 78.2 %   Platelets 223 150 - 400 K/uL   Neutrophils Relative % 61 %   Neutro Abs 5.9 1.7 - 7.7 K/uL   Lymphocytes Relative 30 %   Lymphs Abs 2.8 0.7 - 4.0 K/uL   Monocytes Relative 7 %   Monocytes Absolute 0.7 0.1 - 1.0 K/uL   Eosinophils Relative 2 %   Eosinophils Absolute 0.2 0.0 - 0.7 K/uL   Basophils Relative 0 %   Basophils Absolute 0.0 0.0 - 0.1 K/uL    UA not  Obtained   No results found for: HGBA1C  CrCl cannot be calculated (Unknown ideal weight.).  BNP (last 3 results) No results for input(s): PROBNP in the last 8760 hours.  Other results:  I have pearsonaly reviewed this: ECG REPORT  Rate82  Rhythm: Sinus rhythm  ST&T Change:  abnormal R wave progression  QTC 472 There were no vitals filed for this visit.   Cultures:    Component Value Date/Time   SDES URINE, CLEAN CATCH 11/21/2015 0950   SPECREQUEST NONE 11/21/2015 0950   CULT  11/21/2015 0950    MULTIPLE SPECIES PRESENT, SUGGEST RECOLLECTION Performed at [facility]    REPTSTATUS 11/22/2015 FINAL 11/21/2015 0950     Radiological Exams on Admission: Dg Chest 1 View  12/17/2015  CLINICAL DATA:  Acute left humerus fracture. The patient fell inside her house today. EXAM: CHEST 1 VIEW COMPARISON:  11/21/2015 FINDINGS: The heart size and pulmonary vascularity are normal and the lungs are clear. Acute slightly displaced fracture  of the proximal left humeral shaft is noted. Severe arthritic changes of both shoulders are stable. IMPRESSION: No acute disease in the chest. Acute fracture of the proximal left  humerus. Electronically Signed   By: Francene Boyers M.D.   On: 12/17/2015 15:25   Dg Shoulder Left  12/17/2015  CLINICAL DATA:  Fall today inside her house getting in a chair in her living room. Left arm pain, no chest complaints, EXAM: LEFT SHOULDER - 2+ VIEW COMPARISON:  Shoulder radiograph 11/21/2015 FINDINGS: Oblique fracture through the proximal diaphysis LEFT humerus. Impaction type fracture of the surgical neck of the humerus. Severe arthropathy of the joint with erosion of the humeral head. IMPRESSION: 1. Acute Oblique fracture through the proximal humeral diaphysis. 2. Acute Impaction fracture of the surgical neck of the humerus. 3. Chronic arthropathy of the humeral head with severe joint space loss and remodeling. Electronically Signed   By: Genevive Bi M.D.   On: 12/17/2015 15:23    Chart has been reviewed  Family   at  Bedside  plan of care was discussed with   Daughter Kodi Guerrera [phone] home [phone] cell  Assessment/Plan   22 of old female history of hypertension , diastolic heart failure and severe aortic stenosis with recent fall resulting in subarachnoid hemorrhage and right wrist fracture presented with shortness of breath and acute left humerus fracture was found to have multiple subsegmental and segmental pulmonary emboli on heparin drip after discussion with neurosurgery given recent subarachnoid bleed Present on Admission:  Pulmonary embolism  - CT scan of the chest showing bilateral subsegmental small segmental pulmonary emboli.  We'll obtain echogram to evaluate for right-sided strain which is usually over read on CT scan. We will obtain cardiac markers. Patient has history of small traumatic subarachnoid bleed in on January 25 which has completely resolved since we have repeat CT scan done today show no evidence of bleeding. Case has been discussed with neurosurgery who feels that it's safe to anticoagulate At this point. Start heparin drip  Obtain  hypercoagulable panel although suspect most likely cause being poor mobility secondary to recent fracture Patient's is far sustained two falls. Discussed this with family who states that usually she is not at risk of fall and distended and the risk of anticoagulation  Will obtain Dopplers to evaluate for source  . Essential hypertension - I'll hold metoprolol given acute pulmonary embolism  . Diastolic dysfunction-grade 2 - evaluate fluid status currently appears to be euvolemic given pulmonary embolism given gentle IV fluids watch for fluid overload  . Fracture of humerus, left, closed - will need orthopedics follow-up apply splint   severe aortic stenosis may be contributing to her shortness of breath or monitor on telemetry cycle cardiac enzymes   patient was supposed to have follow-up with cardiology to determine if she was a good candidate for TAVR, we'll need to make sure patient has follow-up History of subarachnoid hemorrhage given some degree of confusion   obtained CT of the head which showed resolution discussed with neurosurgery prior to initiation of anticoagulation  Prophylaxis: SCD    CODE STATUS:    DNR/DNI as per patient  And family  Disposition:    likely will need placement for rehabilitation                          Other plan as per orders.  I have spent a total of 75 min on this admission extra  time was spent to discuss case with pulmonology and Dr Dutch Quint with Neurosurgery  [facility] 12/18/2015, 1:22 AM     [facility]  Pager [phone]   after 2 AM please page floor coverage PA If 7AM-7PM, please contact the day team taking care of the patient  [email or web address]  Password [email or web address]

## 2015-12-17 NOTE — ED Notes (Signed)
Per EMS: pt was here for a recent fall, broken right arm, and lac to right thigh. Pt was at [facility] for rehab, now back at home. Pt now has c/o SOB and swelling to bilateral legs, swelling was not present before rehab. Pt also was attempting to stand up with 2 assists to use restroom at home, EMS personnel felt pop to left shoulder as pt was standing. Pt now c/o left shoulder/arm pain.

## 2015-12-18 ENCOUNTER — Encounter (HOSPITAL_COMMUNITY): Payer: Self-pay

## 2015-12-18 ENCOUNTER — Inpatient Hospital Stay (HOSPITAL_COMMUNITY): Payer: Medicare Other

## 2015-12-18 DIAGNOSIS — I10 Essential (primary) hypertension: Secondary | ICD-10-CM | POA: Diagnosis present

## 2015-12-18 DIAGNOSIS — H409 Unspecified glaucoma: Secondary | ICD-10-CM | POA: Diagnosis present

## 2015-12-18 DIAGNOSIS — I82401 Acute embolism and thrombosis of unspecified deep veins of right lower extremity: Secondary | ICD-10-CM | POA: Diagnosis present

## 2015-12-18 DIAGNOSIS — Z9181 History of falling: Secondary | ICD-10-CM | POA: Diagnosis not present

## 2015-12-18 DIAGNOSIS — S42302A Unspecified fracture of shaft of humerus, left arm, initial encounter for closed fracture: Secondary | ICD-10-CM | POA: Diagnosis present

## 2015-12-18 DIAGNOSIS — I272 Other secondary pulmonary hypertension: Secondary | ICD-10-CM | POA: Diagnosis present

## 2015-12-18 DIAGNOSIS — R0602 Shortness of breath: Secondary | ICD-10-CM | POA: Diagnosis present

## 2015-12-18 DIAGNOSIS — E785 Hyperlipidemia, unspecified: Secondary | ICD-10-CM | POA: Diagnosis present

## 2015-12-18 DIAGNOSIS — Z79899 Other long term (current) drug therapy: Secondary | ICD-10-CM | POA: Diagnosis not present

## 2015-12-18 DIAGNOSIS — Z87820 Personal history of traumatic brain injury: Secondary | ICD-10-CM | POA: Diagnosis not present

## 2015-12-18 DIAGNOSIS — S52531A Colles' fracture of right radius, initial encounter for closed fracture: Secondary | ICD-10-CM | POA: Diagnosis present

## 2015-12-18 DIAGNOSIS — M7989 Other specified soft tissue disorders: Secondary | ICD-10-CM | POA: Diagnosis not present

## 2015-12-18 DIAGNOSIS — Z8249 Family history of ischemic heart disease and other diseases of the circulatory system: Secondary | ICD-10-CM | POA: Diagnosis not present

## 2015-12-18 DIAGNOSIS — I5032 Chronic diastolic (congestive) heart failure: Secondary | ICD-10-CM | POA: Diagnosis present

## 2015-12-18 DIAGNOSIS — Z961 Presence of intraocular lens: Secondary | ICD-10-CM | POA: Diagnosis present

## 2015-12-18 DIAGNOSIS — S42212A Unspecified displaced fracture of surgical neck of left humerus, initial encounter for closed fracture: Secondary | ICD-10-CM | POA: Diagnosis present

## 2015-12-18 DIAGNOSIS — D649 Anemia, unspecified: Secondary | ICD-10-CM | POA: Diagnosis present

## 2015-12-18 DIAGNOSIS — Z9842 Cataract extraction status, left eye: Secondary | ICD-10-CM | POA: Diagnosis not present

## 2015-12-18 DIAGNOSIS — Z7982 Long term (current) use of aspirin: Secondary | ICD-10-CM | POA: Diagnosis not present

## 2015-12-18 DIAGNOSIS — M19012 Primary osteoarthritis, left shoulder: Secondary | ICD-10-CM | POA: Diagnosis present

## 2015-12-18 DIAGNOSIS — Z9841 Cataract extraction status, right eye: Secondary | ICD-10-CM | POA: Diagnosis not present

## 2015-12-18 DIAGNOSIS — S42309A Unspecified fracture of shaft of humerus, unspecified arm, initial encounter for closed fracture: Secondary | ICD-10-CM | POA: Diagnosis present

## 2015-12-18 DIAGNOSIS — W19XXXA Unspecified fall, initial encounter: Secondary | ICD-10-CM | POA: Diagnosis present

## 2015-12-18 DIAGNOSIS — I2699 Other pulmonary embolism without acute cor pulmonale: Secondary | ICD-10-CM | POA: Diagnosis present

## 2015-12-18 DIAGNOSIS — E7800 Pure hypercholesterolemia, unspecified: Secondary | ICD-10-CM | POA: Diagnosis present

## 2015-12-18 DIAGNOSIS — I350 Nonrheumatic aortic (valve) stenosis: Secondary | ICD-10-CM | POA: Diagnosis present

## 2015-12-18 DIAGNOSIS — Z66 Do not resuscitate: Secondary | ICD-10-CM | POA: Diagnosis present

## 2015-12-18 DIAGNOSIS — G9340 Encephalopathy, unspecified: Secondary | ICD-10-CM | POA: Insufficient documentation

## 2015-12-18 DIAGNOSIS — R1310 Dysphagia, unspecified: Secondary | ICD-10-CM | POA: Diagnosis present

## 2015-12-18 DIAGNOSIS — M1990 Unspecified osteoarthritis, unspecified site: Secondary | ICD-10-CM | POA: Diagnosis present

## 2015-12-18 DIAGNOSIS — I519 Heart disease, unspecified: Secondary | ICD-10-CM | POA: Diagnosis not present

## 2015-12-18 LAB — PHOSPHORUS: Phosphorus: 2.8 mg/dL (ref 2.5–4.6)

## 2015-12-18 LAB — COMPREHENSIVE METABOLIC PANEL
ALBUMIN: 3.1 g/dL — AB (ref 3.5–5.0)
ALK PHOS: 102 U/L (ref 38–126)
ALT: 12 U/L — AB (ref 14–54)
ANION GAP: 7 (ref 5–15)
AST: 25 U/L (ref 15–41)
BUN: 10 mg/dL (ref 6–20)
CALCIUM: 8.5 mg/dL — AB (ref 8.9–10.3)
CHLORIDE: 107 mmol/L (ref 101–111)
CO2: 25 mmol/L (ref 22–32)
CREATININE: 0.46 mg/dL (ref 0.44–1.00)
GFR calc Af Amer: >60 mL/min (ref >60)
GFR calc non Af Amer: >60 mL/min (ref >60)
GLUCOSE: 112 mg/dL — AB (ref 65–99)
Potassium: 3.8 mmol/L (ref 3.5–5.1)
SODIUM: 139 mmol/L (ref 135–145)
Total Bilirubin: 0.9 mg/dL (ref 0.3–1.2)
Total Protein: 5.7 g/dL — ABNORMAL LOW (ref 6.5–8.1)

## 2015-12-18 LAB — TSH: TSH: 1.259 u[IU]/mL (ref 0.350–4.500)

## 2015-12-18 LAB — CBC
HCT: 36.7 % (ref 36.0–46.0)
HEMOGLOBIN: 11.7 g/dL — AB (ref 12.0–15.0)
MCH: 29 pg (ref 26.0–34.0)
MCHC: 31.9 g/dL (ref 30.0–36.0)
MCV: 91.1 fL (ref 78.0–100.0)
Platelets: 238 10*3/uL (ref 150–400)
RBC: 4.03 MIL/uL (ref 3.87–5.11)
RDW: 15.2 % (ref 11.5–15.5)
WBC: 9.7 10*3/uL (ref 4.0–10.5)

## 2015-12-18 LAB — ANTITHROMBIN III: AntiThromb III Func: 95 % (ref 75–120)

## 2015-12-18 LAB — PROTIME-INR
INR: 1.13 (ref 0.00–1.49)
PROTHROMBIN TIME: 14.2 s (ref 11.6–15.2)

## 2015-12-18 LAB — TROPONIN I
Troponin I: <0.03 ng/mL (ref <0.031)
Troponin I: <0.03 ng/mL (ref <0.031)
Troponin I: <0.03 ng/mL (ref <0.031)

## 2015-12-18 LAB — HEPARIN LEVEL (UNFRACTIONATED)
Heparin Unfractionated: 0.77 IU/mL — ABNORMAL HIGH (ref 0.30–0.70)
Heparin Unfractionated: 0.55 IU/mL (ref 0.30–0.70)

## 2015-12-18 LAB — MAGNESIUM: Magnesium: 1.6 mg/dL — ABNORMAL LOW (ref 1.7–2.4)

## 2015-12-18 LAB — MRSA PCR SCREENING: MRSA BY PCR: NEGATIVE

## 2015-12-18 LAB — APTT: aPTT: 32 seconds (ref 24–37)

## 2015-12-18 MED ORDER — HEPARIN (PORCINE) IN NACL 100-0.45 UNIT/ML-% IJ SOLN
1100.0000 [IU]/h | INTRAMUSCULAR | Status: AC
  Administered 2015-12-18: 900 [IU]/h via INTRAVENOUS
  Administered 2015-12-18: 1000 [IU]/h via INTRAVENOUS
  Administered 2015-12-20 – 2015-12-21 (×2): 900 [IU]/h via INTRAVENOUS
  Filled 2015-12-18 (×4): qty 250

## 2015-12-18 MED ORDER — HYDROCODONE-ACETAMINOPHEN 5-325 MG PO TABS
1.0000 | ORAL_TABLET | ORAL | Status: DC | PRN
  Administered 2015-12-18 – 2015-12-25 (×12): 1 via ORAL
  Filled 2015-12-18 (×12): qty 1

## 2015-12-18 MED ORDER — GUAIFENESIN ER 600 MG PO TB12
600.0000 mg | ORAL_TABLET | Freq: Two times a day (BID) | ORAL | Status: DC
  Administered 2015-12-18 – 2015-12-25 (×16): 600 mg via ORAL
  Filled 2015-12-18 (×16): qty 1

## 2015-12-18 MED ORDER — FLEET ENEMA 7-19 GM/118ML RE ENEM: 1.0000 | ENEMA | Freq: Once | RECTAL | Status: DC | PRN

## 2015-12-18 MED ORDER — ALBUTEROL SULFATE (2.5 MG/3ML) 0.083% IN NEBU: 2.5000 mg | INHALATION_SOLUTION | RESPIRATORY_TRACT | Status: DC | PRN

## 2015-12-18 MED ORDER — ONDANSETRON HCL 4 MG/2ML IJ SOLN: 4.0000 mg | Freq: Four times a day (QID) | INTRAMUSCULAR | Status: DC | PRN

## 2015-12-18 MED ORDER — LATANOPROST 0.005 % OP SOLN
1.0000 [drp] | Freq: Every day | OPHTHALMIC | Status: DC
  Administered 2015-12-18 – 2015-12-24 (×7): 1 [drp] via OPHTHALMIC
  Filled 2015-12-18: qty 2.5

## 2015-12-18 MED ORDER — MORPHINE SULFATE (PF) 2 MG/ML IV SOLN
2.0000 mg | INTRAVENOUS | Status: DC | PRN
  Administered 2015-12-18 (×2): 2 mg via INTRAVENOUS
  Filled 2015-12-18 (×2): qty 1

## 2015-12-18 MED ORDER — EZETIMIBE-SIMVASTATIN 10-20 MG PO TABS
1.0000 | ORAL_TABLET | Freq: Every day | ORAL | Status: DC
  Filled 2015-12-18: qty 1

## 2015-12-18 MED ORDER — TRAMADOL HCL 50 MG PO TABS
50.0000 mg | ORAL_TABLET | Freq: Three times a day (TID) | ORAL | Status: DC | PRN
  Administered 2015-12-21 – 2015-12-24 (×4): 50 mg via ORAL
  Filled 2015-12-18 (×4): qty 1

## 2015-12-18 MED ORDER — POLYETHYLENE GLYCOL 3350 17 G PO PACK
17.0000 g | PACK | Freq: Every day | ORAL | Status: DC
  Administered 2015-12-18 – 2015-12-25 (×8): 17 g via ORAL
  Filled 2015-12-18 (×8): qty 1

## 2015-12-18 MED ORDER — SODIUM CHLORIDE 0.9 % IV SOLN
INTRAVENOUS | Status: DC
  Administered 2015-12-18 (×2): via INTRAVENOUS

## 2015-12-18 MED ORDER — MORPHINE SULFATE (PF) 2 MG/ML IV SOLN
2.0000 mg | INTRAVENOUS | Status: DC | PRN
  Administered 2015-12-18: 2 mg via INTRAVENOUS
  Filled 2015-12-18: qty 1

## 2015-12-18 MED ORDER — ONDANSETRON HCL 4 MG PO TABS: 4.0000 mg | ORAL_TABLET | Freq: Four times a day (QID) | ORAL | Status: DC | PRN

## 2015-12-18 MED ORDER — BISACODYL 10 MG RE SUPP: 10.0000 mg | Freq: Every day | RECTAL | Status: DC | PRN

## 2015-12-18 MED ORDER — SENNOSIDES-DOCUSATE SODIUM 8.6-50 MG PO TABS
1.0000 | ORAL_TABLET | Freq: Two times a day (BID) | ORAL | Status: DC
  Administered 2015-12-18 – 2015-12-25 (×15): 1 via ORAL
  Filled 2015-12-18 (×15): qty 1

## 2015-12-18 MED ORDER — DIPHENHYDRAMINE HCL 12.5 MG/5ML PO ELIX
12.5000 mg | ORAL_SOLUTION | Freq: Every evening | ORAL | Status: DC | PRN
  Administered 2015-12-18 – 2015-12-24 (×3): 12.5 mg via ORAL
  Filled 2015-12-18 (×3): qty 5

## 2015-12-18 MED ORDER — EZETIMIBE-SIMVASTATIN 10-20 MG PO TABS
1.0000 | ORAL_TABLET | Freq: Every day | ORAL | Status: DC
  Administered 2015-12-19 – 2015-12-24 (×6): 1 via ORAL
  Filled 2015-12-18 (×8): qty 1

## 2015-12-18 MED ORDER — ACETAMINOPHEN 650 MG RE SUPP: 650.0000 mg | Freq: Four times a day (QID) | RECTAL | Status: DC | PRN

## 2015-12-18 MED ORDER — METOPROLOL SUCCINATE ER 25 MG PO TB24
25.0000 mg | ORAL_TABLET | Freq: Every day | ORAL | Status: DC
  Administered 2015-12-18 – 2015-12-25 (×8): 25 mg via ORAL
  Filled 2015-12-18 (×8): qty 1

## 2015-12-18 MED ORDER — ALBUTEROL SULFATE (2.5 MG/3ML) 0.083% IN NEBU
2.5000 mg | INHALATION_SOLUTION | RESPIRATORY_TRACT | Status: DC | PRN
Start: 2015-12-18 — End: 2015-12-25

## 2015-12-18 MED ORDER — SODIUM CHLORIDE 0.9% FLUSH
3.0000 mL | Freq: Two times a day (BID) | INTRAVENOUS | Status: DC
  Administered 2015-12-18 – 2015-12-24 (×8): 3 mL via INTRAVENOUS

## 2015-12-18 MED ORDER — HEPARIN BOLUS VIA INFUSION
3000.0000 [IU] | Freq: Once | INTRAVENOUS | Status: AC
  Administered 2015-12-18: 3000 [IU] via INTRAVENOUS
  Filled 2015-12-18: qty 3000

## 2015-12-18 MED ORDER — TRAMADOL HCL 50 MG PO TABS
50.0000 mg | ORAL_TABLET | Freq: Two times a day (BID) | ORAL | Status: DC | PRN
  Administered 2015-12-18: 50 mg via ORAL
  Filled 2015-12-18: qty 1

## 2015-12-18 MED ORDER — ACETAMINOPHEN 325 MG PO TABS
650.0000 mg | ORAL_TABLET | Freq: Four times a day (QID) | ORAL | Status: DC | PRN
  Administered 2015-12-18 – 2015-12-21 (×5): 650 mg via ORAL
  Filled 2015-12-18 (×5): qty 2

## 2015-12-18 NOTE — Consult Note (Signed)
Reason for Consult: Left arm pain Referring Physician: [name] is an 82 y.o. female.  HPI: Transported by EMS yesterday for SOB. Lifted patient felt a pop. Pain         Has DVT on heparin.  Past Medical History  Diagnosis Date  . Aortic valve disorders   . Allergic rhinitis   . Arthritis   . Dyslipidemia   . Obesity   . Glaucoma   . Hypercholesteremia   . Vertigo   . Hypertension     Past Surgical History  Procedure Laterality Date  . Appendectomy    . Shoulder surgery    . Bilataral cataracts removal with lens replacement dr. Tommy Rainwater    . Cardiac catheterization      normal coronary arteries    Family History  Problem Relation Age of Onset  . Heart disease Sister   . Stroke Neg Hx   . CAD Neg Hx   . Cancer Neg Hx     Social History:  reports that she has never smoked. She has never used smokeless tobacco. She reports that she does not drink alcohol or use illicit drugs.  Allergies: No Known Allergies  Medications: I have reviewed the patient's current medications.  Results for orders placed or performed during the hospital encounter of 12/17/15 (from the past 48 hour(s))  Basic metabolic panel     Status: Abnormal   Collection Time: 12/17/15  4:09 PM  Result Value Ref Range   Sodium 139 135 - 145 mmol/L   Potassium 3.8 3.5 - 5.1 mmol/L   Chloride 106 101 - 111 mmol/L   CO2 24 22 - 32 mmol/L   Glucose, Bld 91 65 - 99 mg/dL   BUN 16 6 - 20 mg/dL   Creatinine, Ser 0.49 0.44 - 1.00 mg/dL   Calcium 8.8 (L) 8.9 - 10.3 mg/dL   GFR calc non Af Amer >60 >60 mL/min   GFR calc Af Amer >60 >60 mL/min    Comment: (NOTE) The eGFR has been calculated using the CKD EPI equation. This calculation has not been validated in all clinical situations. eGFR's persistently <60 mL/min signify possible Chronic Kidney Disease.    Anion gap 9 5 - 15  Troponin I     Status: None   Collection Time: 12/17/15  4:09 PM  Result Value Ref Range   Troponin I <0.03  <0.031 ng/mL    Comment:        NO INDICATION OF MYOCARDIAL INJURY.   Brain natriuretic peptide     Status: None   Collection Time: 12/17/15  4:09 PM  Result Value Ref Range   B Natriuretic Peptide 86.1 0.0 - 100.0 pg/mL  CBC with Differential     Status: None   Collection Time: 12/17/15  4:09 PM  Result Value Ref Range   WBC 9.6 4.0 - 10.5 K/uL   RBC 4.28 3.87 - 5.11 MIL/uL   Hemoglobin 12.3 12.0 - 15.0 g/dL   HCT 38.8 36.0 - 46.0 %   MCV 90.7 78.0 - 100.0 fL   MCH 28.7 26.0 - 34.0 pg   MCHC 31.7 30.0 - 36.0 g/dL   RDW 15.0 11.5 - 15.5 %   Platelets 223 150 - 400 K/uL   Neutrophils Relative % 61 %   Neutro Abs 5.9 1.7 - 7.7 K/uL   Lymphocytes Relative 30 %   Lymphs Abs 2.8 0.7 - 4.0 K/uL   Monocytes Relative 7 %   Monocytes Absolute  0.7 0.1 - 1.0 K/uL   Eosinophils Relative 2 %   Eosinophils Absolute 0.2 0.0 - 0.7 K/uL   Basophils Relative 0 %   Basophils Absolute 0.0 0.0 - 0.1 K/uL  Troponin I (q 6hr x 3)     Status: None   Collection Time: 12/18/15 12:37 AM  Result Value Ref Range   Troponin I <0.03 <0.031 ng/mL    Comment:        NO INDICATION OF MYOCARDIAL INJURY.   Antithrombin III     Status: None   Collection Time: 12/18/15 12:37 AM  Result Value Ref Range   AntiThromb III Func 95 75 - 120 %    Comment: Performed at [facility]  APTT     Status: None   Collection Time: 12/18/15 12:37 AM  Result Value Ref Range   aPTT 32 24 - 37 seconds  Protime-INR     Status: None   Collection Time: 12/18/15 12:37 AM  Result Value Ref Range   Prothrombin Time 14.2 11.6 - 15.2 seconds   INR 1.13 0.00 - 1.49  Troponin I (q 6hr x 3)     Status: None   Collection Time: 12/18/15  6:02 AM  Result Value Ref Range   Troponin I <0.03 <0.031 ng/mL    Comment:        NO INDICATION OF MYOCARDIAL INJURY.   Magnesium     Status: Abnormal   Collection Time: 12/18/15  6:02 AM  Result Value Ref Range   Magnesium 1.6 (L) 1.7 - 2.4 mg/dL  Phosphorus     Status: None    Collection Time: 12/18/15  6:02 AM  Result Value Ref Range   Phosphorus 2.8 2.5 - 4.6 mg/dL  TSH     Status: None   Collection Time: 12/18/15  6:02 AM  Result Value Ref Range   TSH 1.259 0.350 - 4.500 uIU/mL  Comprehensive metabolic panel     Status: Abnormal   Collection Time: 12/18/15  6:02 AM  Result Value Ref Range   Sodium 139 135 - 145 mmol/L   Potassium 3.8 3.5 - 5.1 mmol/L   Chloride 107 101 - 111 mmol/L   CO2 25 22 - 32 mmol/L   Glucose, Bld 112 (H) 65 - 99 mg/dL   BUN 10 6 - 20 mg/dL   Creatinine, Ser 0.46 0.44 - 1.00 mg/dL   Calcium 8.5 (L) 8.9 - 10.3 mg/dL   Total Protein 5.7 (L) 6.5 - 8.1 g/dL   Albumin 3.1 (L) 3.5 - 5.0 g/dL   AST 25 15 - 41 U/L   ALT 12 (L) 14 - 54 U/L   Alkaline Phosphatase 102 38 - 126 U/L   Total Bilirubin 0.9 0.3 - 1.2 mg/dL   GFR calc non Af Amer >60 >60 mL/min   GFR calc Af Amer >60 >60 mL/min    Comment: (NOTE) The eGFR has been calculated using the CKD EPI equation. This calculation has not been validated in all clinical situations. eGFR's persistently <60 mL/min signify possible Chronic Kidney Disease.    Anion gap 7 5 - 15  CBC     Status: Abnormal   Collection Time: 12/18/15  6:02 AM  Result Value Ref Range   WBC 9.7 4.0 - 10.5 K/uL   RBC 4.03 3.87 - 5.11 MIL/uL   Hemoglobin 11.7 (L) 12.0 - 15.0 g/dL   HCT 36.7 36.0 - 46.0 %   MCV 91.1 78.0 - 100.0 fL   MCH 29.0  26.0 - 34.0 pg   MCHC 31.9 30.0 - 36.0 g/dL   RDW 15.2 11.5 - 15.5 %   Platelets 238 150 - 400 K/uL  MRSA PCR Screening     Status: None   Collection Time: 12/18/15  6:21 AM  Result Value Ref Range   MRSA by PCR NEGATIVE NEGATIVE    Comment:        The GeneXpert MRSA Assay (FDA approved for NASAL specimens only), is one component of a comprehensive MRSA colonization surveillance program. It is not intended to diagnose MRSA infection nor to guide or monitor treatment for MRSA infections.   Heparin level (unfractionated)     Status: Abnormal   Collection  Time: 12/18/15 10:00 AM  Result Value Ref Range   Heparin Unfractionated 0.77 (H) 0.30 - 0.70 IU/mL    Comment:        IF HEPARIN RESULTS ARE BELOW EXPECTED VALUES, AND PATIENT DOSAGE HAS BEEN CONFIRMED, SUGGEST FOLLOW UP TESTING OF ANTITHROMBIN III LEVELS.   Troponin I (q 6hr x 3)     Status: None   Collection Time: 12/18/15 11:44 AM  Result Value Ref Range   Troponin I <0.03 <0.031 ng/mL    Comment:        NO INDICATION OF MYOCARDIAL INJURY.     Dg Chest 1 View  12/17/2015  CLINICAL DATA:  Acute left humerus fracture. The patient fell inside her house today. EXAM: CHEST 1 VIEW COMPARISON:  11/21/2015 FINDINGS: The heart size and pulmonary vascularity are normal and the lungs are clear. Acute slightly displaced fracture of the proximal left humeral shaft is noted. Severe arthritic changes of both shoulders are stable. IMPRESSION: No acute disease in the chest. Acute fracture of the proximal left humerus. Electronically Signed   By: Lorriane Shire M.D.   On: 12/17/2015 15:25   Ct Head Wo Contrast  12/17/2015  CLINICAL DATA:  82 year old female with recent fall lab presenting with shortness of breath and bilateral lower extremity swelling. Clinical concern for acute encephalopathy. EXAM: CT HEAD WITHOUT CONTRAST TECHNIQUE: Contiguous axial images were obtained from the base of the skull through the vertex without intravenous contrast. COMPARISON:  CT dated 11/21/2015 FINDINGS: There is mild prominence of the ventricles and sulci compatible with age-related atrophy. Moderate periventricular and deep white matter chronic microvascular ischemic changes noted. There is no acute intracranial hemorrhage. No mass effect or midline shift. The visualized paranasal sinuses and mastoid air cells are clear. The calvarium is intact. IMPRESSION: No acute intracranial hemorrhage. Age-related atrophy and chronic microvascular ischemic disease. If symptoms persist and there are no contraindications, MRI may  provide better evaluation if clinically indicated. Electronically Signed   By: Anner Crete M.D.   On: 12/17/2015 21:13   Ct Angio Chest Pe W/cm &/or Wo Cm  12/17/2015  ADDENDUM REPORT: 12/17/2015 23:55 ADDENDUM: Acute findings discussed with and reconfirmed by Dr.MARCY PFEIFFER on 12/17/2015 at 11:55 pm. Electronically Signed   By: Elon Alas M.D.   On: 12/17/2015 23:55  12/17/2015  CLINICAL DATA:  Recent fall, now with shortness of breath and bilateral leg swelling. Acute LEFT humerus fracture. History of hypertension, hyperlipidemia. EXAM: CT ANGIOGRAPHY CHEST WITH CONTRAST TECHNIQUE: Multidetector CT imaging of the chest was performed using the standard protocol during bolus administration of intravenous contrast. Multiplanar CT image reconstructions and MIPs were obtained to evaluate the vascular anatomy. CONTRAST:  139m OMNIPAQUE IOHEXOL 350 MG/ML SOLN COMPARISON:  Chest radiograph December 17, 2015 at 1511 hours FINDINGS: PULMONARY ARTERY:  Adequate contrast opacification of the pulmonary artery's. Main pulmonary artery is not enlarged. Mild expansile filling defect within the lingula lobar pulmonary artery casting to the segmental and subsegmental branches with occlusion. Acute occlusive LEFT lower lobe segmental to subsegmental pulmonary embolus. Strandy filling defects RIGHT upper lobe segmental, RIGHT middle lobe segmental arteries. RIGHT lower lobe segmental to subsegmental acute pulmonary embolus. MEDIASTINUM: Heart size is mildly enlarged. Suspected RIGHT heart strain objectively though, limited assessment due to cardiac motion. Moderate coronary artery calcifications. No pericardial effusions. Thoracic aorta is normal course and caliber, mild calcific atherosclerosis. No lymphadenopathy by CT size criteria. LUNGS: Tracheobronchial tree is patent, no pneumothorax. No pleural effusions, focal consolidations, pulmonary nodules or masses. LEFT lower lobe small calcified granuloma. SOFT  TISSUES AND OSSEOUS STRUCTURES: Included view of the abdomen is nonacute; small hiatal hernia. Acute proximal LEFT humerus fracture. Severe degenerative changes of bilateral shoulders, with screw backout of RIGHT glenoid fossa. Old bilateral anterior with subacute to chronic rib fractures. Osteopenia. Review of the MIP images confirms the above findings. IMPRESSION: Multiple acute bilateral occlusive pulmonary emboli, predominately segmental the subsegmental. Suspected RIGHT heart strain though limited by cardiac motion. Acute proximal LEFT humerus fracture. Electronically Signed: By: Elon Alas M.D. On: 12/17/2015 23:51   Dg Shoulder Left  12/17/2015  CLINICAL DATA:  Fall today inside her house getting in a chair in her living room. Left arm pain, no chest complaints, EXAM: LEFT SHOULDER - 2+ VIEW COMPARISON:  Shoulder radiograph 11/21/2015 FINDINGS: Oblique fracture through the proximal diaphysis LEFT humerus. Impaction type fracture of the surgical neck of the humerus. Severe arthropathy of the joint with erosion of the humeral head. IMPRESSION: 1. Acute Oblique fracture through the proximal humeral diaphysis. 2. Acute Impaction fracture of the surgical neck of the humerus. 3. Chronic arthropathy of the humeral head with severe joint space loss and remodeling. Electronically Signed   By: Suzy Bouchard M.D.   On: 12/17/2015 15:23    Review of Systems  Musculoskeletal: Positive for joint pain.  All other systems reviewed and are negative.  Blood pressure 165/92, pulse 94, temperature 97.7 F (36.5 C), temperature source Oral, resp. rate 20, height _0  (1.6 m), weight 74.2 kg (163 lb 9.3 oz), SpO2 98 %. Physical Exam  Constitutional: She is oriented to person, place, and time. She appears well-developed.  HENT:  Head: Normocephalic.  Eyes: Pupils are equal, round, and reactive to light.  Neck: Normal range of motion.  Cardiovascular: Normal rate.   Respiratory: Effort normal.  GI:  Soft.  Musculoskeletal:  Left arm swelling, ecchymosis Skin intact. Compartments soft to moderate anterior. 2+ pulses distal. Elbow nontender. Good grip strength. Forearm normal. Hand nontender.  Neurological: She is alert and oriented to person, place, and time.  Skin: Skin is warm and dry.  Psychiatric: She has a normal mood and affect.    Assessment/Plan: Left proximal humerus fracture including surgical neck minimally displaced. Severe DJD left shoulder Anticoagulated on heparin for DVT moderate swelling. Recommend elevation. Splinting of arm. Ice. Monitor NV status. Will follow  Jancie Kercher C 12/18/2015, 5:32 PM

## 2015-12-18 NOTE — Progress Notes (Signed)
*  PRELIMINARY RESULTS* Vascular Ultrasound Lower extremity venous duplex has been completed.  Preliminary findings: DVT noted in the right popliteal vein, right posterior tibial veins, an right peroneal veins. No DVT LLE. Baker's cyst on the right.  Farrel Demark, RDMS, RVT  12/18/2015, 1:55 PM

## 2015-12-18 NOTE — Progress Notes (Addendum)
ANTICOAGULATION CONSULT NOTE - Follow Up Consult  Pharmacy Consult for Heparin Indication: pulmonary embolus/DVT  No Known Allergies  Patient Measurements: Height:  (160 cm) Weight: 163 lb 9.3 oz (74.2 kg) IBW/kg (Calculated) : 52.4 Heparin Dosing Weight: 68 kg  Vital Signs: Temp: 97.7 F (36.5 C) (02/28 1430) Temp Source: Oral (02/28 1430) BP: 165/92 mmHg (02/28 1430) Pulse Rate: 94 (02/28 1430)  Labs:  Recent Labs  12/17/15 1609 12/18/15 0037 12/18/15 0602 12/18/15 1000 12/18/15 1144 12/18/15 2026  HGB 12.3  --  11.7*  --   --   --   HCT 38.8  --  36.7  --   --   --   PLT 223  --  238  --   --   --   APTT  --  32  --   --   --   --   LABPROT  --  14.2  --   --   --   --   INR  --  1.13  --   --   --   --   HEPARINUNFRC  --   --   --  0.77*  --  0.55  CREATININE 0.49  --  0.46  --   --   --   TROPONINI <0.03 <0.03 <0.03  --  <0.03  --     Estimated Creatinine Clearance: 46 mL/min (by C-G formula based on Cr of 0.46).   Medications:  Infusions:  . heparin 900 Units/hr (12/18/15 1112)    Assessment: 58 yoF presented to ED on 2/27 with c/o arm pain, SOB, bilateral leg swelling.  PMH significant for HTN, dCHF, severe aortic stenosis, and recent fall resulting in right wrist fracture, and small subarachnoid hemorrhage discharged to rehab at [name] and had transferred home about 3 days ago.  She sustained a left humerus fracture during transport to ED.  CTa confirms bilateral occlusive PE.  Neurosurgery discussed history of subarachnoid bleed and and agrees with starting anticoagulation since repeat CT head today shows resolution of bleeding.  Pharmacy is consulted to dose Heparin IV.  Today, 12/18/2015:  Dopplers = +DVT RLE (no DVT LLE)  Heparin level = 0.55 with heparin @ 900 units/hr  No complications of therapy noted  Goal of Therapy:  Heparin level 0.3-0.7 units/ml Monitor platelets by anticoagulation protocol: Yes   Plan:   Continue heparin  IV infusion at 900 units/hr (9 ml/hr)  Daily heparin level and CBC  Continue to monitor for s/s bleeding or thrombosis.  Follow up long-term anticoagulation plan.  Terrilee Files, PharmD  12/18/2015 9:08 PM

## 2015-12-18 NOTE — Progress Notes (Signed)
PROGRESS NOTE  [name]:[id number] DOB: March 23, 1924 DOA: 12/17/2015 PCP: Allean Found, MD  Summary: 82 year old woman recently discharged from [name] and I'll see with home health PMH severe aortic stenosis, recent hospitalization for a fall resulting in subarachnoid hemorrhage and right wrist fracture; presented with shortness of breath and acute left humerus fracture great found to have multiple subsegmental and segmental pulmonary emboli. Case was discussed with neurosurgery given recent subarachnoid bleed and patient was cleared to begin heparin infusion.  Assessment/Plan: 1. Bilateral acute pulmonary emboli. Asymptomatic. Heart strain considered based on CT findings. Hemodynamics are stable. No hypoxia. Troponins negative. EKG SR, nonacute. 2. Left humerus fracture. Continue splint. Follow up with orthopedics as an outpatient. 3. PMH small traumatic subarachnoid hemorrhage, Colles' fracture right wrist status post fall 10/2015. Asymptomatic. Repeat CT scan on admission showed no evidence of bleeding. Admitting physician discussed the case with neurosurgery felt it was safe to anticoagulate. 4. Severe aortic stenosis. Seen by cardiology in past, felt to be candidate for TAVR, referred to primary cardiologist for timing of procedure.   Appears to be stable. Continue heparin. Start warfarin 3/1 if stable. F.u echo and BLE venous dopplers.  Consult ST  Consult PT  F/u with ortho as an outpatient.  Code Status: DNR/DNI DVT prophylaxis: heparin Family Communication: daughter [name]  Disposition Plan: home when improved  Brendia Sacks, MD  [facility]  Pager [phone] If 7PM-7AM, please contact night-coverage at [email or web address], password [facility] 12/18/2015, 9:06 AM  LOS: 0 days   Consultants:    Procedures:    Antibiotics:    HPI/Subjective: Feels fine. No chest pain, no shortness of breath. Does have some left arm pain. Does have some right leg  swelling.  Objective: Filed Vitals:   12/18/15 0451 12/18/15 0537 12/18/15 0609 12/18/15 0628  BP: 169/57 123/92  154/75  Pulse: 89 98  82  Temp:  98.3 F (36.8 C)  97.8 F (36.6 C)  TempSrc:  Axillary  Oral  Resp: Height:    (1.6 m)   Weight:  74.2 kg (163 lb 9.3 oz)    SpO2: 99% 98%  96%    Intake/Output Summary (Last 24 hours) at 12/18/15 0906 Last data filed at 12/18/15 0700  Gross per 24 hour  Intake 283.75 ml  Output      0 ml  Net 283.75 ml     Filed Weights   12/18/15 0537  Weight: 74.2 kg (163 lb 9.3 oz)    Exam:    General:  Appears calm and comfortable Cardiovascular: RRR, no m/r/g. No LE edema. Telemetry: SR, no arrhythmias  Respiratory: CTA bilaterally, no w/r/r. Normal respiratory effort. Musculoskeletal: grossly normal tone BUE/BLE. There is a well-healing laceration lateral to the right knee. Psychiatric: grossly normal mood and affect, speech fluent and appropriate Neurologic: grossly non-focal.  New data reviewed:  Complete metabolic panel, troponin, CBC unremarkable.  TSH within normal limits  Scheduled Meds: . ezetimibe-simvastatin  1 tablet Oral q1800  . guaiFENesin  600 mg Oral BID  . latanoprost  1 drop Both Eyes QHS  . polyethylene glycol  17 g Oral Daily  . senna-docusate  1 tablet Oral BID  . sodium chloride flush  3 mL Intravenous Q12H   Continuous Infusions: . sodium chloride 75 mL/hr at 12/18/15 0619  . heparin 1,000 Units/hr (12/18/15 0140)    Principal Problem:   Pulmonary embolism (HCC) Active Problems:   Aortic stenosis, severe   Essential hypertension  SAH (subarachnoid hemorrhage) (HCC)   Diastolic dysfunction-grade 2   Fracture of humerus, left, closed   Humerus fracture

## 2015-12-18 NOTE — Progress Notes (Signed)
Nutrition Brief Note  Patient identified on the low Braden screening report.  Wt Readings from Last 15 Encounters:  12/18/15 163 lb 9.3 oz (74.2 kg)  11/16/15 166 lb 10.7 oz (75.6 kg)  07/17/15 170 lb 6.4 oz (77.293 kg)  09/07/13 170 lb (77.111 kg)    Body mass index is 28.98 kg/(m^2). Patient meets criteria for overweight based on current BMI.   Pt seen for low Braden. She has two broken arms and needs feeding assistance during meal times; pt reports she did receive this assistance this AM while she ate a cinnamon raisin bagel and grapes. Pt reports good appetite now and PTA although appetite has slightly declined over the past 1 year due to age. She also states tastes have changed (slight decrease in taste sensation) which she also attributes to age. Pt's daughter, who is at bedside, confirms good appetite and intakes PTA and states pt has lost ~10 lbs in the past 1 year. Per chart review, pt has lost 7 lbs (4% body weight) in the past 5 months which is not significant for time frame.   Current diet order is Heart Healthy, patient is consuming approximately 100% of meals at this time. Labs and medications reviewed.   No nutrition interventions warranted at this time. If nutrition issues arise, please consult RD.     Trenton Gammon, RD, LDN Inpatient Clinical Dietitian Pager # [phone] After hours/weekend pager # [phone]

## 2015-12-18 NOTE — Progress Notes (Signed)
ANTICOAGULATION CONSULT NOTE - Initial Consult  Pharmacy Consult for IV heparin Indication: pulmonary embolus  No Known Allergies  Patient Measurements:   Heparin Dosing Weight: 59 kg  Vital Signs: Temp: 97.9 F (36.6 C) (02/27 1406) Temp Source: Oral (02/27 1406) BP: 188/50 mmHg (02/28 0020) Pulse Rate: 92 (02/28 0020)  Labs:  Recent Labs  12/17/15 1609  HGB 12.3  HCT 38.8  PLT 223  CREATININE 0.49  TROPONINI <0.03    CrCl cannot be calculated (Unknown ideal weight.).   Medical History: Past Medical History  Diagnosis Date  . Aortic valve disorders   . Allergic rhinitis   . Arthritis   . Dyslipidemia   . Obesity   . Glaucoma   . Hypercholesteremia   . Vertigo   . Hypertension     Assessment: 89 yoF c/o SOB and arm pain.  IV heparin per Rx for bilateral PE.   Goal of Therapy:  Heparin level 0.3-0.7 units/ml Monitor platelets by anticoagulation protocol: Yes   Plan:   Baseline coags and weight stat  Heparin 3000 unit bolus x1  Start heparin drip @ 1000 units/hr  Daily CBC/HL  Check 1st HL 8 hours from start of drip  Susanne Greenhouse R 12/18/2015,12:56 AM

## 2015-12-18 NOTE — Progress Notes (Signed)
ANTICOAGULATION CONSULT NOTE - Follow Up Consult  Pharmacy Consult for Heparin Indication: pulmonary embolus  No Known Allergies  Patient Measurements: Height:  (160 cm) Weight: 163 lb 9.3 oz (74.2 kg) IBW/kg (Calculated) : 52.4 Heparin Dosing Weight: 68 kg  Vital Signs: Temp: 97.8 F (36.6 C) (02/28 0628) Temp Source: Oral (02/28 0628) BP: 154/75 mmHg (02/28 0628) Pulse Rate: 82 (02/28 0628)  Labs:  Recent Labs  12/17/15 1609 12/18/15 0037 12/18/15 0602  HGB 12.3  --  11.7*  HCT 38.8  --  36.7  PLT 223  --  238  APTT  --  32  --   LABPROT  --  14.2  --   INR  --  1.13  --   CREATININE 0.49  --  0.46  TROPONINI <0.03 <0.03 <0.03    Estimated Creatinine Clearance: 46 mL/min (by C-G formula based on Cr of 0.46).   Medications:  Infusions:  . sodium chloride 75 mL/hr at 12/18/15 0619  . heparin 1,000 Units/hr (12/18/15 0140)    Assessment: 62 yoF presented to ED on 2/27 with c/o arm pain, SOB, bilateral leg swelling.  PMH significant for HTN, dCHF, severe aortic stenosis, and recent fall resulting in right wrist fracture, and small subarachnoid hemorrhage discharged to rehab at [name] and had transferred home about 3 days ago.  She sustained a left humerus fracture during transport to ED.  CTa confirms bilateral occlusive PE.  Neurosurgery discussed history of subarachnoid bleed and and agrees with starting anticoagulation since repeat CT head today shows resolution of bleeding.  Pharmacy is consulted to dose Heparin IV.  Today, 12/18/2015:  HL 0.77, above goal on Heparin at 10 ml/hr  CBC:  Hgb decreased (12.3 > 11.7), but Plt remain WNL  No bleeding or complications reported by RN or patient.  Diet: heart healthy   Goal of Therapy:  Heparin level 0.3-0.7 units/ml Monitor platelets by anticoagulation protocol: Yes   Plan:   Decrease to heparin IV infusion at 900 units/hr (9 ml/hr)  Heparin level 8 hours after rate change.  Daily heparin  level and CBC  Continue to monitor for s/s bleeding or thrombosis.  Follow up long-term anticoagulation plan.  Lynann Beaver PharmD, BCPS Pager [phone] 12/18/2015 11:11 AM

## 2015-12-19 ENCOUNTER — Inpatient Hospital Stay (HOSPITAL_COMMUNITY): Payer: Medicare Other

## 2015-12-19 LAB — CBC
HEMATOCRIT: 31.8 % — AB (ref 36.0–46.0)
HEMOGLOBIN: 10 g/dL — AB (ref 12.0–15.0)
MCH: 28.2 pg (ref 26.0–34.0)
MCHC: 31.4 g/dL (ref 30.0–36.0)
MCV: 89.8 fL (ref 78.0–100.0)
PLATELETS: 229 10*3/uL (ref 150–400)
RBC: 3.54 MIL/uL — ABNORMAL LOW (ref 3.87–5.11)
RDW: 15 % (ref 11.5–15.5)
WBC: 11.1 10*3/uL — AB (ref 4.0–10.5)

## 2015-12-19 LAB — LUPUS ANTICOAGULANT PANEL
DRVVT: 42.1 s (ref 0.0–44.0)
PTT LA: 41 s (ref 0.0–43.6)

## 2015-12-19 LAB — HOMOCYSTEINE: Homocysteine: 9.9 umol/L (ref 0.0–15.0)

## 2015-12-19 LAB — PROTEIN C, TOTAL: PROTEIN C, TOTAL: 101 % (ref 60–150)

## 2015-12-19 LAB — HEPARIN LEVEL (UNFRACTIONATED): HEPARIN UNFRACTIONATED: 0.53 [IU]/mL (ref 0.30–0.70)

## 2015-12-19 LAB — PROTEIN S, TOTAL: PROTEIN S AG TOTAL: 121 % (ref 60–150)

## 2015-12-19 LAB — PROTEIN C ACTIVITY: Protein C Activity: 133 % (ref 73–180)

## 2015-12-19 LAB — PROTEIN S ACTIVITY: PROTEIN S ACTIVITY: 97 % (ref 63–140)

## 2015-12-19 NOTE — Progress Notes (Signed)
ANTICOAGULATION CONSULT NOTE - Follow Up Consult  Pharmacy Consult for Heparin Indication: pulmonary embolus and DVT  No Known Allergies  Patient Measurements: Height:  (160 cm) Weight: 163 lb 9.3 oz (74.2 kg) IBW/kg (Calculated) : 52.4 Heparin Dosing Weight: 68 kg  Vital Signs: Temp: 97.6 F (36.4 C) (03/01 0508) Temp Source: Oral (03/01 0508) BP: 115/35 mmHg (03/01 0508) Pulse Rate: 85 (03/01 0508)  Labs:  Recent Labs  12/17/15 1609 12/18/15 0037 12/18/15 0602 12/18/15 1000 12/18/15 1144 12/18/15 2026 12/19/15 0429  HGB 12.3  --  11.7*  --   --   --  10.0*  HCT 38.8  --  36.7  --   --   --  31.8*  PLT 223  --  238  --   --   --  229  APTT  --  32  --   --   --   --   --   LABPROT  --  14.2  --   --   --   --   --   INR  --  1.13  --   --   --   --   --   HEPARINUNFRC  --   --   --  0.77*  --  0.55 0.53  CREATININE 0.49  --  0.46  --   --   --   --   TROPONINI <0.03 <0.03 <0.03  --  <0.03  --   --     Estimated Creatinine Clearance: 46 mL/min (by C-G formula based on Cr of 0.46).   Medications:  Infusions:  . heparin 900 Units/hr (12/18/15 2232)    Assessment: [name] presented to ED on 2/27 with c/o arm pain, SOB, bilateral leg swelling.  PMH significant for HTN, dCHF, severe aortic stenosis, and recent fall resulting in right wrist fracture, and small subarachnoid hemorrhage discharged to rehab at [name] and had transferred home about 3 days ago.  She sustained a left humerus fracture during this transport to ED.  Neurosurgery discussed history of subarachnoid bleed and and agrees with starting anticoagulation since repeat CT head today shows resolution of bleeding.  Pharmacy is consulted to dose Heparin IV.  2/27 CTa w/ bilateral occlusive PE.   2/28 LE venous duplex w/ RLE DVT  Today, 12/19/2015:  HL 0.53, therapeutic on Heparin at 9 ml/hr  CBC:  Hgb decreasing (12.3 > 11.7 > 10), but Plt remain WNL  No bleeding or complications reported by  RN or patient.  Diet: heart healthy   Goal of Therapy:  Heparin level 0.3-0.7 units/ml Monitor platelets by anticoagulation protocol: Yes   Plan:   Continue heparin IV infusion at 900 units/hr (9 ml/hr)  Daily heparin level and CBC  Continue to monitor for s/s bleeding or thrombosis.  Follow up long-term anticoagulation plan.  Lynann Beaver PharmD, BCPS Pager [phone] 12/19/2015 7:04 AM

## 2015-12-19 NOTE — Evaluation (Signed)
Physical Therapy Evaluation Patient Details Name: [name] MRN: [id number] DOB: [birth year] Today's Date: 12/19/2015   History of Present Illness  82 yo female adm with SOB, felt a pop L shoulder when being transported by EMS, xray + L humerus fx; past medical history of Aortic valve disorders; Allergic rhinitis; Arthritis; Dyslipidemia; Obesity; Glaucoma; Hypercholesteremia; Vertigo; and Hypertension, R radius fx, hx of falls  Clinical Impression  Pt admitted with above diagnosis. Pt currently with functional limitations due to the deficits listed below (see PT Problem List).  Pt will benefit from skilled PT to increase their independence and safety with mobility to allow discharge to the venue listed below. EVAL limited to bed level d/t unstable humerus fx per ortho MD; Dr. Shelle Iron advised against sitting pt  EOB until splint revised and further immobilization.     Follow Up Recommendations SNF;Supervision/Assistance - 24 hour    Equipment Recommendations  None recommended by PT    Recommendations for Other Services       Precautions / Restrictions Precautions Precautions: Fall Restrictions Weight Bearing Restrictions: Yes LUE Weight Bearing: Non weight bearing      Mobility  Bed Mobility Overal bed mobility: Needs Assistance Bed Mobility: Rolling Rolling: Total assist;+2 for physical assistance         General bed mobility comments: partial roll, pt is very painful with mobility but fairly comfortable at rest   Transfers                 General transfer comment: NT per recommendations of ortho MD to hold EOB until splint adjusted/addtional immobilization  Ambulation/Gait                Stairs            Wheelchair Mobility    Modified Rankin (Stroke Patients Only)       Balance                                             Pertinent Vitals/Pain Pain Assessment: No/denies pain (pt later says her L shoulder is sore)     Home Living Family/patient expects to be discharged to:: Private residence Living Arrangements: Alone Available Help at Discharge: Family Type of Home: House Home Access: Stairs to enter Entrance Stairs-Rails: Doctor, general practice of Steps: a few Home Layout: One level Home Equipment: Environmental consultant - 2 wheels;Cane - single point;Bedside commode;Shower seat Additional Comments: pt reports her dtr is moving in with her     Prior Function Level of Independence: Needs assistance   Gait / Transfers Assistance Needed: using cane at baseline           Hand Dominance        Extremity/Trunk Assessment   Upper Extremity Assessment: RUE deficits/detail;LUE deficits/detail RUE Deficits / Details: wrist splint; shoulder flexion to 90*, elbow AROM WFL, strength grossly 3/5     LUE Deficits / Details: extends and flexes digits; NT further    bil LE strength grossly 3/5; AROM WFL         Communication   Communication: HOH  Cognition Arousal/Alertness: Awake/alert Behavior During Therapy: WFL for tasks assessed/performed Overall Cognitive Status: Within Functional Limits for tasks assessed                      General Comments      Exercises  Assessment/Plan    PT Assessment Patient needs continued PT services  PT Diagnosis Generalized weakness   PT Problem List Decreased strength;Decreased activity tolerance;Decreased mobility;Pain;Decreased knowledge of precautions;Decreased balance;Decreased range of motion  PT Treatment Interventions DME instruction;Functional mobility training;Therapeutic activities;Gait training;Therapeutic exercise;Patient/family education   PT Goals (Current goals can be found in the Care Plan section) Acute Rehab PT Goals Patient Stated Goal: pt wants to get back home with her dtr PT Goal Formulation: With patient Time For Goal Achievement: 01/02/16 Potential to Achieve Goals: Fair    Frequency Min 2X/week    Barriers to discharge        Co-evaluation               End of Session   Activity Tolerance: Other (comment) Patient left: with call bell/phone within reach;in bed;with bed alarm set           Time: 6578-4696 PT Time Calculation (min) (ACUTE ONLY): 15 min   Charges:   PT Evaluation $PT Eval Moderate Complexity: 1 Procedure     PT G Codes:        [name] Jan 02, 2016, 11:34 AM

## 2015-12-19 NOTE — Progress Notes (Signed)
PROGRESS NOTE  [name] WUJ:[id number] DOB: Mar 06, 1924 DOA: 12/17/2015 PCP: Allean Found, MD  Summary: 82 year old woman recently discharged from [name] and I'll see with home health PMH severe aortic stenosis, recent hospitalization for a fall resulting in subarachnoid hemorrhage and right wrist fracture; presented with shortness of breath and acute left humerus fracture great found to have multiple subsegmental and segmental pulmonary emboli. Case was discussed with neurosurgery given recent subarachnoid bleed and patient was cleared to begin heparin infusion.  Assessment/Plan: 1. Bilateral acute pulmonary emboli. Echocardiogram pending, heart strain considered by CT findings however no hemodynamic instability and no hypoxia. Troponins negative. EKG sinus rhythm, nonacute. 2. Right lower extremity DVT 3. Left humerus fracture. Continue sling/splint, elevation, ice. Appreciate orthopedics evaluation.  4. Normocytic anemia. Suspect dilution. No evidence of obvious bleeding. Follow clinically.  5. PMH small traumatic subarachnoid hemorrhage, Colles' fracture right wrist status post fall 10/2015. Asymptomatic. Repeat CT scan on admission showed no evidence of bleeding. Admitting physician discussed the case with neurosurgery felt it was safe to anticoagulate. 6. Severe aortic stenosis. Seen by cardiology in past, felt to be candidate for TAVR, referred to primary cardiologist for timing of procedure.   Appears to be stable. Continue heparin. Start warfarin 3/2 if hemoglobin stable. Follow-up echocardiogram.  CBC in the morning  Code Status: DNR/DNI DVT prophylaxis: heparin Family Communication: daughter [name]  Disposition Plan: home when improved  Brendia Sacks, MD  [facility]  Pager [phone] If 7PM-7AM, please contact night-coverage at [email or web address], password [facility] 12/19/2015, 9:41 AM  LOS: 1 day   Consultants:  Orthopedics   ST: regular  diet  Procedures:    Antibiotics:    HPI/Subjective: Some arm pain was otherwise feeling well. Breathing well.  Objective: Filed Vitals:   12/18/15 0628 12/18/15 1430 12/18/15 2114 12/19/15 0508  BP: 154/75 165/92 143/63 115/35  Pulse: 82 94 100 85  Temp: 97.8 F (36.6 C) 97.7 F (36.5 C) 97.5 F (36.4 C) 97.6 F (36.4 C)  TempSrc: Oral Oral Oral Oral  Resp: Height:      Weight:      SpO2: 96% 98% 100% 100%    Intake/Output Summary (Last 24 hours) at 12/19/15 0941 Last data filed at 12/19/15 0600  Gross per 24 hour  Intake    211 ml  Output      0 ml  Net    211 ml     Filed Weights   12/18/15 0537  Weight: 74.2 kg (163 lb 9.3 oz)    Exam:    General:  Appears calm and comfortable Cardiovascular: RRR, no m/r/g. No LE edema. Telemetry: SR, no arrhythmias  Respiratory: CTA bilaterally, no w/r/r. Normal respiratory effort. Psychiatric: slightly confused New data reviewed:  Hemoglobin down to 10.0  Scheduled Meds: . ezetimibe-simvastatin  1 tablet Oral q1800  . guaiFENesin  600 mg Oral BID  . latanoprost  1 drop Both Eyes QHS  . metoprolol succinate  25 mg Oral Daily  . polyethylene glycol  17 g Oral Daily  . senna-docusate  1 tablet Oral BID  . sodium chloride flush  3 mL Intravenous Q12H   Continuous Infusions: . heparin 900 Units/hr (12/18/15 2232)    Principal Problem:   Pulmonary embolism (HCC) Active Problems:   Aortic stenosis, severe   Essential hypertension   SAH (subarachnoid hemorrhage) (HCC)   Diastolic dysfunction-grade 2   Fracture of humerus, left, closed   Humerus fracture

## 2015-12-19 NOTE — Progress Notes (Signed)
On call provider Craige Cotta, contacted to report that ultram and tylenol pain medications have not been effective in managing patient pain  and that patient has been tearful, and anxious.  Pt request medication for sleep.  Pt was order Norco for relief.

## 2015-12-19 NOTE — Progress Notes (Signed)
2Subjective Better   Objective: Vital signs in last 24 hours: Temp:  [97.4 F (36.3 C)-97.6 F (36.4 C)] 97.4 F (36.3 C) (03/01 1319) Pulse Rate:  [85-100] 92 (03/01 1319) Resp:  [20] 20 (03/01 1319) BP: (115-149)/(35-92) 149/92 mmHg (03/01 1319) SpO2:  [100 %] 100 % (03/01 1319)  Intake/Output from previous day: 02/28 0701 - 03/01 0700 In: 211 [I.V.:211] Out: -  Intake/Output this shift: Total I/O In: 443.3 [P.O.:360; I.V.:83.3] Out: -    Recent Labs  12/17/15 1609 12/18/15 0602 12/19/15 0429  HGB 12.3 11.7* 10.0*    Recent Labs  12/18/15 0602 12/19/15 0429  WBC 9.7 11.1*  RBC 4.03 3.54*  HCT 36.7 31.8*  PLT 238 229    Recent Labs  12/17/15 1609 12/18/15 0602  NA 139 139  K 3.8 3.8  CL 106 107  CO2 24 25  BUN 16 10  CREATININE 0.49 0.46  GLUCOSE 91 112*  CALCIUM 8.8* 8.5*    Recent Labs  12/18/15 0037  INR 1.13    Neurologically intact Intact pulses distally Compartment soft Posterior splint intact. Assessment/Plan:  Proximal humerus fracture. Better to use immobilizer as splint is heavy. Discussed with [facility]. Continue NV checks   Zeanna Sunde C 12/19/2015, 5:18 PM

## 2015-12-19 NOTE — Evaluation (Signed)
Clinical/Bedside Swallow Evaluation Patient Details  Name: [name] MRN: [id number] Date of Birth: 1924/08/20  Today's Date: 12/19/2015 Time: SLP Start Time (ACUTE ONLY): 0835 SLP Stop Time (ACUTE ONLY): 0845 SLP Time Calculation (min) (ACUTE ONLY): 10 min  Past Medical History:  Past Medical History  Diagnosis Date  . Aortic valve disorders   . Allergic rhinitis   . Arthritis   . Dyslipidemia   . Obesity   . Glaucoma   . Hypercholesteremia   . Vertigo   . Hypertension    Past Surgical History:  Past Surgical History  Procedure Laterality Date  . Appendectomy    . Shoulder surgery    . Bilataral cataracts removal with lens replacement dr. Darel Hong    . Cardiac catheterization      normal coronary arteries   HPI:  82 yo female adm to [facility] with pulmonary embolism.  Pt PMH + left humerus fx, right arm surgery also preventing use of arms.  PMH + for allergic rhinitis, aortic valve d/o, obesity, HTN, vertigo and glaucoma.  Pt has a small hiatal hernia per imaging study.  Swallow eval ordered.     Assessment / Plan / Recommendation Clinical Impression  Pt presents with functional oropharyngeal swallow, negative cranial nerve exam.  Strong voice and cough noted.  Timely swallow without pocketing apparent.  Pt denies dysphagia prior to admit or currently.  Pt will require total assist for feeding due to her bilateral arm injuries.  Recommend regular/thin with general precautions given pt has small hiatal hernia.  No follow up.  Thanks.      Aspiration Risk  No limitations    Diet Recommendation Regular;Thin liquid   Medication Administration: Whole meds with liquid Supervision: Staff to assist with self feeding Compensations: Slow rate;Small sips/bites    Other  Recommendations Oral Care Recommendations: Oral care BID   Follow up Recommendations  None    Frequency and Duration            Prognosis   good     Swallow Study   General Date of Onset: 12/19/15 HPI:  82 yo female adm to [facility] with pulmonary embolism.  Pt PMH + left humerus fx, right arm surgery also preventing use of arms.  PMH + for allergic rhinitis, aortic valve d/o, obesity, HTN, vertigo and glaucoma.  Pt has a small hiatal hernia per imaging study.  Swallow eval ordered.   Type of Study: Bedside Swallow Evaluation Diet Prior to this Study: Regular;Thin liquids Respiratory Status: Room air History of Recent Intubation: No Behavior/Cognition: Alert;Cooperative;Pleasant mood Oral Cavity Assessment: Within Functional Limits Oral Care Completed by SLP: No Oral Cavity - Dentition: Adequate natural dentition Vision: Impaired for self-feeding Self-Feeding Abilities: Total assist (pt has two broken arms) Patient Positioning: Upright in bed Baseline Vocal Quality: Normal Volitional Cough: Strong Volitional Swallow: Able to elicit    Oral/Motor/Sensory Function Overall Oral Motor/Sensory Function: Within functional limits   Ice Chips Ice chips: Not tested   Thin Liquid Thin Liquid: Within functional limits Presentation: Straw    Nectar Thick Nectar Thick Liquid: Not tested   Honey Thick Honey Thick Liquid: Not tested   Puree Puree: Not tested   Solid   GO   Solid: Within functional limits        Donavan Burnet, MS [facility] SLP [phone]

## 2015-12-20 ENCOUNTER — Inpatient Hospital Stay (HOSPITAL_COMMUNITY): Payer: Medicare Other

## 2015-12-20 DIAGNOSIS — I2699 Other pulmonary embolism without acute cor pulmonale: Principal | ICD-10-CM

## 2015-12-20 DIAGNOSIS — I350 Nonrheumatic aortic (valve) stenosis: Secondary | ICD-10-CM

## 2015-12-20 DIAGNOSIS — I10 Essential (primary) hypertension: Secondary | ICD-10-CM

## 2015-12-20 DIAGNOSIS — I519 Heart disease, unspecified: Secondary | ICD-10-CM

## 2015-12-20 DIAGNOSIS — S42302A Unspecified fracture of shaft of humerus, left arm, initial encounter for closed fracture: Secondary | ICD-10-CM

## 2015-12-20 DIAGNOSIS — I609 Nontraumatic subarachnoid hemorrhage, unspecified: Secondary | ICD-10-CM

## 2015-12-20 LAB — CBC
HEMATOCRIT: 28.6 % — AB (ref 36.0–46.0)
HEMOGLOBIN: 9.3 g/dL — AB (ref 12.0–15.0)
MCH: 29.1 pg (ref 26.0–34.0)
MCHC: 32.5 g/dL (ref 30.0–36.0)
MCV: 89.4 fL (ref 78.0–100.0)
Platelets: 230 10*3/uL (ref 150–400)
RBC: 3.2 MIL/uL — ABNORMAL LOW (ref 3.87–5.11)
RDW: 15.3 % (ref 11.5–15.5)
WBC: 11.3 10*3/uL — ABNORMAL HIGH (ref 4.0–10.5)

## 2015-12-20 LAB — CARDIOLIPIN ANTIBODIES, IGG, IGM, IGA

## 2015-12-20 LAB — BETA-2-GLYCOPROTEIN I ABS, IGG/M/A
Beta-2 Glyco I IgG: <9 GPI IgG units (ref 0–20)
Beta-2-Glycoprotein I IgA: <9 GPI IgA units (ref 0–25)
Beta-2-Glycoprotein I IgM: <9 GPI IgM units (ref 0–32)

## 2015-12-20 LAB — HEPARIN LEVEL (UNFRACTIONATED): Heparin Unfractionated: 0.47 IU/mL (ref 0.30–0.70)

## 2015-12-20 NOTE — Progress Notes (Signed)
ANTICOAGULATION CONSULT NOTE - Follow Up Consult  Pharmacy Consult for Heparin Indication: pulmonary embolus and DVT  No Known Allergies  Patient Measurements: Height:  (160 cm) Weight: 163 lb 9.3 oz (74.2 kg) IBW/kg (Calculated) : 52.4 Heparin Dosing Weight: 68 kg  Vital Signs: Temp: 98.7 F (37.1 C) (03/02 0213) Temp Source: Oral (03/02 0213) BP: 135/58 mmHg (03/02 0213) Pulse Rate: 78 (03/02 0213)  Labs:  Recent Labs  12/17/15 1609 12/18/15 0037 12/18/15 0602  12/18/15 1144 12/18/15 2026 12/19/15 0429 12/20/15 0457  HGB 12.3  --  11.7*  --   --   --  10.0* 9.3*  HCT 38.8  --  36.7  --   --   --  31.8* 28.6*  PLT 223  --  238  --   --   --  229 230  APTT  --  32  --   --   --   --   --   --   LABPROT  --  14.2  --   --   --   --   --   --   INR  --  1.13  --   --   --   --   --   --   HEPARINUNFRC  --   --   --   < >  --  0.55 0.53 0.47  CREATININE 0.49  --  0.46  --   --   --   --   --   TROPONINI <0.03 <0.03 <0.03  --  <0.03  --   --   --   < > = values in this interval not displayed.  Estimated Creatinine Clearance: 46 mL/min (by C-G formula based on Cr of 0.46).   Medications:  Infusions:  . heparin 900 Units/hr (12/20/15 0111)    Assessment: 35 yoF presented to ED on 2/27 with c/o arm pain, SOB, bilateral leg swelling.  PMH significant for HTN, dCHF, severe aortic stenosis, and recent fall resulting in right wrist fracture, and small subarachnoid hemorrhage discharged to rehab at [name] and had transferred home about 3 days ago.  She sustained a left humerus fracture during this transport to ED.  Neurosurgery discussed history of subarachnoid bleed and and agrees with starting anticoagulation since repeat CT head today shows resolution of bleeding.  Pharmacy is consulted to dose Heparin IV.  2/27 CTa w/ bilateral occlusive PE.   2/28 LE venous duplex w/ RLE DVT  Today, 12/20/2015:  HL 0.47, therapeutic on Heparin at 9 ml/hr  CBC:  Hgb  decreasing (12.3 > 11.7 > 10 > 9.3), but Plt remain WNL  No bleeding or complications reported by RN or patient.  Diet: heart healthy   Goal of Therapy:  Heparin level 0.3-0.7 units/ml Monitor platelets by anticoagulation protocol: Yes   Plan:   Continue heparin IV infusion at 900 units/hr (9 ml/hr)  Daily heparin level and CBC  Continue to monitor for s/s bleeding or thrombosis.  Follow up long-term anticoagulation plan.  Lynann Beaver PharmD, BCPS Pager [phone] 12/20/2015 7:12 AM

## 2015-12-20 NOTE — Progress Notes (Signed)
Subjective: Left shoulder pain. No change   Objective: Vital signs in last 24 hours: Temp:  [97.4 F (36.3 C)-98.7 F (37.1 C)] 98.7 F (37.1 C) (03/02 0213) Pulse Rate:  [78-101] 78 (03/02 0213) Resp:  [18-20] 18 (03/02 0213) BP: (135-149)/(58-98) 135/58 mmHg (03/02 0213) SpO2:  [100 %] 100 % (03/02 0213)  Intake/Output from previous day: 03/01 0701 - 03/02 0700 In: 477 [P.O.:360; I.V.:117] Out: -  Intake/Output this shift: Total I/O In: 120 [P.O.:120] Out: -    Recent Labs  12/17/15 1609 12/18/15 0602 12/19/15 0429 12/20/15 0457  HGB 12.3 11.7* 10.0* 9.3*    Recent Labs  12/19/15 0429 12/20/15 0457  WBC 11.1* 11.3*  RBC 3.54* 3.20*  HCT 31.8* 28.6*  PLT 229 230    Recent Labs  12/17/15 1609 12/18/15 0602  NA 139 139  K 3.8 3.8  CL 106 107  CO2 24 25  BUN 16 10  CREATININE 0.49 0.46  GLUCOSE 91 112*  CALCIUM 8.8* 8.5*    Recent Labs  12/18/15 0037  INR 1.13    Neurologically intact Neurovascular intact Intact pulses distally  Assessment/Plan: Proximal humerus fracture. Splint not yet removed. Called [employer] to remove and shoulder immobilizer. PT  [name] C 12/20/2015, 12:35 PM

## 2015-12-20 NOTE — Clinical Social Work Note (Signed)
Clinical Social Work Assessment  Patient Details  Name: [name] MRN: [id number] Date of Birth: 02/05/1924  Date of referral:  12/20/15               Reason for consult:  Facility Placement                Permission sought to share information with:  Oceanographer granted to share information::  Yes, Verbal Permission Granted  Name::        Agency::     Relationship::     Contact Information:     Housing/Transportation Living arrangements for the past 2 months:  Single Family Home Source of Information:  Patient, Adult Children Patient Interpreter Needed:  None Criminal Activity/Legal Involvement Pertinent to Current Situation/Hospitalization:  No - Comment as needed Significant Relationships:  None Lives with:  Adult Children Do you feel safe going back to the place where you live?  No Need for family participation in patient care:  Yes (Comment)  Care giving concerns:  CSW reviewed PT evaluation recommending SNF;Supervison/assistance 24 hours at discharge - CSW spoke with patient who instructed CSW to speak with her daughter, Olegario Messier (ph#: [phone]).    Social Worker assessment / plan:  Patient's daughter informed CSW that she was sent to [facility] 1/27 and was just released from [facility] on Friday, 2/24. Per daughter, she did not have a great experience at [facility] and fractured her wrist while she was there. While patient was home this past weekend, they had called EMS after she had fallen and they assisted her to the bathroom - during which, her shoulder was pulled out of the socket and ended up with a hairline fracture. Patient's daughter states that Joetta Manners had set patient up with [facility] services and Herbert Seta from [facility] had come out on Saturday to evaluate and took her vitals, stayed 3 hours and the daughter perceived that the therapist was supposed to come out on Sunday but never showed.    Employment status:   Retired Database administrator PT Recommendations:  Skilled Nursing Facility Information / Referral to community resources:  Skilled Nursing Facility  Patient/Family's Response to care:  Patient's daughters are anxious about patient being discharged and the discharge plan - they are afraid they will have another unpleasant experience at SNF and are afraid that she will be discharged before she is ready. Per daughter, they appealed the discharge at [facility] and [facility] ended up denying the appeal.   Patient/Family's Understanding of and Emotional Response to Diagnosis, Current Treatment, and Prognosis:  Patient's daughter feels helpless about patient's continuous issues (wrist fracture, shoulder fracture etc..) and are afraid of what other issues may arise.   Emotional Assessment Appearance:  Appears stated age Attitude/Demeanor/Rapport:    Affect (typically observed):  Calm, Pleasant Orientation:  Oriented to Self, Oriented to Place, Oriented to  Time, Oriented to Situation Alcohol / Substance use:    Psych involvement (Current and /or in the community):     Discharge Needs  Concerns to be addressed:    Readmission within the last 30 days:    Current discharge risk:    Barriers to Discharge:      Arlyss Repress, LCSW 12/20/2015, 9:37 AM

## 2015-12-20 NOTE — Progress Notes (Signed)
  Echocardiogram 2D Echocardiogram has not been performed. I attempted to perform echo on 12/19/15 but therapist was with the patient and per the therapist "they were waiting for the doctor to come in". I attempted echo on 12/20/15. The patient was in the recliner. I requested that the patient be put back in the bed so I could perform the echocardiogram. (Per nurse) "She wants the patient to stay up in the recliner for a while". I informed the nurse that the echo was ordered on 12/18/15 then I proceeded to my next patient.   Cathie Beams 12/20/2015, 12:07 PM

## 2015-12-20 NOTE — Progress Notes (Signed)
PROGRESS NOTE  [name] WUJ:[id number] DOB: 11-06-1923 DOA: 12/17/2015 PCP: Allean Found, MD   HPI/Subjective: Denies any new complaints, patient is pleasantly demented.  Summary: 82 year old woman recently discharged from [name] and I'll see with home health PMH severe aortic stenosis, recent hospitalization for a fall resulting in subarachnoid hemorrhage and right wrist fracture; presented with shortness of breath and acute left humerus fracture great found to have multiple subsegmental and segmental pulmonary emboli. Case was discussed with neurosurgery given recent subarachnoid bleed and patient was cleared to begin heparin infusion.  Assessment/Plan: 1. Bilateral acute pulmonary emboli. Echocardiogram pending, heart strain considered by CT findings however no hemodynamic instability and no hypoxia. Troponins negative. No acute changes in her EKG. 2. Right lower extremity DVT. 3. Left humerus fracture. Continue sling/splint, elevation, ice. Remove the splinter and place immobilizer. 4. Normocytic anemia. Suspect dilution. No evidence of obvious bleeding. Follow clinically.  5. PMH small traumatic subarachnoid hemorrhage, Colles' fracture right wrist status post fall 10/2015. Asymptomatic. Repeat CT scan on admission showed no evidence of bleeding. Admitting physician discussed the case with neurosurgery felt it was safe to anticoagulate. 6. Severe aortic stenosis. Seen by cardiology in past, felt to be candidate for TAVR, referred to primary cardiologist for timing of procedure.   Appears to be stable. Continue heparin. Will to GI echo, likely will switch to Eliquis rather than warfarin. CBC in the morning  Code Status: DNR/DNI DVT prophylaxis: heparin Family Communication: I had very prolonged discussion with her daughters Olegario Messier and [name].  Disposition Plan: home when improved  Clydia Llano, MD [facility]  Pager [phone] If 7PM-7AM, please contact  night-coverage at [email or web address], password [facility] 12/20/2015, 2:10 PM  LOS: 2 days   Consultants:  Orthopedics   ST: regular diet  Procedures:    Antibiotics:     Objective: Filed Vitals:   12/19/15 0508 12/19/15 1319 12/19/15 2210 12/20/15 0213  BP: 115/35 149/92 149/98 135/58  Pulse: 85 92 101 78  Temp: 97.6 F (36.4 C) 97.4 F (36.3 C) 98.6 F (37 C) 98.7 F (37.1 C)  TempSrc: Oral Oral Oral Oral  Resp: Height:      Weight:      SpO2: 100% 100% 100% 100%    Intake/Output Summary (Last 24 hours) at 12/20/15 1410 Last data filed at 12/20/15 1015  Gross per 24 hour  Intake    237 ml  Output      0 ml  Net    237 ml     Filed Weights   12/18/15 0537  Weight: 74.2 kg (163 lb 9.3 oz)    Exam:    General:  Appears calm and comfortable Cardiovascular: RRR, no m/r/g. No LE edema. Telemetry: SR, no arrhythmias  Respiratory: CTA bilaterally, no w/r/r. Normal respiratory effort. Psychiatric: slightly confused New data reviewed:  Hemoglobin down to 10.0  Scheduled Meds: . ezetimibe-simvastatin  1 tablet Oral q1800  . guaiFENesin  600 mg Oral BID  . latanoprost  1 drop Both Eyes QHS  . metoprolol succinate  25 mg Oral Daily  . polyethylene glycol  17 g Oral Daily  . senna-docusate  1 tablet Oral BID  . sodium chloride flush  3 mL Intravenous Q12H   Continuous Infusions: . heparin 900 Units/hr (12/20/15 0111)    Principal Problem:   Pulmonary embolism (HCC) Active Problems:   Aortic stenosis, severe   Essential hypertension   SAH (subarachnoid hemorrhage) (HCC)   Diastolic dysfunction-grade 2  Fracture of humerus, left, closed   Humerus fracture

## 2015-12-20 NOTE — Progress Notes (Addendum)
Physical Therapy Treatment Patient Details Name: [name] MRN: [id number] DOB: December 20, 1923 Today's Date: 12/20/2015    History of Present Illness 82 yo female adm with SOB, + for multiple PE, felt a pop L shoulder when being transported by EMS, xray + L humerus fx; past medical history of Aortic valve disorders; Allergic rhinitis; Arthritis; Dyslipidemia; Obesity; Glaucoma; Hypercholesteremia; Vertigo; and Hypertension, R radius fx, hx of falls    PT Comments    Pt progressing with bed mobility and transfers; continue to recommend SNF unless dtr (pt states dtr is moving in with her) can provide 24hr care  Follow Up Recommendations  SNF;Supervision/Assistance - 24 hour     Equipment Recommendations  None recommended by PT    Recommendations for Other Services       Precautions / Restrictions Precautions Precautions: Fall Required Braces or Orthoses: Other Brace/Splint Other Brace/Splint: R wrist splint; L shoulder immobilizer; Dr. Shelle Iron is possibly changing method of immobilization Restrictions LUE Weight Bearing: Non weight bearing Other Position/Activity Restrictions: support l UE in bed/chair per Dr. Shelle Iron    Mobility  Bed Mobility Overal bed mobility: Needs Assistance Bed Mobility: Rolling;Supine to Sit Rolling: Mod assist;+2 for physical assistance;+2 for safety/equipment   Supine to sit: Min assist;Mod assist     General bed mobility comments: pt able to roll to R with assist for hips and trunk; able to come to sit with cues to avoid using LUE, assist for trunk  Transfers Overall transfer level: Needs assistance Equipment used: None Transfers: Sit to/from Stand;Stand Pivot Transfers Sit to Stand: Min guard Stand pivot transfers: Min assist       General transfer comment: assist to steady during stand-step transfer to chair  Ambulation/Gait                 Stairs            Wheelchair Mobility    Modified Rankin (Stroke Patients  Only)       Balance Overall balance assessment: History of Falls                                  Cognition Arousal/Alertness: Awake/alert Behavior During Therapy: WFL for tasks assessed/performed Overall Cognitive Status: Within Functional Limits for tasks assessed                      Exercises      General Comments General comments (skin integrity, edema, etc.): sling adjusted to provide better support prior to transfer to chair      Pertinent Vitals/Pain Pain Assessment: No/denies pain    Home Living                      Prior Function            PT Goals (current goals can now be found in the care plan section) Acute Rehab PT Goals Patient Stated Goal: pt wants to get back home with her dtr PT Goal Formulation: With patient Time For Goal Achievement: 01/02/16 Potential to Achieve Goals: Fair Progress towards PT goals: Progressing toward goals    Frequency  Min 2X/week    PT Plan Current plan remains appropriate    Co-evaluation             End of Session   Activity Tolerance: Patient tolerated treatment well Patient left: in chair;with call bell/phone within reach;with chair alarm set  Time: 1016-1030 PT Time Calculation (min) (ACUTE ONLY): 14 min  Charges:  $Therapeutic Activity: 8-22 mins                    G Codes:      Drena Ham 12-22-2015, 10:54 AM

## 2015-12-21 ENCOUNTER — Inpatient Hospital Stay (HOSPITAL_COMMUNITY): Payer: Medicare Other

## 2015-12-21 DIAGNOSIS — I2699 Other pulmonary embolism without acute cor pulmonale: Secondary | ICD-10-CM

## 2015-12-21 LAB — CBC
HEMATOCRIT: 27.9 % — AB (ref 36.0–46.0)
HEMOGLOBIN: 8.9 g/dL — AB (ref 12.0–15.0)
MCH: 29 pg (ref 26.0–34.0)
MCHC: 31.9 g/dL (ref 30.0–36.0)
MCV: 90.9 fL (ref 78.0–100.0)
Platelets: 254 10*3/uL (ref 150–400)
RBC: 3.07 MIL/uL — AB (ref 3.87–5.11)
RDW: 15.8 % — ABNORMAL HIGH (ref 11.5–15.5)
WBC: 11.5 10*3/uL — ABNORMAL HIGH (ref 4.0–10.5)

## 2015-12-21 LAB — HEPARIN LEVEL (UNFRACTIONATED): Heparin Unfractionated: 0.2 IU/mL — ABNORMAL LOW (ref 0.30–0.70)

## 2015-12-21 MED ORDER — APIXABAN 5 MG PO TABS
10.0000 mg | ORAL_TABLET | Freq: Two times a day (BID) | ORAL | Status: DC
  Administered 2015-12-21 – 2015-12-25 (×9): 10 mg via ORAL
  Filled 2015-12-21 (×9): qty 2

## 2015-12-21 MED ORDER — OXYCODONE-ACETAMINOPHEN 5-325 MG PO TABS: 1.0000 | ORAL_TABLET | ORAL | Status: DC | PRN

## 2015-12-21 MED ORDER — APIXABAN 5 MG PO TABS: ORAL_TABLET | ORAL | Status: DC

## 2015-12-21 MED ORDER — APIXABAN 5 MG PO TABS: 5.0000 mg | ORAL_TABLET | Freq: Two times a day (BID) | ORAL | Status: DC

## 2015-12-21 NOTE — Progress Notes (Signed)
Echocardiogram 2D Echocardiogram has been performed.  [name], Tony 12/21/2015, 12:58 PM

## 2015-12-21 NOTE — NC FL2 (Signed)
[place] MEDICAID FL2 LEVEL OF CARE SCREENING TOOL     IDENTIFICATION  Patient Name: TALINE NASS Birthdate: July 24, 1924 Sex: female Admission Date (Current Location): 12/17/2015  [facility] and [place] Number:  Producer, television/film/video and Address:  [facility],  [address], [place] [address]      Provider Number: [id number]  Attending Physician Name and Address:  Clydia Llano, MD  Relative Name and Phone Number:       Current Level of Care: Hospital Recommended Level of Care: Skilled Nursing Facility Prior Approval Number:    Date Approved/Denied:   PASRR Number: [id number]  Discharge Plan: SNF    Current Diagnoses: Patient Active Problem List   Diagnosis Date Noted  . Humerus fracture 12/18/2015  . Pulmonary embolism (HCC) 12/18/2015  . Acute encephalopathy   . Fracture of humerus, left, closed 12/17/2015  . SAH (subarachnoid hemorrhage) (HCC) 11/16/2015  . Diastolic dysfunction-grade 2 11/16/2015  . Wrist fracture 11/14/2015  . Fall 11/14/2015  . Essential hypertension   . Hypercholesteremia   . Aortic stenosis, severe   . Allergic rhinitis   . Arthritis   . Dyslipidemia   . Obesity     Orientation RESPIRATION BLADDER Height & Weight     Self, Time, Situation, Place  Normal Incontinent Weight: 163 lb 9.3 oz (74.2 kg) Height:   (160 cm)  BEHAVIORAL SYMPTOMS/MOOD NEUROLOGICAL BOWEL NUTRITION STATUS      Incontinent Diet (Heart Healthy)  AMBULATORY STATUS COMMUNICATION OF NEEDS Skin   Extensive Assist Verbally Normal                       Personal Care Assistance Level of Assistance  Bathing, Dressing Bathing Assistance: Limited assistance   Dressing Assistance: Limited assistance     Functional Limitations Info             SPECIAL CARE FACTORS FREQUENCY  PT (By licensed PT), OT (By licensed OT)     PT Frequency: 5 OT Frequency: 5            Contractures      Additional Factors Info  Code Status,  Allergies Code Status Info: DNR Allergies Info: NKDA           Current Medications (12/21/2015):  This is the current hospital active medication list Current Facility-Administered Medications  Medication Dose Route Frequency Provider Last Rate Last Dose  . acetaminophen (TYLENOL) tablet 650 mg  650 mg Oral Q6H PRN Therisa Doyne, MD   650 mg at 12/21/15 8469   Or  . acetaminophen (TYLENOL) suppository 650 mg  650 mg Rectal Q6H PRN Therisa Doyne, MD      . albuterol (PROVENTIL) (2.5 MG/3ML) 0.083% nebulizer solution 2.5 mg  2.5 mg Nebulization Q4H PRN Therisa Doyne, MD      . albuterol (PROVENTIL) (2.5 MG/3ML) 0.083% nebulizer solution 2.5 mg  2.5 mg Nebulization Q2H PRN Therisa Doyne, MD      . apixaban (ELIQUIS) tablet 10 mg  10 mg Oral BID Otho Bellows, RPH      . [START ON 12/28/2015] apixaban (ELIQUIS) tablet 5 mg  5 mg Oral BID Otho Bellows, RPH      . bisacodyl (DULCOLAX) suppository 10 mg  10 mg Rectal Daily PRN Therisa Doyne, MD      . diphenhydrAMINE (BENADRYL) 12.5 MG/5ML elixir 12.5 mg  12.5 mg Oral QHS PRN Leda Gauze, NP   12.5 mg at 12/18/15 2116  .  ezetimibe-simvastatin (VYTORIN) 10-20 MG per tablet 1 tablet  1 tablet Oral q1800 Therisa DoyneAnastassia Doutova, MD   1 tablet at 12/20/15 1735  . guaiFENesin (MUCINEX) 12 hr tablet 600 mg  600 mg Oral BID Therisa DoyneAnastassia Doutova, MD   600 mg at 12/20/15 2215  . heparin ADULT infusion 100 units/mL (25000 units/250 mL)  1,100 Units/hr Intravenous Continuous Otho Bellowserri L Green, RPH 11 mL/hr at 12/21/15 0713 1,100 Units/hr at 12/21/15 0713  . HYDROcodone-acetaminophen (NORCO/VICODIN) 5-325 MG per tablet 1 tablet  1 tablet Oral Q4H PRN Leda GauzeKaren J Kirby-Graham, NP   1 tablet at 12/20/15 0500  . latanoprost (XALATAN) 0.005 % ophthalmic solution 1 drop  1 drop Both Eyes QHS Therisa DoyneAnastassia Doutova, MD   1 drop at 12/20/15 2215  . metoprolol succinate (TOPROL-XL) 24 hr tablet 25 mg  25 mg Oral Daily Standley Brookinganiel P Goodrich, MD   25 mg at  12/20/15 1048  . ondansetron (ZOFRAN) tablet 4 mg  4 mg Oral Q6H PRN Therisa DoyneAnastassia Doutova, MD       Or  . ondansetron (ZOFRAN) injection 4 mg  4 mg Intravenous Q6H PRN Therisa DoyneAnastassia Doutova, MD      . polyethylene glycol (MIRALAX / GLYCOLAX) packet 17 g  17 g Oral Daily Therisa DoyneAnastassia Doutova, MD   17 g at 12/20/15 1048  . senna-docusate (Senokot-S) tablet 1 tablet  1 tablet Oral BID Therisa DoyneAnastassia Doutova, MD   1 tablet at 12/20/15 2215  . sodium chloride flush (NS) 0.9 % injection 3 mL  3 mL Intravenous Q12H Therisa DoyneAnastassia Doutova, MD   3 mL at 12/19/15 2149  . sodium phosphate (FLEET) 7-19 GM/118ML enema 1 enema  1 enema Rectal Once PRN Therisa DoyneAnastassia Doutova, MD      . traMADol (ULTRAM) tablet 50 mg  50 mg Oral Q8H PRN Leda GauzeKaren J Kirby-Graham, NP         Discharge Medications: Please see discharge summary for a list of discharge medications.  Relevant Imaging Results:  Relevant Lab Results:   Additional Information SSN: [id number]  Arlyss RepressHarrison, Tim Corriher F, LCSW

## 2015-12-21 NOTE — Clinical Social Work Placement (Signed)
CS[facility] that [facility] would be able to take patient over the weekend, patient's daughters aware of plan for discharge Saturday. [facility] obtained.    Please call weekend CSW (ph#: [phone]) to facilitate discharge.    Lincoln MaxinKelly Ahleah Simko, LCSW [facility] Clinical Social Worker cell #: [phone]   CLINICAL SOCIAL WORK PLACEMENT  NOTE  Date:  12/21/2015  Patient Details  Name: Karen DessertColleen M Vicuna MRN: [id number] Date of Birth: 14-Jun-1924  Clinical Social Work is seeking post-discharge placement for this patient at the Skilled  Nursing Facility level of care (*CSW will initial, date and re-position this form in  chart as items are completed):  Yes   Patient/family provided with [facility] Clinical Social Work Department's list of facilities offering this level of care within the geographic area requested by the patient (or if unable, by the patient's family).  Yes   Patient/family informed of their freedom to choose among providers that offer the needed level of care, that participate in Medicare, Medicaid or managed care program needed by the patient, have an available bed and are willing to accept the patient.  Yes   Patient/family informed of [facility]'s ownership interest in [facility] and [facility], as well as of the fact that they are under no obligation to receive care at these facilities.  PASRR submitted to EDS on 12/21/15     PASRR number received on 12/21/15     Existing PASRR number confirmed on       FL2 transmitted to all facilities in geographic area requested by pt/family on 12/21/15     FL2 transmitted to all facilities within larger geographic area on       Patient informed that his/her managed care company has contracts with or will negotiate with certain facilities, including the following:        Yes   Patient/family informed of bed offers received.  [facility]     Physician recommends and patient chooses bed at      [facility]  .  Patient to be transferred to facility by       Patient family notified on   of transfer.  Name of family member notified:        PHYSICIAN       Additional Comment:    _______________________________________________ Arlyss RepressHarrison, Nyleah Mcginnis F, LCSW 12/21/2015, 2:52 PM

## 2015-12-21 NOTE — Progress Notes (Signed)
PROGRESS NOTE  [name] AOZ:[id number] DOB: [birth year] DOA: 12/17/2015 PCP: Allean FoundSMITH,Karen THIELE, MD   HPI/Subjective: He while she was getting the 2-D echo, no significant right heart strain. Mild pulmonary artery hypertension of 35 mmHg. Had prolonged discussion with daughters Karen MessierKathy and Karen DresserConnie, there were concerns about discharge. but I answered all their questions.  Summary: 82 year old woman recently discharged from [name] and I'll see with home Kelley PMH severe aortic stenosis, recent hospitalization for a fall resulting in subarachnoid hemorrhage and right wrist fracture; presented with shortness of breath and acute left humerus fracture great found to have multiple subsegmental and segmental pulmonary emboli. Case was discussed with neurosurgery given recent subarachnoid bleed and patient was cleared to begin heparin infusion.  Assessment/Plan:  Bilateral acute pulmonary emboli Heart strain considered by CT findings however no hemodynamic instability and no hypoxia.  Troponins negative. No acute changes in her EKG. 2-D echo showed no evidence of significant heart strain. Patient was on continuous heparin drip, switched to Eliquis, likely to be discharged in a.m. to nursing home.  Right lower extremity DVT Continue Eliquis for VTE treatment  Left humerus fracture.  Evaluated by orthopedics, recommended conservative nonoperative management with splint.  Normocytic anemia Suspect dilution. No evidence of obvious bleeding. Follow clinically.   Small traumatic subarachnoid hemorrhage, Colles' fracture right wrist status post fall 10/2015.  Asymptomatic. Repeat CT scan on admission showed no evidence of bleeding.  Admitting physician discussed the case with neurosurgery felt it was safe to anticoagulate.  Severe aortic stenosis Seen by cardiology in past, felt to be candidate for TAVR, referred to primary cardiologist for timing of procedure.    Code Status:  DNR/DNI DVT prophylaxis: heparin Family Communication: I another very prolonged discussion with her daughters Karen MessierKathy and Karen DresserConnie at bedside.  Disposition Plan: home when improved  Karen LlanoMutaz Silviano Neuser, MD [facility]  Pager [phone] If 7PM-7AM, please contact night-coverage at [email or web address], password [email or web address] 12/21/2015, 3:51 PM  LOS: 3 days   Consultants:  Orthopedics   ST: regular diet  Procedures:    Antibiotics:     Objective: Filed Vitals:   12/20/15 2140 12/21/15 0559 12/21/15 0929 12/21/15 1457  BP: 142/55 144/50 140/54 145/56  Pulse: 103 100 92 100  Temp: 98.6 F (37 C) 98.1 F (36.7 C)  98.2 F (36.8 C)  TempSrc: Oral Oral  Oral  Resp: 20 20    Height:      Weight:      SpO2: 97% 98%  97%    Intake/Output Summary (Last 24 hours) at 12/21/15 1551 Last data filed at 12/21/15 1500  Gross per 24 hour  Intake    854 ml  Output      0 ml  Net    854 ml     Filed Weights   12/18/15 0537  Weight: 74.2 kg (163 lb 9.3 oz)    Exam:    General:  Appears calm and comfortable Cardiovascular: RRR, no m/r/g. No LE edema. Telemetry: SR, no arrhythmias  Respiratory: CTA bilaterally, no w/r/r. Normal respiratory effort. Psychiatric: slightly confused New data reviewed:  Hemoglobin down to 10.0  Scheduled Meds: . apixaban  10 mg Oral BID  . [START ON 12/28/2015] apixaban  5 mg Oral BID  . ezetimibe-simvastatin  1 tablet Oral q1800  . guaiFENesin  600 mg Oral BID  . latanoprost  1 drop Both Eyes QHS  . metoprolol succinate  25 mg Oral Daily  . polyethylene glycol  17 g Oral Daily  . senna-docusate  1 tablet Oral BID  . sodium chloride flush  3 mL Intravenous Q12H   Continuous Infusions:    Principal Problem:   Pulmonary embolism (HCC) Active Problems:   Aortic stenosis, severe   Essential hypertension   SAH (subarachnoid hemorrhage) (HCC)   Diastolic dysfunction-grade 2   Fracture of humerus, left, closed   Humerus fracture

## 2015-12-21 NOTE — Progress Notes (Addendum)
ANTICOAGULATION CONSULT NOTE - Follow Up Consult  Pharmacy Consult for Heparin Indication: pulmonary embolus and DVT  No Known Allergies  Patient Measurements: Height: 5\' 3"  (160 cm) Weight: 163 lb 9.3 oz (74.2 kg) IBW/kg (Calculated) : 52.4 Heparin Dosing Weight: 68 kg  Vital Signs: Temp: 98.6 F (37 C) (03/02 2140) Temp Source: Oral (03/02 2140) BP: 142/55 mmHg (03/02 2140) Pulse Rate: 103 (03/02 2140)  Labs:  Recent Labs  12/18/15 1144  12/19/15 0429 12/20/15 0457 12/21/15 0502  HGB  --   < > 10.0* 9.3* 8.9*  HCT  --   --  31.8* 28.6* 27.9*  PLT  --   --  229 230 254  HEPARINUNFRC  --   < > 0.53 0.47 0.20*  TROPONINI <0.03  --   --   --   --   < > = values in this interval not displayed.  Estimated Creatinine Clearance: 46 mL/min (by C-G formula based on Cr of 0.46).  Medications:  Infusions:  . heparin 900 Units/hr (12/21/15 [id number])   Assessment: [age] yoF presented to ED on 2/27 with c/o arm pain, SOB, bilateral leg swelling.  PMH significant for HTN, dCHF, severe aortic stenosis, and recent fall resulting in right wrist fracture, and small subarachnoid hemorrhage discharged to rehab at [name] and had transferred home about 3 days ago.  She sustained a left humerus fracture during this transport to ED.  Neurosurgery discussed history of subarachnoid bleed and and agrees with starting anticoagulation since repeat CT head today shows resolution of bleeding.  Pharmacy is consulted to dose Heparin IV.  2/27 CT w/ bilateral occlusive PE.   2/28 LE venous duplex w/ RLE DVT  Today, 12/21/2015:  HL 0.20, sub- therapeutic on Heparin at 900 units/hr, previous levels within range, no problems noted with infusion overnight  CBC:  Hgb decreasing (12.3 > 11.7 > 10 > 9.3 > 8.9), but Plt remain WNL  No bleeding or complications reported by RN or patient.  Diet: heart healthy  Goal of Therapy:  Heparin level 0.3-0.7 units/ml Monitor platelets by anticoagulation protocol:  Yes   Plan:   Increase Heparin infusion to 1100 units/hr (11 ml/hr)  Daily heparin level and CBC  Continue to monitor for s/s bleeding or thrombosis.  Follow up long-term anticoagulation plan.  Otho BellowsGreen, Nikolos Billig L PharmD Pager [phone] 12/21/2015, 7:03 AM  0830: Begin Apixaban for VTE treatment,discontinue Heparin  Apixaban 10mg  bid x 7 days, then reduce dose to 5mg  bid Discontinue Heparin at time of Apixaban administration  Continue daily CBC Apixaban education  Otho BellowsGreen, Spenser Cong L PharmD Pager [phone] 12/21/2015, 8:36 AM

## 2015-12-21 NOTE — Care Management Important Message (Signed)
Important Message  Patient Details  Name: Cheryll DessertColleen M Shams MRN: [id number] Date of Birth: 02-Jul-1924   Medicare Important Message Given:  Yes    Haskell FlirtJamison, Lilya Smitherman 12/21/2015, 2:34 PMImportant Message  Patient Details  Name: Cheryll DessertColleen M Sulewski MRN: [id number] Date of Birth: 02-Jul-1924   Medicare Important Message Given:  Yes    Haskell FlirtJamison, Cayenne Breault 12/21/2015, 2:33 PM

## 2015-12-21 NOTE — Discharge Summary (Signed)
Physician Discharge Summary  [name]:[id number] DOB: October 09, 1924 DOA: 12/17/2015  PCP: Allean Found, MD  Admit date: 12/17/2015 Discharge date: 12/21/2015  Time spent: 40 minutes  Recommendations for Outpatient Follow-up:  1. To up with the nursing home M.D. 2. Eliquis 10 mg twice a day for only 7 days, then switch to 5 mg twice a day   Discharge Diagnoses:  Principal Problem:   Pulmonary embolism (HCC) Active Problems:   Aortic stenosis, severe   Essential hypertension   SAH (subarachnoid hemorrhage) (HCC)   Diastolic dysfunction-grade 2   Fracture of humerus, left, closed   Humerus fracture   Discharge Condition: Stable  Diet recommendation: Heart healthy  Filed Weights   12/18/15 0537  Weight: 74.2 kg (163 lb 9.3 oz)    History of present illness: According to the admitting M.D. [name] is a 82 y.o. female   has a past medical history of Aortic valve disorders; Allergic rhinitis; Arthritis; Dyslipidemia; Obesity; Glaucoma; Hypercholesteremia; Vertigo; and Hypertension.   Presented with shortness of breath and then developed a arm pain after EMS seen her. Patient sustained a fall in and of January and presented on February 1 to March department where she was diagnosed with subarachnoid hemorrhage as well as Colles' fracture of right wrist she was discharged to nursing home [name] facility from ER. Patient was discharged back at home 3 days ago. She has been complaining of shortness of breath or leg swelling in mainly trouble swallowing. Family thinks she may have had a panic atacck. Usually patietn has good memory and recall but she is unsure about any significant shortness of breath today. Patient required opted to assist to use the restroom. While EMS arrived to evaluate her for shortness of breath and felt a pop in the left shoulder as patient was standing and she started to complain about left shoulder pain. Patient was taken to emerge  department. At baseline patient at times a pleasantly confused. Per family she was eating breakfast well and had no choking episodes. Patient denies any cough fevers or chest pain but history is unreliable. Family stated the lower extremity has been swollen ever since rehabilitation stay  IN ER: Chest x-ray show no acute disease that was noted to have acute fracture proximal left humerus Which was confirmed by left shoulder film. Calcium 8.8 troponin within normal limits white blood cell count 9.6 and she afebrile heart rate 77 satting 99% RA   Hospital Course:   Bilateral acute pulmonary emboli Heart strain considered by CT findings however no hemodynamic instability and no hypoxia.  Troponins negative. No acute changes in her EKG. 2-D echo showed no evidence of significant heart strain. Patient was on continuous heparin drip, switched to Eliquis. Watch for any signs of bleeding.  Right lower extremity DVT Continue Eliquis for VTE treatment  Left humerus fracture.  Evaluated by orthopedics, recommended conservative nonoperative management with splint.  Normocytic anemia Suspect dilution. No evidence of obvious bleeding.  Small traumatic subarachnoid hemorrhage, Colles' fracture right wrist status post fall 10/2015.  Asymptomatic. Repeat CT scan on admission showed no evidence of bleeding.  Admitting physician discussed the case with neurosurgery felt it was safe to anticoagulate.  Severe aortic stenosis Seen by cardiology in past, felt to be candidate for TAVR, referred to primary cardiologist for timing of procedure.   Procedures:  None  Consultations:  None  Discharge Exam: Filed Vitals:   12/21/15 0929 12/21/15 1457  BP: 140/54 145/56  Pulse: 92 100  Temp:  98.2 F (36.8 C)  Resp:     General: Alert and awake HEENT: anicteric sclera, pupils reactive to light and accommodation, EOMI CVS: S1-S2 clear, no murmur rubs or gallops Chest: clear to auscultation  bilaterally, no wheezing, rales or rhonchi Abdomen: soft nontender, nondistended, normal bowel sounds, no organomegaly Extremities: no cyanosis, clubbing or edema noted bilaterally Neuro: Cranial nerves II-XII intact, no focal neurological deficits   Discharge Instructions    Current Discharge Medication List    START taking these medications   Details  apixaban (ELIQUIS) 5 MG TABS tablet Take 10 mg twice a day for only 7 days then 5 mg twice a day      CONTINUE these medications which have CHANGED   Details  oxyCODONE-acetaminophen (PERCOCET/ROXICET) 5-325 MG tablet Take 1 tablet by mouth every 3 (three) hours as needed for moderate pain or severe pain. Qty: 10 tablet, Refills: 0      CONTINUE these medications which have NOT CHANGED   Details  acetaminophen (TYLENOL) 500 MG tablet Take 1 tablet (500 mg total) by mouth every 6 (six) hours as needed for moderate pain or headache. Qty: 30 tablet, Refills: 0    aspirin EC 81 MG tablet Take 81 mg by mouth daily.    ezetimibe-simvastatin (VYTORIN) 10-20 MG per tablet Take 1 tablet by mouth daily.    metoprolol succinate (TOPROL-XL) 25 MG 24 hr tablet Take 1 tablet (25 mg total) by mouth daily. Qty: 30 tablet, Refills: 6    Multiple Vitamin (MULTIVITAMIN WITH MINERALS) TABS tablet Take 1 tablet by mouth daily.    polyethylene glycol (MIRALAX / GLYCOLAX) packet Take 17 g by mouth daily.    senna-docusate (SENOKOT-S) 8.6-50 MG tablet Take 1 tablet by mouth 2 (two) times daily. Qty: 30 tablet, Refills: 0    TRAVATAN Z 0.004 % SOLN ophthalmic solution Place 1 drop into both eyes at bedtime.        No Known Allergies Follow-up Information    Follow up with Allean Found, MD In 1 week.   Specialty:  Family Medicine   Contact information:   [phone] [place] [address] [phone]        The results of significant diagnostics from this hospitalization (including imaging, microbiology,  ancillary and laboratory) are listed below for reference.    Significant Diagnostic Studies: Dg Chest 1 View  12/17/2015  CLINICAL DATA:  Acute left humerus fracture. The patient fell inside her house today. EXAM: CHEST 1 VIEW COMPARISON:  11/21/2015 FINDINGS: The heart size and pulmonary vascularity are normal and the lungs are clear. Acute slightly displaced fracture of the proximal left humeral shaft is noted. Severe arthritic changes of both shoulders are stable. IMPRESSION: No acute disease in the chest. Acute fracture of the proximal left humerus. Electronically Signed   By: Francene Boyers M.D.   On: 12/17/2015 15:25   Ct Head Wo Contrast  12/17/2015  CLINICAL DATA:  82 year old female with recent fall lab presenting with shortness of breath and bilateral lower extremity swelling. Clinical concern for acute encephalopathy. EXAM: CT HEAD WITHOUT CONTRAST TECHNIQUE: Contiguous axial images were obtained from the base of the skull through the vertex without intravenous contrast. COMPARISON:  CT dated 11/21/2015 FINDINGS: There is mild prominence of the ventricles and sulci compatible with age-related atrophy. Moderate periventricular and deep white matter chronic microvascular ischemic changes noted. There is no acute intracranial hemorrhage. No mass effect or midline shift. The visualized paranasal sinuses and mastoid  air cells are clear. The calvarium is intact. IMPRESSION: No acute intracranial hemorrhage. Age-related atrophy and chronic microvascular ischemic disease. If symptoms persist and there are no contraindications, MRI may provide better evaluation if clinically indicated. Electronically Signed   By: Elgie Collard M.D.   On: 12/17/2015 21:13   Ct Angio Chest Pe W/cm &/or Wo Cm  12/17/2015  ADDENDUM REPORT: 12/17/2015 23:55 ADDENDUM: Acute findings discussed with and reconfirmed by Dr.MARCY PFEIFFER on 12/17/2015 at 11:55 pm. Electronically Signed   By: Awilda Metro M.D.   On:  12/17/2015 23:55  12/17/2015  CLINICAL DATA:  Recent fall, now with shortness of breath and bilateral leg swelling. Acute LEFT humerus fracture. History of hypertension, hyperlipidemia. EXAM: CT ANGIOGRAPHY CHEST WITH CONTRAST TECHNIQUE: Multidetector CT imaging of the chest was performed using the standard protocol during bolus administration of intravenous contrast. Multiplanar CT image reconstructions and MIPs were obtained to evaluate the vascular anatomy. CONTRAST:  OMNIPAQUE IOHEXOL 350 MG/ML SOLN COMPARISON:  Chest radiograph December 17, 2015 at 1511 hours FINDINGS: PULMONARY ARTERY: Adequate contrast opacification of the pulmonary artery's. Main pulmonary artery is not enlarged. Mild expansile filling defect within the lingula lobar pulmonary artery casting to the segmental and subsegmental branches with occlusion. Acute occlusive LEFT lower lobe segmental to subsegmental pulmonary embolus. Strandy filling defects RIGHT upper lobe segmental, RIGHT middle lobe segmental arteries. RIGHT lower lobe segmental to subsegmental acute pulmonary embolus. MEDIASTINUM: Heart size is mildly enlarged. Suspected RIGHT heart strain objectively though, limited assessment due to cardiac motion. Moderate coronary artery calcifications. No pericardial effusions. Thoracic aorta is normal course and caliber, mild calcific atherosclerosis. No lymphadenopathy by CT size criteria. LUNGS: Tracheobronchial tree is patent, no pneumothorax. No pleural effusions, focal consolidations, pulmonary nodules or masses. LEFT lower lobe small calcified granuloma. SOFT TISSUES AND OSSEOUS STRUCTURES: Included view of the abdomen is nonacute; small hiatal hernia. Acute proximal LEFT humerus fracture. Severe degenerative changes of bilateral shoulders, with screw backout of RIGHT glenoid fossa. Old bilateral anterior with subacute to chronic rib fractures. Osteopenia. Review of the MIP images confirms the above findings. IMPRESSION:  Multiple acute bilateral occlusive pulmonary emboli, predominately segmental the subsegmental. Suspected RIGHT heart strain though limited by cardiac motion. Acute proximal LEFT humerus fracture. Electronically Signed: By: Awilda Metro M.D. On: 12/17/2015 23:51   Dg Shoulder Left  12/17/2015  CLINICAL DATA:  Fall today inside her house getting in a chair in her living room. Left arm pain, no chest complaints, EXAM: LEFT SHOULDER - 2+ VIEW COMPARISON:  Shoulder radiograph 11/21/2015 FINDINGS: Oblique fracture through the proximal diaphysis LEFT humerus. Impaction type fracture of the surgical neck of the humerus. Severe arthropathy of the joint with erosion of the humeral head. IMPRESSION: 1. Acute Oblique fracture through the proximal humeral diaphysis. 2. Acute Impaction fracture of the surgical neck of the humerus. 3. Chronic arthropathy of the humeral head with severe joint space loss and remodeling. Electronically Signed   By: Genevive Bi M.D.   On: 12/17/2015 15:23    Microbiology: Recent Results (from the past 240 hour(s))  MRSA PCR Screening     Status: None   Collection Time: 12/18/15  6:21 AM  Result Value Ref Range Status   MRSA by PCR NEGATIVE NEGATIVE Final    Comment:        The GeneXpert MRSA Assay (FDA approved for NASAL specimens only), is one component of a comprehensive MRSA colonization surveillance program. It is not intended to diagnose MRSA infection nor to guide  or monitor treatment for MRSA infections.      Labs: Basic Metabolic Panel:  Recent Labs Lab 12/17/15 1609 12/18/15 0602  NA 139 139  K 3.8 3.8  CL 106 107  CO2 24 25  GLUCOSE 91 112*  BUN 16 10  CREATININE 0.49 0.46  CALCIUM 8.8* 8.5*  MG  --  1.6*  PHOS  --  2.8   Liver Function Tests:  Recent Labs Lab 12/18/15 0602  AST 25  ALT 12*  ALKPHOS 102  BILITOT 0.9  PROT 5.7*  ALBUMIN 3.1*   No results for input(s): LIPASE, AMYLASE in the last 168 hours. No results for  input(s): AMMONIA in the last 168 hours. CBC:  Recent Labs Lab 12/17/15 1609 12/18/15 0602 12/19/15 0429 12/20/15 0457 12/21/15 0502  WBC 9.6 9.7 11.1* 11.3* 11.5*  NEUTROABS 5.9  --   --   --   --   HGB 12.3 11.7* 10.0* 9.3* 8.9*  HCT 38.8 36.7 31.8* 28.6* 27.9*  MCV 90.7 91.1 89.8 89.4 90.9  PLT 223 238 229 230 254   Cardiac Enzymes:  Recent Labs Lab 12/17/15 1609 12/18/15 0037 12/18/15 0602 12/18/15 1144  TROPONINI <0.03 <0.03 <0.03 <0.03   BNP: BNP (last 3 results)  Recent Labs  12/17/15 1609  BNP 86.1    ProBNP (last 3 results) No results for input(s): PROBNP in the last 8760 hours.  CBG: No results for input(s): GLUCAP in the last 168 hours.     Signed:  Clint LippsELMAHI,Yuriko Portales A MD.  [facility] 12/21/2015, 4:01 PM

## 2015-12-21 NOTE — Discharge Instructions (Signed)
Information on my medicine - ELIQUIS (apixaban)  This medication education was reviewed with me or my healthcare representative as part of my discharge preparation.  The pharmacist that spoke with me during my hospital stay was:  Offie Pickron L, [facility] CeOtho BellowsnterRPH  Why was Eliquis prescribed for you? Eliquis was prescribed to treat blood clots that may have been found in the veins of your legs (deep vein thrombosis) or in your lungs (pulmonary embolism) and to reduce the risk of them occurring again.  What do You need to know about Eliquis ? The starting dose is 10 mg (two 5 mg tablets) taken TWICE daily for the FIRST SEVEN (7) DAYS, then on (enter date) 3/10  the dose is reduced to ONE 5 mg tablet taken TWICE daily.  Eliquis may be taken with or without food.   Try to take the dose about the same time in the morning and in the evening. If you have difficulty swallowing the tablet whole please discuss with your pharmacist how to take the medication safely.  Take Eliquis exactly as prescribed and DO NOT stop taking Eliquis without talking to the doctor who prescribed the medication.  Stopping may increase your risk of developing a new blood clot.  Refill your prescription before you run out.  After discharge, you should have regular check-up appointments with your healthcare provider that is prescribing your Eliquis.    What do you do if you miss a dose? If a dose of ELIQUIS is not taken at the scheduled time, take it as soon as possible on the same day and twice-daily administration should be resumed. The dose should not be doubled to make up for a missed dose.  Important Safety Information A possible side effect of Eliquis is bleeding. You should call your healthcare provider right away if you experience any of the following: ? Bleeding from an injury or your nose that does not stop. ? Unusual colored urine (red or dark brown) or unusual colored stools (red or black). ? Unusual bruising for  unknown reasons. ? A serious fall or if you hit your head (even if there is no bleeding).  Some medicines may interact with Eliquis and might increase your risk of bleeding or clotting while on Eliquis. To help avoid this, consult your healthcare provider or pharmacist prior to using any new prescription or non-prescription medications, including herbals, vitamins, non-steroidal anti-inflammatory drugs (NSAIDs) and supplements.  On Aspirin 81mg  daily prior to admission, check with MD if to continue while on Apixiban  This website has more information on Eliquis (apixaban): http://www.eliquis.com/eliquis/home

## 2015-12-22 ENCOUNTER — Inpatient Hospital Stay (HOSPITAL_COMMUNITY): Payer: Medicare Other

## 2015-12-22 LAB — CBC
HCT: 23.9 % — ABNORMAL LOW (ref 36.0–46.0)
HCT: 29.7 % — ABNORMAL LOW (ref 36.0–46.0)
HEMOGLOBIN: 9.4 g/dL — AB (ref 12.0–15.0)
Hemoglobin: 7.5 g/dL — ABNORMAL LOW (ref 12.0–15.0)
MCH: 28.8 pg (ref 26.0–34.0)
MCH: 28.7 pg (ref 26.0–34.0)
MCHC: 31.4 g/dL (ref 30.0–36.0)
MCHC: 31.6 g/dL (ref 30.0–36.0)
MCV: 91.9 fL (ref 78.0–100.0)
MCV: 90.8 fL (ref 78.0–100.0)
PLATELETS: 285 10*3/uL (ref 150–400)
PLATELETS: 159 10*3/uL (ref 150–400)
RBC: 2.6 MIL/uL — ABNORMAL LOW (ref 3.87–5.11)
RBC: 3.27 MIL/uL — AB (ref 3.87–5.11)
RDW: 16 % — AB (ref 11.5–15.5)
RDW: 15.9 % — ABNORMAL HIGH (ref 11.5–15.5)
WBC: 12.3 10*3/uL — AB (ref 4.0–10.5)
WBC: 13.8 10*3/uL — ABNORMAL HIGH (ref 4.0–10.5)

## 2015-12-22 MED ORDER — GI COCKTAIL ~~LOC~~
30.0000 mL | Freq: Once | ORAL | Status: AC
  Administered 2015-12-22: 30 mL via ORAL
  Filled 2015-12-22: qty 30

## 2015-12-22 NOTE — Progress Notes (Signed)
PROGRESS NOTE  Karen DessertColleen M Samson FAO:[id number] DOB: [birth year] DOA: 12/17/2015 PCP: Allean FoundSMITH,CANDACE THIELE, MD   HPI/Subjective: He while she was getting the 2-D echo, no significant right heart strain. Mild pulmonary artery hypertension of 35 mmHg. Had prolonged discussion with daughters [name] and [name], there were concerns about discharge. but I answered all their questions.  Summary: 82 year old woman recently discharged from [name] and I'll see with home health PMH severe aortic stenosis, recent hospitalization for a fall resulting in subarachnoid hemorrhage and right wrist fracture; presented with shortness of breath and acute left humerus fracture great found to have multiple subsegmental and segmental pulmonary emboli. Case was discussed with neurosurgery given recent subarachnoid bleed and patient was cleared to begin heparin infusion.  Assessment/Plan:  Bilateral acute pulmonary emboli Heart strain considered by CT findings however no hemodynamic instability and no hypoxia.  Troponins negative. No acute changes in her EKG. 2-D echo showed no evidence of significant heart strain. Was on heparin drip, currently on Eliquis.  Left humeral fracture with swelling Fracture of the cervical neck of the left humerus, seen by orthopedics on admission recommended nonoperative management. Patient has splint, developed swelling on the morning of 12/22/15. Seen by Dr. been again today, x-ray repeated showed minimal displacement. No evidence of compartment syndrome, some bruising but clinically doesn't look hematoma. Continue sling/immobilizer, elevate limb  Normocytic anemia  Hemoglobin 10 on the 3/1, hemoglobin this morning was 7.5, likely error, repeat lab is 9.4 without transfusion.  Right lower extremity DVT Continue Eliquis for VTE treatment  Small traumatic subarachnoid hemorrhage, Colles' fracture right wrist status post fall 10/2015.  Asymptomatic. Repeat CT scan on admission  showed no evidence of bleeding.  Admitting physician discussed the case with neurosurgery felt it was safe to anticoagulate.  Severe aortic stenosis Seen by cardiology in past, felt to be candidate for TAVR, referred to primary cardiologist for timing of procedure.    Code Status: DNR/DNI DVT prophylaxis: On Eliquis Family Communication: Postpone discharge today, continue neurovascular checks for the left upper extremity.  Disposition Plan: home when improved  Clydia LlanoMutaz Abagale Boulos, MD [facility]  Pager [phone] If 7PM-7AM, please contact night-coverage at [email or web address], password [email or web address] 12/22/2015, 11:35 AM  LOS: 4 days   Consultants:  Orthopedics   ST: regular diet  Procedures:    Antibiotics:     Objective: Filed Vitals:   12/21/15 0929 12/21/15 1457 12/21/15 2116 12/22/15 0640  BP: 140/54 145/56 123/51 126/85  Pulse: 92 100 63 103  Temp:  98.2 F (36.8 C) 98.7 F (37.1 C) 98.7 F (37.1 C)  TempSrc:  Oral Oral Oral  Resp:   22 20  Height:      Weight:      SpO2:  97% 96% 97%    Intake/Output Summary (Last 24 hours) at 12/22/15 1135 Last data filed at 12/22/15 0843  Gross per 24 hour  Intake    410 ml  Output      0 ml  Net    410 ml     Filed Weights   12/18/15 0537  Weight: 74.2 kg (163 lb 9.3 oz)    Exam:    General:  Appears calm and comfortable Cardiovascular: RRR, no m/r/g. No LE edema. Telemetry: SR, no arrhythmias  Respiratory: CTA bilaterally, no w/r/r. Normal respiratory effort. Psychiatric: slightly confused New data reviewed:  Hemoglobin down to 10.0  Scheduled Meds: . apixaban  10 mg Oral BID  . [START ON 12/28/2015] apixaban  5 mg Oral BID  . ezetimibe-simvastatin  1 tablet Oral q1800  . guaiFENesin  600 mg Oral BID  . latanoprost  1 drop Both Eyes QHS  . metoprolol succinate  25 mg Oral Daily  . polyethylene glycol  17 g Oral Daily  . senna-docusate  1 tablet Oral BID  . sodium chloride flush  3 mL Intravenous Q12H    Continuous Infusions:    Principal Problem:   Pulmonary embolism (HCC) Active Problems:   Aortic stenosis, severe   Essential hypertension   SAH (subarachnoid hemorrhage) (HCC)   Diastolic dysfunction-grade 2   Fracture of humerus, left, closed   Humerus fracture

## 2015-12-22 NOTE — Progress Notes (Signed)
0745 Left arm with 4+ pitting edema. Fingers and hand cold to touch. Pt states able to feel slight touch. Weak grip. Able to move fingers. Capillary refill <3 sec. MD notified. Orders to elevate and apply ice received. Will continue to monitor.  Melton Alarana A Maicol Bowland, RN

## 2015-12-22 NOTE — Progress Notes (Signed)
Patient ID: Karen DessertColleen M Kelley, female   DOB: 1924/02/06, 82 y.o.   MRN: [id number]  Xray shows minimal displacement of fracture. A slight change. Continue NV checks and immobilizer.

## 2015-12-22 NOTE — Progress Notes (Signed)
ANTICOAGULATION CONSULT NOTE - Follow Up Consult  Pharmacy Consult for Apixiban Indication: pulmonary embolus and DVT  No Known Allergies  Patient Measurements: Height: 5\' 3"  (160 cm) Weight: 163 lb 9.3 oz (74.2 kg) IBW/kg (Calculated) : 52.4 Heparin Dosing Weight: 68 kg  Vital Signs: Temp: 98.7 F (37.1 C) (03/04 0640) Temp Source: Oral (03/04 0640) BP: 126/85 mmHg (03/04 0640) Pulse Rate: 103 (03/04 0640)  Labs:  Recent Labs  12/20/15 0457 12/21/15 0502 12/22/15 0608 12/22/15 1038  HGB 9.3* 8.9* 7.5* 9.4*  HCT 28.6* 27.9* 23.9* 29.7*  PLT 230 254 285 159  HEPARINUNFRC 0.47 0.20*  --   --    Estimated Creatinine Clearance: 46 mL/min (by C-G formula based on Cr of 0.46).    Assessment: [age] yoF presented to ED on 2/27 with c/o arm pain, SOB, bilateral leg swelling.  PMH significant for HTN, dCHF, severe aortic stenosis, and recent fall resulting in right wrist fracture, and small subarachnoid hemorrhage discharged to rehab at [name] and had transferred home about 3 days ago.  She sustained a left humerus fracture during this transport to ED.  Neurosurgery discussed history of subarachnoid bleed and and agrees with starting anticoagulation since repeat CT head today shows resolution of bleeding.  Pharmacy is consulted to dose Heparin IV.  2/27 CT w/ bilateral occlusive PE.   2/28 LE venous duplex w/ RLE DVT 3/3 Discontinue Heparin, begin Apixaban at 1000, Heparin infusion to be discontinued at same time.   Today, 12/22/2015:  CBC:  Hgb decreased (7.5 gm/dl), concern for bleed in LUE  Noted that Apixiban given yesterday 0934, Heparin discontinued 1146  Follow up Hgb this am is 9.4  Goal of Therapy:  Monitor platelets by anticoagulation protocol: Yes   Plan:   Continue Apixaban 10mg  bid x 7 days, then reduce dose to 5mg  bid for treatment of PE, DVT  Continued daily CBC  Continue to monitor for s/s bleeding or thrombosis.  Otho BellowsGreen, Skyrah Krupp L PharmD Pager  [phone] 12/22/2015, 12:25 PM

## 2015-12-22 NOTE — Progress Notes (Signed)
Subjective:  Swelling. Denies pain or numbness   Objective: Vital signs in last 24 hours: Temp:  [98.2 F (36.8 C)-98.7 F (37.1 C)] 98.7 F (37.1 C) (03/04 0640) Pulse Rate:  [63-103] 103 (03/04 0640) Resp:  [20-22] 20 (03/04 0640) BP: (123-145)/(51-85) 126/85 mmHg (03/04 0640) SpO2:  [96 %-97 %] 97 % (03/04 0640)  Intake/Output from previous day: 03/03 0701 - 03/04 0700 In: 410 [P.O.:360; I.V.:50] Out: -  Intake/Output this shift: Total I/O In: 120 [P.O.:120] Out: -    Recent Labs  12/20/15 0457 12/21/15 0502 12/22/15 0608  HGB 9.3* 8.9* 7.5*    Recent Labs  12/21/15 0502 12/22/15 0608  WBC 11.5* 12.3*  RBC 3.07* 2.60*  HCT 27.9* 23.9*  PLT 254 285   No results for input(s): NA, K, CL, CO2, BUN, CREATININE, GLUCOSE, CALCIUM in the last 72 hours. No results for input(s): LABPT, INR in the last 72 hours.  Neurologically intact Moderate swelling increased since last exam. Immobilizer/sling not in place. Ace bandage down around elbow constricting arm. Compartments forearm soft. Moderate in arm. Doppler pulses radial and ulnar. Significant swelling top of hand. Arm rewrapped. Sling adjusted.  Assessment/Plan: Proximal humerus fracture with probable recurrent bleed Due to overlap of anticoagulation, immobilizer undone and ace sliding. Discussed with nursing and MD. Stay in bed today. Continue NV checks. Will discuss with pharmacy. Keep arm immoble. Sling/ immobilizer to be checked as on prior to  OOB when that resumes. Patient has no pain so no sign of compartment syndrome. Will re xray Transfusion? Bed rest for now. Close check on NV status.   Sky Primo C 12/22/2015, 10:23 AM   [phone]

## 2015-12-23 LAB — CBC
HCT: 26.8 % — ABNORMAL LOW (ref 36.0–46.0)
Hemoglobin: 8.4 g/dL — ABNORMAL LOW (ref 12.0–15.0)
MCH: 28.3 pg (ref 26.0–34.0)
MCHC: 31.3 g/dL (ref 30.0–36.0)
MCV: 90.2 fL (ref 78.0–100.0)
PLATELETS: 306 10*3/uL (ref 150–400)
RBC: 2.97 MIL/uL — AB (ref 3.87–5.11)
RDW: 15.7 % — ABNORMAL HIGH (ref 11.5–15.5)
WBC: 10.1 10*3/uL (ref 4.0–10.5)

## 2015-12-23 LAB — BASIC METABOLIC PANEL
ANION GAP: 7 (ref 5–15)
BUN: 13 mg/dL (ref 6–20)
CALCIUM: 8.5 mg/dL — AB (ref 8.9–10.3)
CO2: 25 mmol/L (ref 22–32)
Chloride: 105 mmol/L (ref 101–111)
Creatinine, Ser: 0.51 mg/dL (ref 0.44–1.00)
GFR calc Af Amer: >60 mL/min (ref >60)
Glucose, Bld: 107 mg/dL — ABNORMAL HIGH (ref 65–99)
Potassium: 4.2 mmol/L (ref 3.5–5.1)
SODIUM: 137 mmol/L (ref 135–145)

## 2015-12-23 MED ORDER — ALPRAZOLAM 0.25 MG PO TABS
0.2500 mg | ORAL_TABLET | Freq: Once | ORAL | Status: AC
  Administered 2015-12-23: 0.25 mg via ORAL
  Filled 2015-12-23: qty 1

## 2015-12-23 NOTE — Progress Notes (Signed)
Subjective:     Patient reports pain as mild to left arm. Denies numbness or tingling. Denies SOB, CP, or loss of left arm function.    Objective: Vital signs in last 24 hours: Temp:  [98.1 F (36.7 C)-98.2 F (36.8 C)] 98.1 F (36.7 C) (03/05 0645) Pulse Rate:  [65-87] 65 (03/05 0645) Resp:  [21-26] 21 (03/05 0645) BP: (104-125)/(43-68) 125/60 mmHg (03/05 0645) SpO2:  [98 %-100 %] 98 % (03/05 0645)  Intake/Output from previous day: 03/04 0701 - 03/05 0700 In: 360 [P.O.:360] Out: -  Intake/Output this shift:     Recent Labs  12/21/15 0502 12/22/15 0608 12/22/15 1038 12/23/15 0543  HGB 8.9* 7.5* 9.4* 8.4*    Recent Labs  12/22/15 1038 12/23/15 0543  WBC 13.8* 10.1  RBC 3.27* 2.97*  HCT 29.7* 26.8*  PLT 159 306    Recent Labs  12/23/15 0543  NA 137  K 4.2  CL 105  CO2 25  BUN 13  CREATININE 0.51  GLUCOSE 107*  CALCIUM 8.5*   No results for input(s): LABPT, INR in the last 72 hours.  Alert and oriented x3. RRR, Lungs clear, BS x4. Left arm soft and non tender. L dressing C/D/I. No DVT signs. No signs of infection or compartment syndrome. LUE grossly neurovascularly intact. No sling in place, arm is positioned well.   Assessment/Plan: Left humerus fracture: Another sling is being ordered as the previous one was soiled. Continue other care Monitor N/V status  Ekansh Sherk L 12/23/2015, 11:40 AM

## 2015-12-23 NOTE — Progress Notes (Signed)
PROGRESS NOTE  [name]:[id number] DOB: [birth year] DOA: 12/17/2015 PCP: Allean Found, MD   HPI/Subjective: He while she was getting the 2-D echo, no significant right heart strain. Mild pulmonary artery hypertension of 35 mmHg. Had prolonged discussion with daughters Olegario Messier and [name], there were concerns about discharge. but I answered all their questions.  Summary: 82 year old woman recently discharged from [name] and I'll see with home health PMH severe aortic stenosis, recent hospitalization for a fall resulting in subarachnoid hemorrhage and right wrist fracture; presented with shortness of breath and acute left humerus fracture great found to have multiple subsegmental and segmental pulmonary emboli. Case was discussed with neurosurgery given recent subarachnoid bleed and patient was cleared to begin heparin infusion.  Assessment/Plan:  Bilateral acute pulmonary emboli Heart strain considered by CT findings however no hemodynamic instability and no hypoxia.  Troponins negative. No acute changes in her EKG. 2-D echo showed no evidence of significant heart strain. Was on heparin drip, currently on Eliquis.  Left humeral fracture with swelling Fracture of the surgical neck of the left humerus, seen by orthopedics on admission recommended nonoperative management. Patient has splint, developed swelling on the morning of 12/22/15. Seen by Dr. Jillyn Hidden again today, x-ray repeated showed minimal displacement. No evidence of compartment syndrome. Continue sling/immobilizer, elevate limb  Normocytic anemia  Hemoglobin 10 on the 3/1, hemoglobin this morning was 7.5, likely error, repeat lab is 9.4 without transfusion. Hemoglobin is 8.4 today.  Right lower extremity DVT Continue Eliquis for VTE treatment  Small traumatic subarachnoid hemorrhage, Colles' fracture right wrist status post fall 10/2015.  Asymptomatic. Repeat CT scan on admission showed no evidence of  bleeding.  Admitting physician discussed the case with neurosurgery felt it was safe to anticoagulate.  Severe aortic stenosis Seen by cardiology in past, felt to be candidate for TAVR, referred to primary cardiologist for timing of procedure.    Code Status: DNR/DNI DVT prophylaxis: On Eliquis Family Communication: Discharge to SNF in a.m.  Disposition Plan: home when improved  Clydia Llano, MD [facility]  Pager [phone] If 7PM-7AM, please contact night-coverage at [email or web address], password [facility] 12/23/2015, 11:49 AM  LOS: 5 days   Consultants:  Orthopedics   ST: regular diet  Procedures:    Antibiotics:     Objective: Filed Vitals:   12/22/15 1349 12/22/15 1610 12/22/15 2223 12/23/15 0645  BP: 110/68  125/65 125/60  Pulse:   87 65  Temp:   98.2 F (36.8 C) 98.1 F (36.7 C)  TempSrc:   Oral Oral  Resp:   22 21  Height:      Weight:      SpO2:  98% 98% 98%    Intake/Output Summary (Last 24 hours) at 12/23/15 1149 Last data filed at 12/22/15 1823  Gross per 24 hour  Intake    240 ml  Output      0 ml  Net    240 ml     Filed Weights   12/18/15 0537  Weight: 74.2 kg (163 lb 9.3 oz)    Exam:    General:  Appears calm and comfortable Cardiovascular: RRR, no m/r/g. No LE edema. Telemetry: SR, no arrhythmias  Respiratory: CTA bilaterally, no w/r/r. Normal respiratory effort. Psychiatric: slightly confused New data reviewed:  Hemoglobin down to 10.0  Scheduled Meds: . apixaban  10 mg Oral BID  . [START ON 12/28/2015] apixaban  5 mg Oral BID  . ezetimibe-simvastatin  1 tablet Oral q1800  . guaiFENesin  600 mg Oral  BID  . latanoprost  1 drop Both Eyes QHS  . metoprolol succinate  25 mg Oral Daily  . polyethylene glycol  17 g Oral Daily  . senna-docusate  1 tablet Oral BID  . sodium chloride flush  3 mL Intravenous Q12H   Continuous Infusions:    Principal Problem:   Pulmonary embolism (HCC) Active Problems:   Aortic stenosis,  severe   Essential hypertension   SAH (subarachnoid hemorrhage) (HCC)   Diastolic dysfunction-grade 2   Fracture of humerus, left, closed   Humerus fracture

## 2015-12-24 LAB — BASIC METABOLIC PANEL
Anion gap: 6 (ref 5–15)
BUN: 15 mg/dL (ref 4–21)
BUN: 15 mg/dL (ref 6–20)
CALCIUM: 8.3 mg/dL — AB (ref 8.9–10.3)
CO2: 26 mmol/L (ref 22–32)
Chloride: 103 mmol/L (ref 101–111)
Creatinine, Ser: 0.45 mg/dL (ref 0.44–1.00)
Creatinine: 0.4 mg/dL — AB (ref 0.5–1.1)
GFR calc Af Amer: >60 mL/min (ref >60)
GLUCOSE: 97 mg/dL (ref 65–99)
Glucose: 97 mg/dL
Potassium: 4.4 mmol/L (ref 3.5–5.1)
SODIUM: 135 mmol/L (ref 135–145)
Sodium: 135 mmol/L — AB (ref 137–147)

## 2015-12-24 LAB — CBC
HCT: 26.1 % — ABNORMAL LOW (ref 36.0–46.0)
Hemoglobin: 8.1 g/dL — ABNORMAL LOW (ref 12.0–15.0)
MCH: 28.8 pg (ref 26.0–34.0)
MCHC: 31 g/dL (ref 30.0–36.0)
MCV: 92.9 fL (ref 78.0–100.0)
PLATELETS: 330 10*3/uL (ref 150–400)
RBC: 2.81 MIL/uL — AB (ref 3.87–5.11)
RDW: 15.8 % — AB (ref 11.5–15.5)
WBC: 9.5 10*3/uL (ref 4.0–10.5)

## 2015-12-24 LAB — FACTOR 5 LEIDEN

## 2015-12-24 LAB — PROTHROMBIN GENE MUTATION

## 2015-12-24 MED ORDER — OXYCODONE-ACETAMINOPHEN 5-325 MG PO TABS: 1.0000 | ORAL_TABLET | ORAL | Status: DC | PRN

## 2015-12-24 NOTE — Progress Notes (Signed)
Subjective: L proximal humeral shaft/head fracture In new sling L arm Denies pain  Objective: Vital signs in last 24 hours: Temp:  [98.1 F (36.7 C)-98.2 F (36.8 C)] 98.2 F (36.8 C) (03/06 0545) Pulse Rate:  [86-88] 86 (03/06 0545) Resp:  [20] 20 (03/06 0545) BP: (100-142)/(41-46) 142/41 mmHg (03/06 0545) SpO2:  [100 %] 100 % (03/06 0545)  Intake/Output from previous day: 03/05 0701 - 03/06 0700 In: 480 [P.O.:480] Out: -  Intake/Output this shift: Total I/O In: 140 [P.O.:140] Out: -    Recent Labs  12/22/15 0608 12/22/15 1038 12/23/15 0543 12/24/15 0457  HGB 7.5* 9.4* 8.4* 8.1*    Recent Labs  12/23/15 0543 12/24/15 0457  WBC 10.1 9.5  RBC 2.97* 2.81*  HCT 26.8* 26.1*  PLT 306 330    Recent Labs  12/23/15 0543 12/24/15 0457  NA 137 135  K 4.2 4.4  CL 105 103  CO2 25 26  BUN 13 15  CREATININE 0.51 0.45  GLUCOSE 107* 97  CALCIUM 8.5* 8.3*   No results for input(s): LABPT, INR in the last 72 hours.  Neurologically intact ABD soft Neurovascular intact Sensation intact distally Intact pulses distally Dorsiflexion/Plantar flexion intact No cellulitis present Compartment soft swelling noted L hand- moderate, resolving ecchymosis  ACE wrap bunched at L wrist  Assessment/Plan: Continue sling L arm NWB L arm ACE wrap removed to avoid tourniquet effect Discussed with Dr. Shelle IronBeane Will consult OT today to see if any modalities can help with her swelling  Hillman Attig M. 12/24/2015, 8:51 AM

## 2015-12-24 NOTE — Discharge Summary (Signed)
Physician Discharge Summary  [name]:[id number] DOB: 1924/01/04 DOA: 12/17/2015  PCP: Allean Found, MD  Admit date: 12/17/2015 Discharge date: 12/24/2015  Time spent: 40 minutes  Recommendations for Outpatient Follow-up:  1. To up with the nursing home M.D. 2. Eliquis 10 mg twice a day for only 3 more days, then switch to 5 mg twice a day   Discharge Diagnoses:  Principal Problem:   Pulmonary embolism (HCC) Active Problems:   Aortic stenosis, severe   Essential hypertension   SAH (subarachnoid hemorrhage) (HCC)   Diastolic dysfunction-grade 2   Fracture of humerus, left, closed   Humerus fracture   Discharge Condition: Stable  Diet recommendation: Heart healthy  Filed Weights   12/18/15 0537  Weight: 74.2 kg (163 lb 9.3 oz)    History of present illness: According to the admitting M.D. [name] is a 82 y.o. female   has a past medical history of Aortic valve disorders; Allergic rhinitis; Arthritis; Dyslipidemia; Obesity; Glaucoma; Hypercholesteremia; Vertigo; and Hypertension.   Presented with shortness of breath and then developed a arm pain after EMS seen her. Patient sustained a fall in and of January and presented on February 1 to March department where she was diagnosed with subarachnoid hemorrhage as well as Colles' fracture of right wrist she was discharged to nursing home [name] facility from ER. Patient was discharged back at home 3 days ago. She has been complaining of shortness of breath or leg swelling in mainly trouble swallowing. Family thinks she may have had a panic atacck. Usually patietn has good memory and recall but she is unsure about any significant shortness of breath today. Patient required opted to assist to use the restroom. While EMS arrived to evaluate her for shortness of breath and felt a pop in the left shoulder as patient was standing and she started to complain about left shoulder pain. Patient was taken to  emerge department. At baseline patient at times a pleasantly confused. Per family she was eating breakfast well and had no choking episodes. Patient denies any cough fevers or chest pain but history is unreliable. Family stated the lower extremity has been swollen ever since rehabilitation stay  IN ER: Chest x-ray show no acute disease that was noted to have acute fracture proximal left humerus Which was confirmed by left shoulder film. Calcium 8.8 troponin within normal limits white blood cell count 9.6 and she afebrile heart rate 77 satting 99% RA   Hospital Course:   Bilateral acute pulmonary emboli Heart strain considered by CT findings however no hemodynamic instability and no hypoxia.  Troponins negative. No acute changes in her EKG. 2-D echo showed no evidence of significant heart strain. Patient was on continuous heparin drip, switched to Eliquis.  Left humeral fracture with swelling Fracture of the surgical neck of the left humerus, seen by orthopedics on admission recommended nonoperative management. Patient has splint, developed swelling on the morning of 12/22/15. Seen by Dr. Shelle Iron , x-ray repeated showed minimal displacement. No evidence of compartment syndrome. Continue sling/immobilizer, elevate limb, avoid Ace wraps above the elbow, it could cause tourniquet effect. Follow-up with PT/OT in the nursing home.  Right lower extremity DVT Continue Eliquis for VTE treatment  Normocytic anemia Suspect dilution. No evidence of obvious bleeding.  Small traumatic subarachnoid hemorrhage, Colles' fracture right wrist status post fall 10/2015.  Asymptomatic. Repeat CT scan on admission showed no evidence of bleeding.  Admitting physician discussed the case with neurosurgery felt it was safe to anticoagulate.  Severe aortic stenosis Seen by cardiology in past, felt to be candidate for TAVR, referred to primary cardiologist for timing of  procedure.   Procedures:  None  Consultations:  None  Discharge Exam: Filed Vitals:   12/23/15 2058 12/24/15 0545  BP: 135/46 142/41  Pulse: 88 86  Temp: 98.1 F (36.7 C) 98.2 F (36.8 C)  Resp: 20 20   General: Alert and awake HEENT: anicteric sclera, pupils reactive to light and accommodation, EOMI CVS: S1-S2 clear, no murmur rubs or gallops Chest: clear to auscultation bilaterally, no wheezing, rales or rhonchi Abdomen: soft nontender, nondistended, normal bowel sounds, no organomegaly Extremities: no cyanosis, clubbing or edema noted bilaterally Neuro: Cranial nerves II-XII intact, no focal neurological deficits   Discharge Instructions   Discharge Instructions    Diet - low sodium heart healthy    Complete by:  As directed      Increase activity slowly    Complete by:  As directed           Current Discharge Medication List    START taking these medications   Details  apixaban (ELIQUIS) 5 MG TABS tablet Take 10 mg twice a day for only 7 days then 5 mg twice a day      CONTINUE these medications which have CHANGED   Details  oxyCODONE-acetaminophen (PERCOCET/ROXICET) 5-325 MG tablet Take 1 tablet by mouth every 3 (three) hours as needed for moderate pain or severe pain. Qty: 10 tablet, Refills: 0      CONTINUE these medications which have NOT CHANGED   Details  acetaminophen (TYLENOL) 500 MG tablet Take 1 tablet (500 mg total) by mouth every 6 (six) hours as needed for moderate pain or headache. Qty: 30 tablet, Refills: 0    aspirin EC 81 MG tablet Take 81 mg by mouth daily.    ezetimibe-simvastatin (VYTORIN) 10-20 MG per tablet Take 1 tablet by mouth daily.    metoprolol succinate (TOPROL-XL) 25 MG 24 hr tablet Take 1 tablet (25 mg total) by mouth daily. Qty: 30 tablet, Refills: 6    Multiple Vitamin (MULTIVITAMIN WITH MINERALS) TABS tablet Take 1 tablet by mouth daily.    polyethylene glycol (MIRALAX / GLYCOLAX) packet Take 17 g by mouth  daily.    senna-docusate (SENOKOT-S) 8.6-50 MG tablet Take 1 tablet by mouth 2 (two) times daily. Qty: 30 tablet, Refills: 0    TRAVATAN Z 0.004 % SOLN ophthalmic solution Place 1 drop into both eyes at bedtime.        No Known Allergies Follow-up Information    Follow up with Allean Found, MD In 1 week.   Specialty:  Family Medicine   Contact information:   [phone] [address] [place] [address] [phone]       Follow up with [facility].   Specialty:  Skilled Nursing Facility   Contact information:   [address] [place] [address] [phone]       The results of significant diagnostics from this hospitalization (including imaging, microbiology, ancillary and laboratory) are listed below for reference.    Significant Diagnostic Studies: Dg Chest 1 View  12/17/2015  CLINICAL DATA:  Acute left humerus fracture. The patient fell inside her house today. EXAM: CHEST 1 VIEW COMPARISON:  11/21/2015 FINDINGS: The heart size and pulmonary vascularity are normal and the lungs are clear. Acute slightly displaced fracture of the proximal left humeral shaft is noted. Severe arthritic changes of both shoulders are stable. IMPRESSION: No  acute disease in the chest. Acute fracture of the proximal left humerus. Electronically Signed   By: Francene Boyers M.D.   On: 12/17/2015 15:25   Ct Head Wo Contrast  12/17/2015  CLINICAL DATA:  82 year old female with recent fall lab presenting with shortness of breath and bilateral lower extremity swelling. Clinical concern for acute encephalopathy. EXAM: CT HEAD WITHOUT CONTRAST TECHNIQUE: Contiguous axial images were obtained from the base of the skull through the vertex without intravenous contrast. COMPARISON:  CT dated 11/21/2015 FINDINGS: There is mild prominence of the ventricles and sulci compatible with age-related atrophy. Moderate periventricular and deep white matter chronic microvascular ischemic  changes noted. There is no acute intracranial hemorrhage. No mass effect or midline shift. The visualized paranasal sinuses and mastoid air cells are clear. The calvarium is intact. IMPRESSION: No acute intracranial hemorrhage. Age-related atrophy and chronic microvascular ischemic disease. If symptoms persist and there are no contraindications, MRI may provide better evaluation if clinically indicated. Electronically Signed   By: Elgie Collard M.D.   On: 12/17/2015 21:13   Ct Angio Chest Pe W/cm &/or Wo Cm  12/17/2015  ADDENDUM REPORT: 12/17/2015 23:55 ADDENDUM: Acute findings discussed with and reconfirmed by Dr.MARCY PFEIFFER on 12/17/2015 at 11:55 pm. Electronically Signed   By: Awilda Metro M.D.   On: 12/17/2015 23:55  12/17/2015  CLINICAL DATA:  Recent fall, now with shortness of breath and bilateral leg swelling. Acute LEFT humerus fracture. History of hypertension, hyperlipidemia. EXAM: CT ANGIOGRAPHY CHEST WITH CONTRAST TECHNIQUE: Multidetector CT imaging of the chest was performed using the standard protocol during bolus administration of intravenous contrast. Multiplanar CT image reconstructions and MIPs were obtained to evaluate the vascular anatomy. CONTRAST:  OMNIPAQUE IOHEXOL 350 MG/ML SOLN COMPARISON:  Chest radiograph December 17, 2015 at 1511 hours FINDINGS: PULMONARY ARTERY: Adequate contrast opacification of the pulmonary artery's. Main pulmonary artery is not enlarged. Mild expansile filling defect within the lingula lobar pulmonary artery casting to the segmental and subsegmental branches with occlusion. Acute occlusive LEFT lower lobe segmental to subsegmental pulmonary embolus. Strandy filling defects RIGHT upper lobe segmental, RIGHT middle lobe segmental arteries. RIGHT lower lobe segmental to subsegmental acute pulmonary embolus. MEDIASTINUM: Heart size is mildly enlarged. Suspected RIGHT heart strain objectively though, limited assessment due to cardiac motion. Moderate  coronary artery calcifications. No pericardial effusions. Thoracic aorta is normal course and caliber, mild calcific atherosclerosis. No lymphadenopathy by CT size criteria. LUNGS: Tracheobronchial tree is patent, no pneumothorax. No pleural effusions, focal consolidations, pulmonary nodules or masses. LEFT lower lobe small calcified granuloma. SOFT TISSUES AND OSSEOUS STRUCTURES: Included view of the abdomen is nonacute; small hiatal hernia. Acute proximal LEFT humerus fracture. Severe degenerative changes of bilateral shoulders, with screw backout of RIGHT glenoid fossa. Old bilateral anterior with subacute to chronic rib fractures. Osteopenia. Review of the MIP images confirms the above findings. IMPRESSION: Multiple acute bilateral occlusive pulmonary emboli, predominately segmental the subsegmental. Suspected RIGHT heart strain though limited by cardiac motion. Acute proximal LEFT humerus fracture. Electronically Signed: By: Awilda Metro M.D. On: 12/17/2015 23:51   Dg Shoulder Left  12/17/2015  CLINICAL DATA:  Fall today inside her house getting in a chair in her living room. Left arm pain, no chest complaints, EXAM: LEFT SHOULDER - 2+ VIEW COMPARISON:  Shoulder radiograph 11/21/2015 FINDINGS: Oblique fracture through the proximal diaphysis LEFT humerus. Impaction type fracture of the surgical neck of the humerus. Severe arthropathy of the joint with erosion of the humeral head. IMPRESSION: 1. Acute Oblique  fracture through the proximal humeral diaphysis. 2. Acute Impaction fracture of the surgical neck of the humerus. 3. Chronic arthropathy of the humeral head with severe joint space loss and remodeling. Electronically Signed   By: Genevive BiStewart  Edmunds M.D.   On: 12/17/2015 15:23   Dg Humerus Left  12/22/2015  CLINICAL DATA:  Pain in left humerus, known left humerus fracture with clinical concern for worsening EXAM: LEFT HUMERUS - 2+ VIEW COMPARISON:  12/17/2015 FINDINGS: Severe arthritic change at the  glenohumeral joint. Oblique mildly displaced fracture proximal left humeral shaft. Fracture fragments separated by about 8 mm representing mild increased when compared to prior study. Impaction fracture surgical neck of the humerus stable. IMPRESSION: Fractures of the surgical neck and proximal shaft of the humerus with mildly increased displacement of fracture fragments in the proximal diaphysis. Electronically Signed   By: Esperanza Heiraymond  Rubner M.D.   On: 12/22/2015 10:24    Microbiology: Recent Results (from the past 240 hour(s))  MRSA PCR Screening     Status: None   Collection Time: 12/18/15  6:21 AM  Result Value Ref Range Status   MRSA by PCR NEGATIVE NEGATIVE Final    Comment:        The GeneXpert MRSA Assay (FDA approved for NASAL specimens only), is one component of a comprehensive MRSA colonization surveillance program. It is not intended to diagnose MRSA infection nor to guide or monitor treatment for MRSA infections.      Labs: Basic Metabolic Panel:  Recent Labs Lab 12/17/15 1609 12/18/15 0602 12/23/15 0543 12/24/15 0457  NA 139 139 137 135  K 3.8 3.8 4.2 4.4  CL 106 107 105 103  CO2 24 25 25 26   GLUCOSE 91 112* 107* 97  BUN 16 10 13 15   CREATININE 0.49 0.46 0.51 0.45  CALCIUM 8.8* 8.5* 8.5* 8.3*  MG  --  1.6*  --   --   PHOS  --  2.8  --   --    Liver Function Tests:  Recent Labs Lab 12/18/15 0602  AST 25  ALT 12*  ALKPHOS 102  BILITOT 0.9  PROT 5.7*  ALBUMIN 3.1*   No results for input(s): LIPASE, AMYLASE in the last 168 hours. No results for input(s): AMMONIA in the last 168 hours. CBC:  Recent Labs Lab 12/17/15 1609  12/21/15 0502 12/22/15 0608 12/22/15 1038 12/23/15 0543 12/24/15 0457  WBC 9.6  < > 11.5* 12.3* 13.8* 10.1 9.5  NEUTROABS 5.9  --   --   --   --   --   --   HGB 12.3  < > 8.9* 7.5* 9.4* 8.4* 8.1*  HCT 38.8  < > 27.9* 23.9* 29.7* 26.8* 26.1*  MCV 90.7  < > 90.9 91.9 90.8 90.2 92.9  PLT 223  < > 254 285 159 306 330  < > =  values in this interval not displayed. Cardiac Enzymes:  Recent Labs Lab 12/17/15 1609 12/18/15 0037 12/18/15 0602 12/18/15 1144  TROPONINI <0.03 <0.03 <0.03 <0.03   BNP: BNP (last 3 results)  Recent Labs  12/17/15 1609  BNP 86.1    ProBNP (last 3 results) No results for input(s): PROBNP in the last 8760 hours.  CBG: No results for input(s): GLUCAP in the last 168 hours.     Signed:  Clint LippsELMAHI,Wilhelmine Krogstad A MD.  [facility] 12/24/2015, 10:37 AM

## 2015-12-24 NOTE — Progress Notes (Signed)
Subjective: Left arm swelling\\   Objective: Vital signs in last 24 hours: Temp:  [98.1 F (36.7 C)-98.2 F (36.8 C)] 98.2 F (36.8 C) (03/06 0545) Pulse Rate:  [86-88] 86 (03/06 0545) Resp:  [20] 20 (03/06 0545) BP: (135-142)/(41-46) 142/41 mmHg (03/06 0545) SpO2:  [100 %] 100 % (03/06 0545)  Intake/Output from previous day: 03/05 0701 - 03/06 0700 In: 480 [P.O.:480] Out: -  Intake/Output this shift: Total I/O In: 200 [P.O.:200] Out: -    Recent Labs  12/22/15 0608 12/22/15 1038 12/23/15 0543 12/24/15 0457  HGB 7.5* 9.4* 8.4* 8.1*    Recent Labs  12/23/15 0543 12/24/15 0457  WBC 10.1 9.5  RBC 2.97* 2.81*  HCT 26.8* 26.1*  PLT 306 330    Recent Labs  12/23/15 0543 12/24/15 0457  NA 137 135  K 4.2 4.4  CL 105 103  CO2 25 26  BUN 13 15  CREATININE 0.51 0.45  GLUCOSE 107* 97  CALCIUM 8.5* 8.3*   No results for input(s): LABPT, INR in the last 72 hours.  Neurologically intact Neurovascular intact Intact pulses distally Dorsiflexion/Plantar flexion intact Compartment soft Swelling still significant left arm. Assessment/Plan: Left humerus fracture with swelling persistant. OT wrap . Elevation. Not ready for SNF [facility] as OOB difficult. Need to get swelling down.    Agape Hardiman C 12/24/2015, 5:03 PM

## 2015-12-24 NOTE — Clinical Social Work Placement (Addendum)
Patient is set to [facility] today. [facility]. Patient & daughter, [name] aware. Discharge packet given to RN, Charline BillsKristyn. [facility] called for transport.     Lincoln MaxinKelly Snigdha Howser, LCSW [facility] Clinical Social Worker cell #: [phone]    CLINICAL SOCIAL WORK PLACEMENT  NOTE  Date:  12/24/2015  Patient Details  Name: [name] MRN: [id number] Date of Birth: [birth year]  Clinical Social Work is seeking post-discharge placement for this patient at the Skilled  Nursing Facility level of care (*CSW will initial, date and re-position this form in  chart as items are completed):  Yes   Patient/family provided with [place] Clinical Social Work Department's list of facilities offering this level of care within the geographic area requested by the patient (or if unable, by the patient's family).  Yes   Patient/family informed of their freedom to choose among providers that offer the needed level of care, that participate in Medicare, Medicaid or managed care program needed by the patient, have an available bed and are willing to accept the patient.  Yes   Patient/family informed of [facility]'s ownership interest in [facility] and [facility], as well as of the fact that they are under no obligation to receive care at these facilities.  PASRR submitted to EDS on 12/21/15     PASRR number received on 12/21/15     Existing PASRR number confirmed on       FL2 transmitted to all facilities in geographic area requested by pt/family on 12/21/15     FL2 transmitted to all facilities within larger geographic area on       Patient informed that his/her managed care company has contracts with or will negotiate with certain facilities, including the following:        Yes   Patient/family informed of bed offers received.  [facility]     Physician recommends and patient chooses bed at      [facility]  [facility].  Patient to be transferred to facility by [facility]     Patient family notified on 12/24/15 of transfer.  Name of family member notified:  patient's daughter, [name] at bedside     PHYSICIAN       Additional Comment:    _______________________________________________ Arlyss RepressHarrison, Maitri Schnoebelen F, LCSW 12/24/2015, 3:50 PM

## 2015-12-24 NOTE — Evaluation (Signed)
Occupational Therapy Evaluation Patient Details Name: [name] MRN: [id number] DOB: 1924/09/07 Today's Date: 12/24/2015    History of Present Illness 82 yo female adm with SOB, found to have multiple PE; felt a pop L shoulder when being transported by EMS, xray + L humerus fx; past medical history of Aortic valve disorders; Allergic rhinitis; Arthritis; Dyslipidemia; Obesity; Glaucoma; Hypercholesteremia; Vertigo; and Hypertension, R radius fx, hx of falls   Clinical Impression  Pt admitted with L humeral fracture. Pt currently with functional limitations due to the deficits listed below (see OT Problem List).  Pt will benefit from skilled OT to increase their safety and independence with ADL and functional mobility for ADL to facilitate discharge to venue listed below.      Follow Up Recommendations  SNF    Equipment Recommendations  None recommended by OT       Precautions / Restrictions  LUE MUST BE PROPPED ON 2 PILLOWS PER MD for edema when in bed SLING MUST BE ON WHEN UP.       Mobility Bed Mobility              NT    Transfers          NT                 ADL Overall ADL's : Needs assistance/impaired                                                       Pertinent Vitals/Pain Pain Assessment: No/denies pain     Hand Dominance     Extremity/Trunk Assessment Upper Extremity Assessment RUE Deficits / Details: wrist splint; shoulder flexion to 90*, elbow AROM WFL, strength grossly 3/5 LUE Deficits / Details: extends and flexes digits; Pts LUE VERY swollen!              Cognition Arousal/Alertness: Awake/alert Behavior During Therapy: WFL for tasks assessed/performed Overall Cognitive Status: Within Functional Limits for tasks assessed                     General Comments   OT session focused on edema control.  MD gave OT permission to lymphedema wrap pts LUE for edema control as edema was SIGNIFICANT!   MD also gave permission for LUE to be propped on 2 pillows with pillows in pts axilla area to give edema opportunity to decrease in LUE    Exercises  Encouraged pt to perform wrist and finger ROM      Shoulder Instructions  sling when OOB and LUE propped on 2 pillows when in bed    Home Living Family/patient expects to be discharged to:: Skilled nursing facility                                             OT Diagnosis: Generalized weakness;Other (comment) (edema)   OT Problem List: Decreased range of motion;Decreased coordination;Decreased knowledge of precautions;Decreased strength;Increased edema;Impaired UE functional use   OT Treatment/Interventions: Therapeutic activities;Patient/family education;Self-care/ADL training;Therapeutic exercise;Other (comment) (lymphedema wrap per MD)    OT Goals(Current goals can be found in the care plan section)    OT Frequency: Min 3X/week   Barriers to D/C: Decreased  caregiver support             End of Session Nurse Communication: Mobility status;Precautions;Other (comment);Patient requests pain meds (lymphedema wrap)  Activity Tolerance: Patient tolerated treatment well Patient left: in bed;with bed alarm set;with family/visitor present   Time: 1610-9604 OT Time Calculation (min): 77 min Charges:  OT General Charges $OT Visit: 1 Procedure OT Evaluation $OT Eval Moderate Complexity: 1 Procedure OT Treatments $Therapeutic Activity: 38-52 mins G-Codes:    Einar Crow D 01/03/2016, 7:07 PM

## 2015-12-24 NOTE — Progress Notes (Signed)
Physical Therapy Treatment Patient Details Name: [name] MRN: [id number] DOB: 07/01/1924 Today's Date: 12/24/2015    History of Present Illness 82 yo female adm with SOB, found to have multiple PE; felt a pop L shoulder when being transported by EMS, xray + L humerus fx; past medical history of Aortic valve disorders; Allergic rhinitis; Arthritis; Dyslipidemia; Obesity; Glaucoma; Hypercholesteremia; Vertigo; and Hypertension, R radius fx, hx of falls    PT Comments    Pt pleasant and cooperative; continue to recommend SNF as pt is at significant risk for falls; reviewed sling position and precautions with pt; see below for further info  Follow Up Recommendations  SNF;Supervision/Assistance - 24 hour     Equipment Recommendations  None recommended by PT    Recommendations for Other Services       Precautions / Restrictions Precautions Precautions: Fall Required Braces or Orthoses: Other Brace/Splint;Sling Other Brace/Splint: R wrist splint; L shoulder immobilizer; shoulder splint has been removed Restrictions Weight Bearing Restrictions: Yes LUE Weight Bearing: Non weight bearing Other Position/Activity Restrictions: L shoulder immobilizer at all times per Dr. Shelle Iron, avoid abduction/shoulder flexion per Dr. Shelle Iron    Mobility  Bed Mobility Overal bed mobility: Needs Assistance Bed Mobility: Supine to Sit;Sit to Supine     Supine to sit: Min assist Sit to supine: Min assist   General bed mobility comments: assist with trunk to upright, LEs to supine; incr time and cues to avoid use LUE  Transfers Overall transfer level: Needs assistance Equipment used: None Transfers: Sit to/from Stand Sit to Stand: Min assist;Min guard         General transfer comment: assist to steady during wt shift to stand  Ambulation/Gait Ambulation/Gait assistance: Min assist Ambulation Distance (Feet): 4 Feet Assistive device: 1 person hand held assist       General Gait  Details: assist for balance during lateral steps along EOB   Stairs            Wheelchair Mobility    Modified Rankin (Stroke Patients Only)       Balance                                    Cognition Arousal/Alertness: Awake/alert Behavior During Therapy: WFL for tasks assessed/performed         Memory: Decreased short-term memory (diminished recall precautions with LUE)              Exercises      General Comments General comments (skin integrity, edema, etc.): L UE edematous, encouraged fing flexion adn extension; shoulder sling donned and adjusted to correct position; sling was not on upon PT entering room      Pertinent Vitals/Pain Pain Assessment: No/denies pain    Home Living                      Prior Function            PT Goals (current goals can now be found in the care plan section) Acute Rehab PT Goals Patient Stated Goal: pt wants to get back home with her dtr PT Goal Formulation: With patient Time For Goal Achievement: 01/02/16 Potential to Achieve Goals: Fair Progress towards PT goals: Progressing toward goals    Frequency  Min 2X/week    PT Plan Current plan remains appropriate    Co-evaluation  End of Session   Activity Tolerance: Patient tolerated treatment well Patient left: in bed;with call bell/phone within reach;with bed alarm set;with family/visitor present     Time: 4540-98111406-1426 PT Time Calculation (min) (ACUTE ONLY): 20 min  Charges:  $Therapeutic Activity: 8-22 mins                    G Codes:      [name] 12/24/2015, 2:27 PM

## 2015-12-24 NOTE — Progress Notes (Signed)
[facility] to [facility]. Hospital course reviewed and all questions answered. Julio SicksK. Brinlee Gambrell RN

## 2015-12-25 LAB — CBC
HCT: 26.2 % — ABNORMAL LOW (ref 36.0–46.0)
Hemoglobin: 8.2 g/dL — ABNORMAL LOW (ref 12.0–15.0)
MCH: 28.9 pg (ref 26.0–34.0)
MCHC: 31.3 g/dL (ref 30.0–36.0)
MCV: 92.3 fL (ref 78.0–100.0)
PLATELETS: 376 10*3/uL (ref 150–400)
RBC: 2.84 MIL/uL — ABNORMAL LOW (ref 3.87–5.11)
RDW: 15.9 % — AB (ref 11.5–15.5)
WBC: 7.6 10*3/uL (ref 4.0–10.5)

## 2015-12-25 LAB — CBC AND DIFFERENTIAL: WBC: 7.6 10*3/mL

## 2015-12-25 NOTE — Progress Notes (Signed)
Occupational Therapy Treatment Patient Details Name: [name] MRN: [id number] DOB: [birth year] Today's Date: 12/25/2015    History of present illness 82 yo female adm with SOB, found to have multiple PE; felt a pop L shoulder when being transported by EMS, xray + L humerus fx; past medical history of Aortic valve disorders; Allergic rhinitis; Arthritis; Dyslipidemia; Obesity; Glaucoma; Hypercholesteremia; Vertigo; and Hypertension, R radius fx, hx of falls   OT comments  Edema decreased significantly in LUE except  In axilla area.  RN aware. OT called [facility] is awaiting a return call regarding wrapping at SNF.  Feel several more days of wrapping will benefit pt in decreasing edema.      Follow Up Recommendations  SNF    Equipment Recommendations  None recommended by OT    Recommendations for Other Services      Precautions / Restrictions Precautions Precautions: Fall Required Braces or Orthoses: Other Brace/Splint;Sling Other Brace/Splint: R wrist splint; L shoulder immobilizer; shoulder splint has been removed Restrictions Weight Bearing Restrictions: Yes LUE Weight Bearing: Non weight bearing Other Position/Activity Restrictions: Elevate on 2 pillows in bed. Sling when OOB              ADL                                                 Cognition   Behavior During Therapy: [facility] for tasks assessed/performed         Memory: Decreased short-term memory (diminished recall precautions with LUE)               Extremity/Trunk Assessment       edema decreased - but still present        Exercises  elbow, wrist and hand ROM   Shoulder Instructions  ELEVATE on 2 pillows     General Comments  OT removed wraps. OT will return to rewrap this day.  Left wrap off for MD see progress    Pertinent Vitals/ Pain       Pain Assessment: Faces Pain Score: 4  Faces Pain Scale: Hurts little more Pain Location: with unwrapping Pain  Descriptors / Indicators: Sore Pain Intervention(s): Monitored during session;Patient requesting pain meds-RN notified         Frequency Min 3X/week        Plan Discharge plan remains appropriate    Co-evaluation                 End of Session     Activity Tolerance Patient tolerated treatment well   Patient Left in bed;with bed alarm set;with family/visitor present   Nurse Communication Mobility status;Precautions;Other (comment);Patient requests pain meds (lymphedema wrap)        Time: [id number] OT Time Calculation (min): 45 min  Charges: OT General Charges $OT Visit: 1 Procedure OT Treatments $Therapeutic Activity: 38-52 mins  [name], Metro KungLorraine D 12/25/2015, 9:43 AM

## 2015-12-25 NOTE — Progress Notes (Signed)
Occupational Therapy Treatment Patient Details Name: [name] MRN: [id number] DOB: Sep 10, 1924 Today's Date: 12/25/2015    History of present illness 82 yo female adm with SOB, found to have multiple PE; felt a pop L shoulder when being transported by EMS, xray + L humerus fx; past medical history of Aortic valve disorders; Allergic rhinitis; Arthritis; Dyslipidemia; Obesity; Glaucoma; Hypercholesteremia; Vertigo; and Hypertension, R radius fx, hx of falls   OT comments  Pts edema had increased with wraps off for several hours. Wraps replaced and LUe elevated.  OT spoke with PA regarding pts confusion, pain and edema.  RN also aware  OT has not received return phone call from SNF as of 1310. Will re try.   Follow Up Recommendations  SNF    Equipment Recommendations  None recommended by OT    Recommendations for Other Services      Precautions / Restrictions Precautions Precautions: Fall Required Braces or Orthoses: Other Brace/Splint;Sling Other Brace/Splint: R wrist splint; L shoulder immobilizer; shoulder splint has been removed Restrictions Weight Bearing Restrictions: Yes LUE Weight Bearing: Non weight bearing Other Position/Activity Restrictions: Elevate on 2 pillows in bed. Sling when OOB                              Cognition   Behavior During Therapy: [facility]FL for tasks assessed/performed Overall Cognitive Status: Impaired/Different from baseline (pt with increased confusion- RN aware)       Memory: Decreased short-term memory (diminished recall precautions with LUE)                 Exercises  finger ROM   Shoulder Instructions  elevate on 2 pillows          Pertinent Vitals/ Pain       Pain Assessment: Faces Pain Score: 4  Faces Pain Scale: Hurts little more Pain Location: with unwrapping Pain Descriptors / Indicators: Sore Pain Intervention(s): Monitored during session;Patient requesting pain meds-RN notified         Frequency  Min 3X/week        Plan Discharge plan remains appropriate    Co-evaluation                 End of Session     Activity Tolerance Patient tolerated treatment well   Patient Left in bed;with bed alarm set;with family/visitor present   Nurse Communication Mobility status;Precautions;Other (comment);Patient requests pain meds (lymphedema wrap)        Time: 9528-41321150-1220 OT Time Calculation (min): 30 min  Charges: OT General Charges $OT Visit: 1 Procedure OT Treatments $Therapeutic Activity: 23-37 mins  Morganna Styles, Karin GoldenLorraine D 12/25/2015, 1:08 PM

## 2015-12-25 NOTE — Discharge Summary (Signed)
Physician Discharge Summary  [name]:[id number] DOB: [birth year] DOA: 12/17/2015  PCP: Allean Found, MD  Admit date: 12/17/2015 Discharge date: 12/25/2015  Time spent: 40 minutes  Recommendations for Outpatient Follow-up:  1. Follow up with the nursing home M.D. 2. Eliquis 10 mg twice a day for only 2 more days, then switch to 5 mg twice a day. 3. OT to follow-up for a lymphedema wrap. 4. Patient to remain in sling left arm when out of bed ambulating. When patient is in bed, elevate left arm on 2 pillows, slightly abducted.   Discharge Diagnoses:  Principal Problem:   Pulmonary embolism (HCC) Active Problems:   Aortic stenosis, severe   Essential hypertension   SAH (subarachnoid hemorrhage) (HCC)   Diastolic dysfunction-grade 2   Fracture of humerus, left, closed   Humerus fracture   Discharge Condition: Stable  Diet recommendation: Heart healthy  Filed Weights   12/18/15 0537  Weight: 74.2 kg (163 lb 9.3 oz)    History of present illness: According to the admitting M.D. [name] is a 82 y.o. female   has a past medical history of Aortic valve disorders; Allergic rhinitis; Arthritis; Dyslipidemia; Obesity; Glaucoma; Hypercholesteremia; Vertigo; and Hypertension.   Presented with shortness of breath and then developed a arm pain after EMS seen her. Patient sustained a fall in and of January and presented on February 1 to March department where she was diagnosed with subarachnoid hemorrhage as well as Colles' fracture of right wrist she was discharged to nursing home [name] facility from ER. Patient was discharged back at home 3 days ago. She has been complaining of shortness of breath or leg swelling in mainly trouble swallowing. Family thinks she may have had a panic atacck. Usually patietn has good memory and recall but she is unsure about any significant shortness of breath today. Patient required opted to assist to use the restroom. While  EMS arrived to evaluate her for shortness of breath and felt a pop in the left shoulder as patient was standing and she started to complain about left shoulder pain. Patient was taken to emerge department. At baseline patient at times a pleasantly confused. Per family she was eating breakfast well and had no choking episodes. Patient denies any cough fevers or chest pain but history is unreliable. Family stated the lower extremity has been swollen ever since rehabilitation stay  IN ER: Chest x-ray show no acute disease that was noted to have acute fracture proximal left humerus Which was confirmed by left shoulder film. Calcium 8.8 troponin within normal limits white blood cell count 9.6 and she afebrile heart rate 77 satting 99% RA   Hospital Course:   Bilateral acute pulmonary emboli Heart strain considered by CT findings however no hemodynamic instability and no hypoxia.  Troponins negative. No acute changes in her EKG. 2-D echo showed no evidence of significant heart strain. Patient was on continuous heparin drip, switched to Eliquis.  Left humeral fracture with swelling Fracture of the surgical neck of the left humerus, seen by orthopedics on admission recommended nonoperative management. Patient has splint, developed swelling on the morning of 12/22/15. Seen by Dr. Shelle Iron , x-ray repeated showed minimal displacement. No evidence of compartment syndrome. Continue sling/immobilizer, elevate limb with at least 2 pillows. Per orthopedic recommendation, lymphedema wrap applied to the left upper extremity  Right lower extremity DVT Continue Eliquis for VTE treatment  Normocytic anemia Suspect dilution. No evidence of obvious bleeding.  Small traumatic subarachnoid hemorrhage, Colles' fracture right  wrist status post fall 10/2015.  Asymptomatic. Repeat CT scan on admission showed no evidence of bleeding.  Admitting physician discussed the case with neurosurgery felt it was safe to  anticoagulate.  Severe aortic stenosis Seen by cardiology in past, felt to be candidate for TAVR, referred to primary cardiologist for timing of procedure.   Procedures:  None  Consultations:  None  Discharge Exam: Filed Vitals:   12/24/15 2321 12/25/15 0500  BP: 105/70 122/93  Pulse: 88 87  Temp: 98.5 F (36.9 C) 98.7 F (37.1 C)  Resp: 20 20   General: Alert and awake HEENT: anicteric sclera, pupils reactive to light and accommodation, EOMI CVS: S1-S2 clear, no murmur rubs or gallops Chest: clear to auscultation bilaterally, no wheezing, rales or rhonchi Abdomen: soft nontender, nondistended, normal bowel sounds, no organomegaly Extremities: no cyanosis, clubbing or edema noted bilaterally Neuro: Cranial nerves II-XII intact, no focal neurological deficits   Discharge Instructions   Discharge Instructions    Care order/instruction    Complete by:  As directed   Patient to remain in sling left arm when out of bed ambulating. When patient is in bed, elevate left arm on 2 pillows, slightly abducted     Diet - low sodium heart healthy    Complete by:  As directed      Diet - low sodium heart healthy    Complete by:  As directed      Increase activity slowly    Complete by:  As directed      Increase activity slowly    Complete by:  As directed      Sling    Complete by:  As directed   Patient to remain in sling left arm when out of bed ambulating. When patient is in bed, elevate left arm on 2 pillows, slightly abducted          Current Discharge Medication List    START taking these medications   Details  apixaban (ELIQUIS) 5 MG TABS tablet Take 10 mg twice a day for only 7 days then 5 mg twice a day      CONTINUE these medications which have CHANGED   Details  oxyCODONE-acetaminophen (PERCOCET/ROXICET) 5-325 MG tablet Take 1 tablet by mouth every 3 (three) hours as needed for moderate pain or severe pain. Qty: 10 tablet, Refills: 0      CONTINUE  these medications which have NOT CHANGED   Details  acetaminophen (TYLENOL) 500 MG tablet Take 1 tablet (500 mg total) by mouth every 6 (six) hours as needed for moderate pain or headache. Qty: 30 tablet, Refills: 0    aspirin EC 81 MG tablet Take 81 mg by mouth daily.    ezetimibe-simvastatin (VYTORIN) 10-20 MG per tablet Take 1 tablet by mouth daily.    metoprolol succinate (TOPROL-XL) 25 MG 24 hr tablet Take 1 tablet (25 mg total) by mouth daily. Qty: 30 tablet, Refills: 6    Multiple Vitamin (MULTIVITAMIN WITH MINERALS) TABS tablet Take 1 tablet by mouth daily.    polyethylene glycol (MIRALAX / GLYCOLAX) packet Take 17 g by mouth daily.    senna-docusate (SENOKOT-S) 8.6-50 MG tablet Take 1 tablet by mouth 2 (two) times daily. Qty: 30 tablet, Refills: 0    TRAVATAN Z 0.004 % SOLN ophthalmic solution Place 1 drop into both eyes at bedtime.        No Known Allergies Follow-up Information    Follow up with Allean Found, MD In 1 week.  Specialty:  Family Medicine   Contact information:   [phone] [address] [place] [address] [phone]       [facility]   Specialty:  Skilled Nursing Facility   Contact information:   [address] [place] [id number] [phone]       The results of significant diagnostics from this hospitalization (including imaging, microbiology, ancillary and laboratory) are listed below for reference.    Significant Diagnostic Studies: Dg Chest 1 View  12/17/2015  CLINICAL DATA:  Acute left humerus fracture. The patient fell inside her house today. EXAM: CHEST 1 VIEW COMPARISON:  11/21/2015 FINDINGS: The heart size and pulmonary vascularity are normal and the lungs are clear. Acute slightly displaced fracture of the proximal left humeral shaft is noted. Severe arthritic changes of both shoulders are stable. IMPRESSION: No acute disease in the chest. Acute fracture of the proximal left  humerus. Electronically Signed   By: Francene BoyersJames  Maxwell M.D.   On: 12/17/2015 15:25   Ct Head Wo Contrast  12/17/2015  CLINICAL DATA:  82 year old female with recent fall lab presenting with shortness of breath and bilateral lower extremity swelling. Clinical concern for acute encephalopathy. EXAM: CT HEAD WITHOUT CONTRAST TECHNIQUE: Contiguous axial images were obtained from the base of the skull through the vertex without intravenous contrast. COMPARISON:  CT dated 11/21/2015 FINDINGS: There is mild prominence of the ventricles and sulci compatible with age-related atrophy. Moderate periventricular and deep white matter chronic microvascular ischemic changes noted. There is no acute intracranial hemorrhage. No mass effect or midline shift. The visualized paranasal sinuses and mastoid air cells are clear. The calvarium is intact. IMPRESSION: No acute intracranial hemorrhage. Age-related atrophy and chronic microvascular ischemic disease. If symptoms persist and there are no contraindications, MRI may provide better evaluation if clinically indicated. Electronically Signed   By: Elgie CollardArash  Radparvar M.D.   On: 12/17/2015 21:13   Ct Angio Chest Pe W/cm &/or Wo Cm  12/17/2015  ADDENDUM REPORT: 12/17/2015 23:55 ADDENDUM: Acute findings discussed with and reconfirmed by Dr.MARCY PFEIFFER on 12/17/2015 at 11:55 pm. Electronically Signed   By: Awilda Metroourtnay  Bloomer M.D.   On: 12/17/2015 23:55  12/17/2015  CLINICAL DATA:  Recent fall, now with shortness of breath and bilateral leg swelling. Acute LEFT humerus fracture. History of hypertension, hyperlipidemia. EXAM: CT ANGIOGRAPHY CHEST WITH CONTRAST TECHNIQUE: Multidetector CT imaging of the chest was performed using the standard protocol during bolus administration of intravenous contrast. Multiplanar CT image reconstructions and MIPs were obtained to evaluate the vascular anatomy. CONTRAST:  100mL OMNIPAQUE IOHEXOL 350 MG/ML SOLN COMPARISON:  Chest radiograph December 17, 2015  at 1511 hours FINDINGS: PULMONARY ARTERY: Adequate contrast opacification of the pulmonary artery's. Main pulmonary artery is not enlarged. Mild expansile filling defect within the lingula lobar pulmonary artery casting to the segmental and subsegmental branches with occlusion. Acute occlusive LEFT lower lobe segmental to subsegmental pulmonary embolus. Strandy filling defects RIGHT upper lobe segmental, RIGHT middle lobe segmental arteries. RIGHT lower lobe segmental to subsegmental acute pulmonary embolus. MEDIASTINUM: Heart size is mildly enlarged. Suspected RIGHT heart strain objectively though, limited assessment due to cardiac motion. Moderate coronary artery calcifications. No pericardial effusions. Thoracic aorta is normal course and caliber, mild calcific atherosclerosis. No lymphadenopathy by CT size criteria. LUNGS: Tracheobronchial tree is patent, no pneumothorax. No pleural effusions, focal consolidations, pulmonary nodules or masses. LEFT lower lobe small calcified granuloma. SOFT TISSUES AND OSSEOUS STRUCTURES: Included view of the abdomen is nonacute; small hiatal  hernia. Acute proximal LEFT humerus fracture. Severe degenerative changes of bilateral shoulders, with screw backout of RIGHT glenoid fossa. Old bilateral anterior with subacute to chronic rib fractures. Osteopenia. Review of the MIP images confirms the above findings. IMPRESSION: Multiple acute bilateral occlusive pulmonary emboli, predominately segmental the subsegmental. Suspected RIGHT heart strain though limited by cardiac motion. Acute proximal LEFT humerus fracture. Electronically Signed: By: Awilda Metro M.D. On: 12/17/2015 23:51   Dg Shoulder Left  12/17/2015  CLINICAL DATA:  Fall today inside her house getting in a chair in her living room. Left arm pain, no chest complaints, EXAM: LEFT SHOULDER - 2+ VIEW COMPARISON:  Shoulder radiograph 11/21/2015 FINDINGS: Oblique fracture through the proximal diaphysis LEFT humerus.  Impaction type fracture of the surgical neck of the humerus. Severe arthropathy of the joint with erosion of the humeral head. IMPRESSION: 1. Acute Oblique fracture through the proximal humeral diaphysis. 2. Acute Impaction fracture of the surgical neck of the humerus. 3. Chronic arthropathy of the humeral head with severe joint space loss and remodeling. Electronically Signed   By: Genevive Bi M.D.   On: 12/17/2015 15:23   Dg Humerus Left  12/22/2015  CLINICAL DATA:  Pain in left humerus, known left humerus fracture with clinical concern for worsening EXAM: LEFT HUMERUS - 2+ VIEW COMPARISON:  12/17/2015 FINDINGS: Severe arthritic change at the glenohumeral joint. Oblique mildly displaced fracture proximal left humeral shaft. Fracture fragments separated by about 8 mm representing mild increased when compared to prior study. Impaction fracture surgical neck of the humerus stable. IMPRESSION: Fractures of the surgical neck and proximal shaft of the humerus with mildly increased displacement of fracture fragments in the proximal diaphysis. Electronically Signed   By: Esperanza Heir M.D.   On: 12/22/2015 10:24    Microbiology: Recent Results (from the past 240 hour(s))  MRSA PCR Screening     Status: None   Collection Time: 12/18/15  6:21 AM  Result Value Ref Range Status   MRSA by PCR NEGATIVE NEGATIVE Final    Comment:        The GeneXpert MRSA Assay (FDA approved for NASAL specimens only), is one component of a comprehensive MRSA colonization surveillance program. It is not intended to diagnose MRSA infection nor to guide or monitor treatment for MRSA infections.      Labs: Basic Metabolic Panel:  Recent Labs Lab 12/23/15 0543 12/24/15 0457  NA 137 135  K 4.2 4.4  CL 105 103  CO2 25 26  GLUCOSE 107* 97  BUN 13 15  CREATININE 0.51 0.45  CALCIUM 8.5* 8.3*   Liver Function Tests: No results for input(s): AST, ALT, ALKPHOS, BILITOT, PROT, ALBUMIN in the last 168 hours. No  results for input(s): LIPASE, AMYLASE in the last 168 hours. No results for input(s): AMMONIA in the last 168 hours. CBC:  Recent Labs Lab 12/22/15 0608 12/22/15 1038 12/23/15 0543 12/24/15 0457 12/25/15 0435  WBC 12.3* 13.8* 10.1 9.5 7.6  HGB 7.5* 9.4* 8.4* 8.1* 8.2*  HCT 23.9* 29.7* 26.8* 26.1* 26.2*  MCV 91.9 90.8 90.2 92.9 92.3  PLT 285 159 306 330 376   Cardiac Enzymes: No results for input(s): CKTOTAL, CKMB, CKMBINDEX, TROPONINI in the last 168 hours. BNP: BNP (last 3 results)  Recent Labs  12/17/15 1609  BNP 86.1    ProBNP (last 3 results) No results for input(s): PROBNP in the last 8760 hours.  CBG: No results for input(s): GLUCAP in the last 168 hours.     Signed:  [facility] A MD.  [facility] 12/25/2015, 2:43 PM

## 2015-12-25 NOTE — Care Management Important Message (Signed)
Important Message  Patient Details  Name: Cheryll DessertColleen M Finazzo MRN: [id number] Date of Birth: 01-Jan-1924   Medicare Important Message Given:  Yes    Haskell FlirtJamison, Deangelo Berns 12/25/2015, 2:13 PMImportant Message  Patient Details  Name: Cheryll DessertColleen M Knauff MRN: [id number] Date of Birth: 01-Jan-1924   Medicare Important Message Given:  Yes    Haskell FlirtJamison, Cedric Mcclaine 12/25/2015, 2:12 PM

## 2015-12-25 NOTE — Progress Notes (Signed)
ANTICOAGULATION CONSULT NOTE - Follow Up Consult  Pharmacy Consult for Apixiban Indication: pulmonary embolus and DVT  Labs:  Recent Labs  12/23/15 0543 12/24/15 0457 12/25/15 0435  HGB 8.4* 8.1* 8.2*  HCT 26.8* 26.1* 26.2*  PLT 306 330 376  CREATININE 0.51 0.45  --    Estimated Creatinine Clearance: 46 mL/min (by C-G formula based on Cr of 0.45).  Assessment: [age] yoF presented to ED on 2/27 with c/o arm pain, SOB, bilateral leg swelling.  PMH significant for HTN, dCHF, severe aortic stenosis, and recent fall resulting in right wrist fracture, and small subarachnoid hemorrhage discharged to rehab at [name] and had transferred home about 3 days ago.  She sustained a left humerus fracture during this transport to ED.  Neurosurgery discussed history of subarachnoid bleed and and agrees with starting anticoagulation since repeat CT head today shows resolution of bleeding.  Pharmacy was consulted to dose Heparin IV from 2/28 -3/3 for new DVT and PE.  Changed to Apixaban on 3/3.  Today, 12/25/2015:  CBC:  Hgb 8.2 remains low/stable.  Plt remain WNL.  Ortho notes continued swelling in LUE, concern for bleed, but no active bleeding noted.  SCr remains stable   Goal of Therapy:  Monitor platelets by anticoagulation protocol: Yes   Plan:   Continue Apixaban 10mg  PO BID x 7 days, then reduce dose to 5mg  PO BID  Continued daily CBC  Continue to monitor for s/s bleeding or thrombosis.  Lynann Beaverhristine Ohm Dentler PharmD, BCPS Pager [phone] 12/25/2015 10:52 AM

## 2015-12-26 ENCOUNTER — Encounter: Payer: Self-pay | Admitting: Internal Medicine

## 2015-12-26 ENCOUNTER — Non-Acute Institutional Stay (SKILLED_NURSING_FACILITY): Payer: Medicare Other | Admitting: Internal Medicine

## 2015-12-26 DIAGNOSIS — D649 Anemia, unspecified: Secondary | ICD-10-CM

## 2015-12-26 DIAGNOSIS — M7989 Other specified soft tissue disorders: Secondary | ICD-10-CM

## 2015-12-26 DIAGNOSIS — K5900 Constipation, unspecified: Secondary | ICD-10-CM | POA: Diagnosis not present

## 2015-12-26 DIAGNOSIS — I824Z1 Acute embolism and thrombosis of unspecified deep veins of right distal lower extremity: Secondary | ICD-10-CM | POA: Diagnosis not present

## 2015-12-26 DIAGNOSIS — S42302A Unspecified fracture of shaft of humerus, left arm, initial encounter for closed fracture: Secondary | ICD-10-CM

## 2015-12-26 DIAGNOSIS — S62101A Fracture of unspecified carpal bone, right wrist, initial encounter for closed fracture: Secondary | ICD-10-CM

## 2015-12-26 DIAGNOSIS — R531 Weakness: Secondary | ICD-10-CM | POA: Diagnosis not present

## 2015-12-26 DIAGNOSIS — I10 Essential (primary) hypertension: Secondary | ICD-10-CM

## 2015-12-26 DIAGNOSIS — I2699 Other pulmonary embolism without acute cor pulmonale: Secondary | ICD-10-CM

## 2015-12-26 NOTE — Progress Notes (Signed)
LOCATION: [facility]  PCP: Allean Found, MD   Code Status: DNR  Goals of care: Advanced Directive information Advanced Directives 12/26/2015  Does patient have an advance directive? Yes  Type of Advance Directive Out of facility DNR (pink MOST or yellow form)  Does patient want to make changes to advanced directive? No - Patient declined  Copy of advanced directive(s) in chart? Yes  Would patient like information on creating an advanced directive? -     Extended Emergency Contact Information Primary Emergency Contact: Evans,Connie Address: [address]          [place] [address] [place] Home Phone: [phone] Mobile Phone: [phone] Relation: Daughter Secondary Emergency Contact: Gypsy [place] Home Phone: [phone] Relation: Daughter   No Known Allergies  Chief Complaint  Patient presents with  . New Admit To SNF    New Admission     HPI:  Patient is a 82 y.o. female seen today for short term rehabilitation post hospital admission from 12/17/15-12/25/15 with acute dyspnea. She was diagnosed with acute bilateral pulmonary embolism. CTA chest was suggestive of right heart strain and echocardioogram showed no right heart strain. She was started on heparin drip and later transitioned to eliquis. She had fracture of of neck of her left humerus, was seen by orthopedics and medical management was recommended. She had a splint placed and developed increased swelling to left arm. A repeat xray showed minimal displacement. Sling or immobilizer was recommended with limb elevation at rest. She was also diagnosed with RLE DVT. She has PMH of severe AS, HTN, arthritis among others. She had a fall with SAH and colle's fracture to right wrist in Jan 2017 and has a brace to it. She is seen in her room today.   Review of Systems:  Constitutional: Negative for fever, chills and diaphoresis. her energy is slowly returning.    HENT: Negative for headache, congestion, nasal discharge, hearing loss, sore throat, difficulty swallowing.   Eyes: Negative for blurred vision, double vision and discharge.  Respiratory: Negative for cough, shortness of breath and wheezing.   Cardiovascular: Negative for chest pain, palpitations, leg swelling.  Gastrointestinal: Negative for heartburn, nausea, vomiting, abdominal pain, loss of appetite, melena, diarrhea and constipation. Had bowel movement was this morning. Genitourinary: Negative for dysuria and flank pain.  Musculoskeletal: Negative for back pain, fall in the facility.  Skin: Negative for itching, rash.  Neurological: Negative for dizziness. Positive for generalized weakness.  Psychiatric/Behavioral: Negative for depression, anxiety.   Past Medical History  Diagnosis Date  . Aortic valve disorders   . Allergic rhinitis   . Arthritis   . Dyslipidemia   . Obesity   . Glaucoma   . Hypercholesteremia   . Vertigo   . Hypertension    Past Surgical History  Procedure Laterality Date  . Appendectomy    . Shoulder surgery    . Bilataral cataracts removal with lens replacement dr. Darel Hong    . Cardiac catheterization      normal coronary arteries   Social History:   reports that she has never smoked. She has never used smokeless tobacco. She reports that she does not drink alcohol or use illicit drugs.  Family History  Problem Relation Age of Onset  . Heart disease Sister   . Stroke Neg Hx   . CAD Neg Hx   . Cancer Neg Hx     Medications:   Medication List  This list is accurate as of: 12/26/15  8:55 AM.  Always use your most recent med list.               acetaminophen 500 MG tablet  Commonly known as:  TYLENOL  Take 1 tablet (500 mg total) by mouth every 6 (six) hours as needed for moderate pain or headache.     apixaban 5 MG Tabs tablet  Commonly known as:  ELIQUIS  Take 10 mg twice a day for only 7 days then 5 mg twice a day     aspirin  EC 81 MG tablet  Take 81 mg by mouth daily.     ezetimibe-simvastatin 10-20 MG tablet  Commonly known as:  VYTORIN  Take 1 tablet by mouth daily.     metoprolol succinate 25 MG 24 hr tablet  Commonly known as:  TOPROL-XL  Take 1 tablet (25 mg total) by mouth daily.     multivitamin with minerals Tabs tablet  Take 1 tablet by mouth daily.     oxyCODONE-acetaminophen 5-325 MG tablet  Commonly known as:  PERCOCET/ROXICET  Take 1 tablet by mouth every 3 (three) hours as needed for moderate pain or severe pain.     polyethylene glycol packet  Commonly known as:  MIRALAX / GLYCOLAX  Take 17 g by mouth daily.     senna-docusate 8.6-50 MG tablet  Commonly known as:  Senokot-S  Take 1 tablet by mouth 2 (two) times daily.     TRAVATAN Z 0.004 % Soln ophthalmic solution  Generic drug:  Travoprost (BAK Free)  Place 1 drop into both eyes at bedtime.        Immunizations: Immunization History  Administered Date(s) Administered  . Tdap 11/21/2015     Physical Exam: Filed Vitals:   12/26/15 0845  BP: 153/80  Pulse: 104  Temp: 99 F (37.2 C)  TempSrc: Oral  Resp: 16  Height: 5\' 3"  (1.6 m)  Weight: 163 lb (73.936 kg)  SpO2: 98%   Body mass index is 28.88 kg/(m^2).  General- elderly female, well built, in no acute distress Head- normocephalic, atraumatic Nose-  no maxillary or frontal sinus tenderness, no nasal discharge Throat- moist mucus membrane  Eyes- PERRLA, EOMI, no pallor, no icterus, no discharge, normal conjunctiva, normal sclera Neck- no cervical lymphadenopathy Cardiovascular- normal s1,s2, no murmur, trace leg edema Respiratory- bilateral clear to auscultation, no wheeze, no rhonchi, no crackles, no use of accessory muscles Abdomen- bowel sounds present, soft, non tender, foley catheter present Musculoskeletal- able to move all 4 extremities, right wrist in brace and left arm with 2+ edema, good radial pulses, extensive bruise to left chest and shoulder  area Neurological- alert and oriented to person, place and time Skin- warm and dry Psychiatry- normal mood and affect    Labs reviewed: Basic Metabolic Panel:  Recent Labs  87/86/76 0602 12/23/15 0543 12/24/15 12/24/15 0457  NA 139 137 135* 135  K 3.8 4.2  --  4.4  CL 107 105  --  103  CO2 25 25  --  26  GLUCOSE 112* 107*  --  97  BUN 10 13 15 15   CREATININE 0.46 0.51 0.4* 0.45  CALCIUM 8.5* 8.5*  --  8.3*  MG 1.6*  --   --   --   PHOS 2.8  --   --   --    Liver Function Tests:  Recent Labs  11/15/15 0552 12/18/15 0602  AST 24 25  ALT 15 12*  ALKPHOS 64 102  BILITOT 0.9 0.9  PROT 6.5 5.7*  ALBUMIN 3.6 3.1*   No results for input(s): LIPASE, AMYLASE in the last 8760 hours. No results for input(s): AMMONIA in the last 8760 hours. CBC:  Recent Labs  11/14/15 1601  12/17/15 1609  12/23/15 0543 12/24/15 0457 12/25/15 12/25/15 0435  WBC 12.6*  < > 9.6  < > 10.1 9.5 7.6 7.6  NEUTROABS 8.2*  --  5.9  --   --   --   --   --   HGB 13.1  < > 12.3  < > 8.4* 8.1*  --  8.2*  HCT 40.8  < > 38.8  < > 26.8* 26.1*  --  26.2*  MCV 91.9  < > 90.7  < > 90.2 92.9  --  92.3  PLT 211  < > 223  < > 306 330  --  376  < > = values in this interval not displayed. Cardiac Enzymes:  Recent Labs  12/18/15 0037 12/18/15 0602 12/18/15 1144  TROPONINI <0.03 <0.03 <0.03   BNP: Invalid input(s): POCBNP CBG: No results for input(s): GLUCAP in the last 8760 hours.  Radiological Exams: Dg Chest 1 View  12/17/2015  CLINICAL DATA:  Acute left humerus fracture. The patient fell inside her house today. EXAM: CHEST 1 VIEW COMPARISON:  11/21/2015 FINDINGS: The heart size and pulmonary vascularity are normal and the lungs are clear. Acute slightly displaced fracture of the proximal left humeral shaft is noted. Severe arthritic changes of both shoulders are stable. IMPRESSION: No acute disease in the chest. Acute fracture of the proximal left humerus. Electronically Signed   By: Francene BoyersJames   Maxwell M.D.   On: 12/17/2015 15:25   Ct Head Wo Contrast  12/17/2015  CLINICAL DATA:  82 year old female with recent fall lab presenting with shortness of breath and bilateral lower extremity swelling. Clinical concern for acute encephalopathy. EXAM: CT HEAD WITHOUT CONTRAST TECHNIQUE: Contiguous axial images were obtained from the base of the skull through the vertex without intravenous contrast. COMPARISON:  CT dated 11/21/2015 FINDINGS: There is mild prominence of the ventricles and sulci compatible with age-related atrophy. Moderate periventricular and deep white matter chronic microvascular ischemic changes noted. There is no acute intracranial hemorrhage. No mass effect or midline shift. The visualized paranasal sinuses and mastoid air cells are clear. The calvarium is intact. IMPRESSION: No acute intracranial hemorrhage. Age-related atrophy and chronic microvascular ischemic disease. If symptoms persist and there are no contraindications, MRI may provide better evaluation if clinically indicated. Electronically Signed   By: Elgie CollardArash  Radparvar M.D.   On: 12/17/2015 21:13   Ct Angio Chest Pe W/cm &/or Wo Cm  12/17/2015  ADDENDUM REPORT: 12/17/2015 23:55 ADDENDUM: Acute findings discussed with and reconfirmed by Dr.MARCY PFEIFFER on 12/17/2015 at 11:55 pm. Electronically Signed   By: Awilda Metroourtnay  Bloomer M.D.   On: 12/17/2015 23:55  12/17/2015  CLINICAL DATA:  Recent fall, now with shortness of breath and bilateral leg swelling. Acute LEFT humerus fracture. History of hypertension, hyperlipidemia. EXAM: CT ANGIOGRAPHY CHEST WITH CONTRAST TECHNIQUE: Multidetector CT imaging of the chest was performed using the standard protocol during bolus administration of intravenous contrast. Multiplanar CT image reconstructions and MIPs were obtained to evaluate the vascular anatomy. CONTRAST:  100mL OMNIPAQUE IOHEXOL 350 MG/ML SOLN COMPARISON:  Chest radiograph December 17, 2015 at 1511 hours FINDINGS: PULMONARY ARTERY:  Adequate contrast opacification of the pulmonary artery's. Main pulmonary artery is not enlarged. Mild expansile filling defect within the lingula lobar  pulmonary artery casting to the segmental and subsegmental branches with occlusion. Acute occlusive LEFT lower lobe segmental to subsegmental pulmonary embolus. Strandy filling defects RIGHT upper lobe segmental, RIGHT middle lobe segmental arteries. RIGHT lower lobe segmental to subsegmental acute pulmonary embolus. MEDIASTINUM: Heart size is mildly enlarged. Suspected RIGHT heart strain objectively though, limited assessment due to cardiac motion. Moderate coronary artery calcifications. No pericardial effusions. Thoracic aorta is normal course and caliber, mild calcific atherosclerosis. No lymphadenopathy by CT size criteria. LUNGS: Tracheobronchial tree is patent, no pneumothorax. No pleural effusions, focal consolidations, pulmonary nodules or masses. LEFT lower lobe small calcified granuloma. SOFT TISSUES AND OSSEOUS STRUCTURES: Included view of the abdomen is nonacute; small hiatal hernia. Acute proximal LEFT humerus fracture. Severe degenerative changes of bilateral shoulders, with screw backout of RIGHT glenoid fossa. Old bilateral anterior with subacute to chronic rib fractures. Osteopenia. Review of the MIP images confirms the above findings. IMPRESSION: Multiple acute bilateral occlusive pulmonary emboli, predominately segmental the subsegmental. Suspected RIGHT heart strain though limited by cardiac motion. Acute proximal LEFT humerus fracture. Electronically Signed: By: Awilda Metro M.D. On: 12/17/2015 23:51   Dg Shoulder Left  12/17/2015  CLINICAL DATA:  Fall today inside her house getting in a chair in her living room. Left arm pain, no chest complaints, EXAM: LEFT SHOULDER - 2+ VIEW COMPARISON:  Shoulder radiograph 11/21/2015 FINDINGS: Oblique fracture through the proximal diaphysis LEFT humerus. Impaction type fracture of the surgical neck  of the humerus. Severe arthropathy of the joint with erosion of the humeral head. IMPRESSION: 1. Acute Oblique fracture through the proximal humeral diaphysis. 2. Acute Impaction fracture of the surgical neck of the humerus. 3. Chronic arthropathy of the humeral head with severe joint space loss and remodeling. Electronically Signed   By: Genevive Bi M.D.   On: 12/17/2015 15:23   Dg Humerus Left  12/22/2015  CLINICAL DATA:  Pain in left humerus, known left humerus fracture with clinical concern for worsening EXAM: LEFT HUMERUS - 2+ VIEW COMPARISON:  12/17/2015 FINDINGS: Severe arthritic change at the glenohumeral joint. Oblique mildly displaced fracture proximal left humeral shaft. Fracture fragments separated by about 8 mm representing mild increased when compared to prior study. Impaction fracture surgical neck of the humerus stable. IMPRESSION: Fractures of the surgical neck and proximal shaft of the humerus with mildly increased displacement of fracture fragments in the proximal diaphysis. Electronically Signed   By: Esperanza Heir M.D.   On: 12/22/2015 10:24    Assessment/Plan  Generalized weakness Will have her work with physical therapy and occupational therapy team to help with gait training and muscle strengthening exercises.fall precautions. Skin care. Encourage to be out of bed.   Acute pulmonary embolism Breathing stable today. Continue eliquis 10 mg bid until 12/28/15 and then 5 mg bid. Will have patient work with PT/OT as tolerated to regain strength and restore function.  Fall precautions are in place.  RLE DVT Continue eliquis as above  Left humerus fracture Continue oxycodone-apap 5-325 mg q3h prn pain, sling when out of bed and keep arms elevated at rest  Left arm edema Good radial pulse, soft compartment on exam, ace wrap to below elbow to help with edema and monitor  Right wrist fracture Continue to wear her brace, continue pain med as above and  monitor  HTN Elevated SBP. Monitor bp bid x 1 week. Continue toprol xl 25 mg daily for now, check bmp  Constipation Stable, continue miralax daily and senokot s bid for now and monitor  Anemia Unspecified, monitor cbc   Goals of care: short term rehabilitation   Labs/tests ordered: cbc, bmp  Family/ staff Communication: reviewed care plan with patient and nursing supervisor    Oneal Grout, MD Internal Medicine [facility] [address] [place], [place] [address] Cell Phone (Monday-Friday 8 am - 5 pm): [phone] On Call: [phone] and follow prompts after 5 pm and on weekends Office Phone: [phone] Office Fax: [phone]

## 2015-12-31 ENCOUNTER — Other Ambulatory Visit: Payer: Self-pay | Admitting: *Deleted

## 2015-12-31 LAB — BASIC METABOLIC PANEL
BUN: 16 mg/dL (ref 4–21)
Creatinine: 0.5 mg/dL (ref 0.5–1.1)
GLUCOSE: 92 mg/dL
POTASSIUM: 4.4 mmol/L (ref 3.4–5.3)
SODIUM: 139 mmol/L (ref 137–147)

## 2015-12-31 LAB — CBC AND DIFFERENTIAL
HEMATOCRIT: 30 % — AB (ref 36–46)
HEMOGLOBIN: 9.1 g/dL — AB (ref 12.0–16.0)
NEUTROS ABS: 5 /uL
Platelets: 495 10*3/uL — AB (ref 150–399)
WBC: 9.6 10^3/mL

## 2015-12-31 MED ORDER — OXYCODONE-ACETAMINOPHEN 5-325 MG PO TABS: ORAL_TABLET | ORAL | Status: DC

## 2015-12-31 NOTE — Telephone Encounter (Signed)
[facility] 

## 2016-01-01 LAB — CBC AND DIFFERENTIAL
HEMATOCRIT: 31 % — AB (ref 36–46)
HEMOGLOBIN: 9.9 g/dL — AB (ref 12.0–16.0)
PLATELETS: 520 10*3/uL — AB (ref 150–399)
WBC: 10.3 10*3/mL

## 2016-01-01 LAB — BASIC METABOLIC PANEL
BUN: 21 mg/dL (ref 4–21)
CREATININE: 0.4 mg/dL — AB (ref 0.5–1.1)
Glucose: 140 mg/dL
Potassium: 4.5 mmol/L (ref 3.4–5.3)
Sodium: 140 mmol/L (ref 137–147)

## 2016-01-17 ENCOUNTER — Ambulatory Visit: Payer: Medicare Other | Admitting: Cardiology

## 2016-01-21 ENCOUNTER — Ambulatory Visit: Payer: Medicare Other | Admitting: Cardiology

## 2016-01-22 ENCOUNTER — Encounter: Payer: Self-pay | Admitting: Adult Health

## 2016-01-22 ENCOUNTER — Non-Acute Institutional Stay (SKILLED_NURSING_FACILITY): Payer: Medicare Other | Admitting: Adult Health

## 2016-01-22 DIAGNOSIS — D649 Anemia, unspecified: Secondary | ICD-10-CM

## 2016-01-22 DIAGNOSIS — S42302A Unspecified fracture of shaft of humerus, left arm, initial encounter for closed fracture: Secondary | ICD-10-CM

## 2016-01-22 DIAGNOSIS — R531 Weakness: Secondary | ICD-10-CM

## 2016-01-22 DIAGNOSIS — I2699 Other pulmonary embolism without acute cor pulmonale: Secondary | ICD-10-CM | POA: Diagnosis not present

## 2016-01-22 DIAGNOSIS — K5900 Constipation, unspecified: Secondary | ICD-10-CM | POA: Diagnosis not present

## 2016-01-22 DIAGNOSIS — I10 Essential (primary) hypertension: Secondary | ICD-10-CM

## 2016-01-22 DIAGNOSIS — S62101A Fracture of unspecified carpal bone, right wrist, initial encounter for closed fracture: Secondary | ICD-10-CM | POA: Diagnosis not present

## 2016-01-22 DIAGNOSIS — I824Z1 Acute embolism and thrombosis of unspecified deep veins of right distal lower extremity: Secondary | ICD-10-CM | POA: Diagnosis not present

## 2016-01-22 NOTE — Progress Notes (Signed)
Patient ID: [name], female   DOB: 05/28/1924, 82 y.o.   MRN: [id number]    DATE:   01/22/16  MRN:  [id number]  BIRTHDAY: [birth year]  Facility:  Nursing Home Location:  [facility]  Nursing Home Room Number: [id number]  LEVEL OF CARE:  SNF [phone])  Contact Information    Name Relation Home Work Mobile   Evans,Connie Daughter [phone]  [phone]   Yaslene, [place] Daughter [phone]         Code Status History    Date Active Date Inactive Code Status Order ID Comments User Context   12/18/2015  2:16 AM 12/25/2015  9:05 PM DNR [id number]  Therisa Doyne, MD ED   11/14/2015  6:29 PM 11/16/2015  8:38 PM DNR [id number]  Albertine Grates, MD Inpatient    Questions for Most Recent Historical Code Status (Order [id number])    Question Answer Comment   In the event of cardiac or respiratory ARREST Do not call a "code blue"    In the event of cardiac or respiratory ARREST Do not perform Intubation, CPR, defibrillation or ACLS    In the event of cardiac or respiratory ARREST Use medication by any route, position, wound care, and other measures to relive pain and suffering. May use oxygen, suction and manual treatment of airway obstruction as needed for comfort.         Advance Directive Documentation   Flowsheet Row Most Recent Value  Type of Advance Directive  Out of facility DNR (pink MOST or yellow form)  Pre-existing out of facility DNR order (yellow form or pink MOST form)  No data  "MOST" Form in Place?  No data       Chief Complaint  Patient presents with  . Discharge Note     HISTORY OF PRESENT ILLNESS:   This is an 82 year old female who is for discharge home with Home health OT for ADLs, PT for endurance, CNA for showers and Skilled Nurse for wound care, medication management and social worker for community resources. DME:  3-in-1 commode and standard lightweight wheelchair with elevating leg rests.   She has been admitted to [facility] on 12/25/15 from  [facility] with acute dyspnea. She was diagnosed with acute bilateral pulmonary embolism. CTA chest was suggestive of right heart strain and echocardiogram showed no right heart strain. She was started on Heparin drip and later transitioned to Eliquis. She had fracture of neck of her left humerus. Orthopedics was consulted and medical management was recommended. She had a splint placed and developed increased swelling to left arm. A repeat x-ray showed minimal displacement. Sling or immobilizer was recommended with limb elevation at rest. She was also diagnosed with RLE DVT. She had a fall with SAH and colle's fractgure to wrist in January 2017 and has a brace to it.  Patient was admitted to this facility for short-term rehabilitation after the patient's recent hospitalization.  Patient has completed SNF rehabilitation and therapy has cleared the patient for discharge.  PAST MEDICAL HISTORY:  Past Medical History:  Diagnosis Date  . Allergic rhinitis   . Aortic valve disorders   . Arthritis   . Dyslipidemia   . Glaucoma   . Hypercholesteremia   . Hypertension   . Obesity   . Vertigo      CURRENT MEDICATIONS: Reviewed  Patient's Medications  New Prescriptions   No medications on file  Previous Medications   ACETAMINOPHEN (TYLENOL) 325 MG TABLET    Take 325  mg by mouth 3 (three) times daily.   APIXABAN (ELIQUIS) 5 MG TABS TABLET    Take 5 mg by mouth 2 (two) times daily.   EZETIMIBE-SIMVASTATIN (VYTORIN) 10-20 MG PER TABLET    Take 1 tablet by mouth daily.    FUROSEMIDE (LASIX) 20 MG TABLET    Take 1 tablet by mouth 2 (two) times daily.   LATANOPROST (XALATAN) 0.005 % OPHTHALMIC SOLUTION    Place 1 drop into both eyes at bedtime.   METOPROLOL SUCCINATE (TOPROL-XL) 25 MG 24 HR TABLET    Take 1 tablet (25 mg total) by mouth daily.   OXYCODONE-ACETAMINOPHEN (PERCOCET/ROXICET) 5-325 MG TABLET    Take 1 tablet by mouth every 8 (eight) hours as needed for severe pain. Give at 6AM,  2PM, 10PM   POLYETHYLENE GLYCOL (MIRALAX / GLYCOLAX) PACKET    Take 17 g by mouth as directed.   Modified Medications   No medications on file  Discontinued Medications   ACETAMINOPHEN (TYLENOL) 500 MG TABLET    Take 1 tablet (500 mg total) by mouth every 6 (six) hours as needed for moderate pain or headache.   APIXABAN (ELIQUIS) 5 MG TABS TABLET    Take 10 mg twice a day for only 7 days then 5 mg twice a day   ASPIRIN EC 81 MG TABLET    Take 81 mg by mouth daily.   MULTIPLE VITAMIN (MULTIVITAMIN WITH MINERALS) TABS TABLET    Take 1 tablet by mouth daily.   OXYCODONE-ACETAMINOPHEN (PERCOCET/ROXICET) 5-325 MG TABLET    Take one tablet by mouth three times daily for pain. Do not exceed 3gm of Tylenol in 24 hours   PROTEIN (PROCEL) POWD    Take 1 scoop by mouth 2 (two) times daily.   SENNA-DOCUSATE (SENOKOT-S) 8.6-50 MG TABLET    Take 1 tablet by mouth 2 (two) times daily.   TRAVATAN Z 0.004 % SOLN OPHTHALMIC SOLUTION    Place 1 drop into both eyes at bedtime.      No Known Allergies   REVIEW OF SYSTEMS:  GENERAL: no change in appetite, no fatigue, no weight changes, no fever, chills or weakness EYES: Denies change in vision, dry eyes, eye pain, itching or discharge EARS: Denies change in hearing, ringing in ears, or earache NOSE: Denies nasal congestion or epistaxis MOUTH and THROAT: Denies oral discomfort, gingival pain or bleeding, pain from teeth or hoarseness   RESPIRATORY: no cough, SOB, DOE, wheezing, hemoptysis CARDIAC: no chest pain, or palpitations GI: no abdominal pain, diarrhea, constipation, heart burn, nausea or vomiting GU: Denies dysuria, frequency, hematuria, incontinence, or discharge PSYCHIATRIC: Denies feeling of depression or anxiety. No report of hallucinations, insomnia, paranoia, or agitation   PHYSICAL EXAMINATION  GENERAL APPEARANCE: Well nourished. In no acute distress. Normal body habitus SKIN:  Skin is warm and dry.  HEAD: Normal in size and contour. No  evidence of trauma EYES: Lids open and close normally. No blepharitis, entropion or ectropion. PERRL. Conjunctivae are clear and sclerae are white. Lenses are without opacity EARS: Pinnae are normal. Patient hears normal voice tunes of the examiner MOUTH and THROAT: Lips are without lesions. Oral mucosa is moist and without lesions. Tongue is normal in shape, size, and color and without lesions NECK: supple, trachea midline, no neck masses, no thyroid tenderness, no thyromegaly LYMPHATICS: no LAN in the neck, no supraclavicular LAN RESPIRATORY: breathing is even & unlabored, BS CTAB CARDIAC: RRR, + murmur,no extra heart sounds, RLE 2+ and LLE 1+ edema GI:  abdomen soft, normal BS, no masses, no tenderness, no hepatomegaly, no splenomegaly EXTREMITIES:  Able to move X 4 extremities; right wrist has brace; left arm on sling; bilateral hand has arthritic fingers PSYCHIATRIC: Alert and oriented X 3. Affect and behavior are appropriate  LABS/RADIOLOGY: Labs reviewed: Basic Metabolic Panel:  Recent Labs  91/47/82 0602 12/23/15 0543  12/24/15 0457 12/31/15 01/01/16  NA 139 137  < > 135 139 140  K 3.8 4.2  --  4.4 4.4 4.5  CL 107 105  --  103  --   --   CO2 25 25  --  26  --   --   GLUCOSE 112* 107*  --  97  --   --   BUN 10 13  < > CREATININE 0.46 0.51  < > 0.45 0.5 0.4*  CALCIUM 8.5* 8.5*  --  8.3*  --   --   MG 1.6*  --   --   --   --   --   PHOS 2.8  --   --   --   --   --   < > = values in this interval not displayed. Liver Function Tests:  Recent Labs  11/15/15 0552 12/18/15 0602  AST 24 25  ALT 15 12*  ALKPHOS 64 102  BILITOT 0.9 0.9  PROT 6.5 5.7*  ALBUMIN 3.6 3.1*   CBC:  Recent Labs  11/14/15 1601  12/17/15 1609  12/23/15 0543 12/24/15 0457  12/25/15 0435 12/31/15 01/01/16  WBC 12.6*  < > 9.6  < > 10.1 9.5  < > 7.6 9.6 10.3  NEUTROABS 8.2*  --  5.9  --   --   --   --   --  5  --   HGB 13.1  < > 12.3  < > 8.4* 8.1*  --  8.2* 9.1* 9.9*  HCT 40.8  < >  38.8  < > 26.8* 26.1*  --  26.2* 30* 31*  MCV 91.9  < > 90.7  < > 90.2 92.9  --  92.3  --   --   PLT 211  < > 223  < > 306 330  --  376 495* 520*  < > = values in this interval not displayed.  Cardiac Enzymes:  Recent Labs  12/18/15 0037 12/18/15 0602 12/18/15 1144  TROPONINI <0.03 <0.03 <0.03    ASSESSMENT/PLAN:  Generalized weakness - for Home health OT, PT, CNA, Skilled Nurse and Child psychotherapist; for therapeutic strengthening exercises; fall precaution  Acute pulmonary embolism - no SOB; continue Eliquis 5 mg 1 tab PO BID; for Home health OT, PT, CNA, Skilled Nurse and Child psychotherapist; for therapeutic strengthening exercises    RLE DVT - continue Eliquis 5 mg 1 tab BID  Left humerus fracture - continue sling; Tylenol 325 mg 1 PO TID and Percocet 5/325 mg 1 tab PO TID; elevate arm at rest  Right wrist fracture - continue wrist brace, Percocet and Tylenol  Hypertension - continue Toprol XL 25 mg 1 tab daily  Constipation - continue Miralax 17 gm daily and Senna-S 1 tab PO BID  Anemia, unspecified - hgb 9.9; stable       I have filled out patient's discharge paperwork and written prescriptions.  Patient will receive home health PT, OT, Social Worker, Nursing and CNA.  DME provided:  3-in-1 commode and standard lightweight wheelchair with elevating leg rests  Total discharge time: Greater than 30 minutes Greater  than 50% was spent in counseling and coordination of care with the patient.   Discharge time involved coordination of the discharge process with social worker, nursing staff and therapy department. Medical justification for home health services/DME verified.   Kenard Gower, NP [employer] [phone]

## 2016-01-23 ENCOUNTER — Encounter: Payer: Self-pay | Admitting: Cardiology

## 2016-06-05 ENCOUNTER — Encounter: Payer: Self-pay | Admitting: Cardiology

## 2016-06-05 ENCOUNTER — Ambulatory Visit (INDEPENDENT_AMBULATORY_CARE_PROVIDER_SITE_OTHER): Payer: Medicare Other | Admitting: Cardiology

## 2016-06-05 VITALS — BP 158/72 | HR 92 | Ht 60.0 in | Wt 168.1 lb

## 2016-06-05 DIAGNOSIS — I350 Nonrheumatic aortic (valve) stenosis: Secondary | ICD-10-CM

## 2016-06-05 NOTE — Patient Instructions (Signed)
  Medication Instructions:   Your physician recommends that you continue on your current medications as directed. Please refer to the Current Medication list given to you today.    If you need a refill on your cardiac medications before your next appointment, please call your pharmacy.  Labwork: NONE ORDER TODAY    Testing/Procedures: NONE ORDER TODAY    Follow-Up: WITH DR TURNER IN 2 TO 3 MONTHS    WITH DR Clifton JamesMCALHANY IN TAVR CLINIC FOR AORTIC STENOSIS (06/12/2016) PER RN  Julieta GuttingLAUREN BROWN   Any Other Special Instructions Will Be Listed Below (If Applicable).

## 2016-06-05 NOTE — Progress Notes (Signed)
06/05/2016 [name]   1924-07-31  [id number]  Primary Physician Allean FoundSMITH,CANDACE THIELE, MD Primary Cardiologist: Dr. Mayford Knifeurner   Reason for Visit/CC: F/u for Aortic Stenosis   HPI:  82 y/o female, followed by Dr. Mayford Knifeurner. She has a history of aortic stenosis, hypertension and HLD.  Her family doctor follows her lipid panel. She had an echocardiogram 08/22/2013. Aortic stenosis at that time was felt to be moderate to severe, also mild regurgitation. Aortic valve area was measured at 1.16 cm. Mean gradient was 41 mmHg. Ventricular ejection fraction was normal at that time at 55-60% with normal wall motion. Grade 2 diastolic dysfunction was noted. It was recommended by Dr. Mayford Knifeurner that she get a repeat echo to reassess progression of her AS in 6 months, however the patient failed to follow-up.   I finally saw her back last September, in 2016, and ordered a repeat echo. She failed to get it done. She ultimately had an echo at [facility] Jan. 2017 after she was admitted for a  Mechanical fall. This showed an EF of 50-55%. Wall motion normal with Grade 1DD. The aortic valve was severely thickened with calcified leaflets with severe stenosis and moderate regurgitation. Peak velocity (S): 460 cm/s. Mean gradient (S): 50 mm Hg. Valve area (VTI): 0.84 cm^2. Valve area (Vmax): 0.74 cm^2.Valve area (Vmean): 0.79 cm^2.  She had a f/u appointment with Dr. Mayford Knifeurner scheduled for 12/2015 but canceled the appointment. In March 2017, she was diagnosed with PE and placed on Eliquis. She was scheduled to see me for post hospital f/u 01/2016 but she "no showed" to the appointment.   She now presents back to clinic for f/u, 1 year since her last OV. She is here with her daughter. She denies angina. She has mild exertional dyspnea. No resting symptoms. No syncope/ near syncope.    Current Outpatient Prescriptions  Medication Sig Dispense Refill  . acetaminophen (TYLENOL) 325 MG tablet Take 325 mg by mouth 3 (three) times  daily.    [name]. apixaban (ELIQUIS) 5 MG TABS tablet Take 5 mg by mouth 2 (two) times daily.    [name]. ezetimibe-simvastatin (VYTORIN) 10-20 MG per tablet Take 1 tablet by mouth daily.     . furosemide (LASIX) 20 MG tablet Take 1 tablet by mouth 2 (two) times daily.  2  . latanoprost (XALATAN) 0.005 % ophthalmic solution Place 1 drop into both eyes at bedtime.    . metoprolol succinate (TOPROL-XL) 25 MG 24 hr tablet Take 1 tablet (25 mg total) by mouth daily. 30 tablet 6  . oxyCODONE-acetaminophen (PERCOCET/ROXICET) 5-325 MG tablet Take 1 tablet by mouth every 8 (eight) hours as needed for severe pain. Give at 6AM, 2PM, 10PM    . polyethylene glycol (MIRALAX / GLYCOLAX) packet Take 17 g by mouth as directed.      No current facility-administered medications for this visit.     No Known Allergies  Social History   Social History  . Marital status: Widowed    Spouse name: N/A  . Number of children: N/A  . Years of education: N/A   Occupational History  . Not on file.   Social History Main Topics  . Smoking status: Never Smoker  . Smokeless tobacco: Never Used  . Alcohol use No  . Drug use: No  . Sexual activity: No   Other Topics Concern  . Not on file   Social History Narrative  . No narrative on file     Review of Systems: General:  negative for chills, fever, night sweats or weight changes.  Cardiovascular: negative for chest pain, dyspnea on exertion, edema, orthopnea, palpitations, paroxysmal nocturnal dyspnea or shortness of breath Dermatological: negative for rash Respiratory: negative for cough or wheezing Urologic: negative for hematuria Abdominal: negative for nausea, vomiting, diarrhea, bright red blood per rectum, melena, or hematemesis Neurologic: negative for visual changes, syncope, or dizziness All other systems reviewed and are otherwise negative except as noted above.    Blood pressure (!) 158/72, pulse 92, height 5' (1.524 m), weight 168 lb 1.9 oz (76.3 kg).    General appearance: alert, cooperative, no distress and elderly, frail  Neck: no JVD and + radiation of murmur to carotids bilaterally Lungs: clear to auscultation bilaterally Heart: irregular rhythm, PACs, harsh 3/6 SM throughout the precordium, loudest at the apex Extremities: trace bilateral LEE Pulses: 2+ and symmetric Skin: warm and dry Neurologic: Grossly normal  EKG SR with PACs 92 bpm   ASSESSMENT AND PLAN:   1. Severe Aortic Stenosis: last echo 10/2015 showed severe AS with mild to moderate AI. AVA of 0.84 cm2. Mean gradient 50 mm Hg. Other than mild exertional dyspnea, she is asymptomatic. No angina, syncope/ near syncope. Overall given her other medical issues and poor overall functional status, I doubt she would be a candidate for traditional aortic valve replacement. We will refer her to the valve clinic with Dr. Excell Seltzerooper and Dr. Clifton JamesMcAlhany for assessment for possible  TAVR.   2. H/o PE: Diagnosed 12/2014. On Eliquis. She denies any issues with bleeding.   3. Hypertension: mild- moderately elevated. Will continue current dose of metoprolol. Will avoid further titration as we will need to avoid hypotension given severe AS.    PLAN  TAVR consultation placed. F/u with Dr. Mayford Knifeurner in 2 months.   Robbie LisBrittainy Simmons PA-C 06/05/2016 4:33 PM

## 2016-06-12 ENCOUNTER — Ambulatory Visit: Payer: Medicare Other | Admitting: Cardiovascular Disease

## 2016-07-04 ENCOUNTER — Encounter (INDEPENDENT_AMBULATORY_CARE_PROVIDER_SITE_OTHER): Payer: Self-pay

## 2016-07-04 ENCOUNTER — Ambulatory Visit (INDEPENDENT_AMBULATORY_CARE_PROVIDER_SITE_OTHER): Payer: Medicare Other | Admitting: Cardiovascular Disease

## 2016-07-04 ENCOUNTER — Encounter: Payer: Self-pay | Admitting: Cardiovascular Disease

## 2016-07-04 VITALS — BP 130/60 | HR 76 | Ht 62.0 in | Wt 172.0 lb

## 2016-07-04 DIAGNOSIS — I350 Nonrheumatic aortic (valve) stenosis: Secondary | ICD-10-CM

## 2016-07-04 NOTE — Progress Notes (Addendum)
Chief Complaint  Patient presents with  . Aortic Stenosis     History of Present Illness: 82 yo female with history of HLD, HTN, anemia and severe aortic valve stenosis who is referred to the valve clinic today to discuss her aortic stenosis. She is followed in our office by Dr. Armanda Magicraci Turner. She has been known to have moderately severe aortic stenosis for several years but has missed return appointments and planned echocardiograms to follow this. She was admitted January 2017 to [facility] after a mechanical fall and suffered a broken right wrist and subarachnoid hemorrhage. She denies having dizziness prior the fall and her family confirmed that she did not lose consciousness. During that hospitalization, echocardiogram showed normal LVEF=50-55%, severe aortic stenosis. Most recent echo March 2017 with LVEF=60-65%, severe aortic stenosis with thickened calcified leaflets, AVA 0.68 cm2, mean gradient 51 mm Hg, peak gradient 75 mmHg. She was diagnosed with Pulmonary emboli in March 2017 and has been on Eliquis. Cardiac cath 2009 with no evidence of CAD.   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 She is a widow and lives by herself but her daughter is staying with her. She does not cook or perform household chores. She walks with a cane. She denies chest pain, dyspnea, dizziness, near syncope or syncope. She tells me today that she feels great. She has no weakness.   Primary Care Physician: Allean FoundSMITH,CANDACE THIELE, MD  Primary Cardiologist: Dr. Mayford Knifeurner   Past Medical History:  Diagnosis Date  . Allergic rhinitis   . Aortic valve disorders   . Arthritis   . Dyslipidemia   . Glaucoma   . Hypercholesteremia   . Hypertension   . Obesity   . Vertigo     Past Surgical History:  Procedure Laterality Date  . APPENDECTOMY    . bilataral cataracts removal with lens replacement Dr. Darel HongBeavis    . CARDIAC CATHETERIZATION     normal coronary arteries  . SHOULDER SURGERY       Current Outpatient Prescriptions  Medication Sig Dispense Refill  . acetaminophen (TYLENOL) 325 MG tablet Take 650 mg by mouth 2 (two) times daily.     [name]. apixaban (ELIQUIS) 5 MG TABS tablet Take 5 mg by mouth 2 (two) times daily.    [name]. ezetimibe-simvastatin (VYTORIN) 10-20 MG per tablet Take 1 tablet by mouth daily.     . furosemide (LASIX) 20 MG tablet Take 1 tablet by mouth 2 (two) times daily.  2  . latanoprost (XALATAN) 0.005 % ophthalmic solution Place 1 drop into both eyes at bedtime.    . metoprolol succinate (TOPROL-XL) 25 MG 24 hr tablet Take 1 tablet (25 mg total) by mouth daily. 30 tablet 6  . polyethylene glycol (MIRALAX / GLYCOLAX) packet Take 17 g by mouth as directed.      No current facility-administered medications for this visit.     No Known Allergies  Social History   Social History  . Marital status: Widowed    Spouse name: N/A  . Number of children: 2  . Years of education: N/A   Occupational History  . Worked for city [employer]    Social History Main Topics  . Smoking status: Never Smoker  . Smokeless tobacco: Never Used  . Alcohol use No  . Drug use: No  . Sexual activity: No   Other Topics Concern  . Not on file   Social History Narrative  . No narrative on file  Family History  Problem Relation Age of Onset  . Heart disease Sister   . Stroke Neg Hx   . CAD Neg Hx   . Cancer Neg Hx     Review of Systems:  As stated in the HPI and otherwise negative.   BP 130/60   Pulse 76   Ht 5\' 2"  (1.575 m)   Wt 172 lb (78 kg)   BMI 31.46 kg/m   Physical Examination: General: Well developed, well nourished, NAD  HEENT: OP clear, mucus membranes moist  SKIN: warm, dry. No rashes. Neuro: No focal deficits  Musculoskeletal: Muscle strength 5/5 all ext  Psychiatric: Mood and affect normal  Neck: No JVD, no carotid bruits, no thyromegaly, no lymphadenopathy.  Lungs:Clear bilaterally, no wheezes, rhonci, crackles Cardiovascular:  Regular rate and rhythm. Loud harsh systolic murmur. No gallops or rubs. Abdomen:Soft. Bowel sounds present. Non-tender.  Extremities: Trace bilateral lower extremity edema.   Echo March 2017:  Left ventricle: The cavity size was normal. Wall thickness was   normal. Systolic function was normal. The estimated ejection   fraction was in the range of 60% to 65%. Wall motion was normal;   there were no regional wall motion abnormalities. Doppler   parameters are consistent with abnormal left ventricular   relaxation (grade 1 diastolic dysfunction). - Aortic valve:   Mildly calcified annulus.  Doppler:   There was severe stenosis.   There was mild to moderate regurgitation.    VTI ratio of LVOT to aortic valve: 0.36. Valve area (VTI): 0.72 cm^2. Indexed valve area (VTI): 0.39 cm^2/m^2. Peak velocity ratio of LVOT to aortic valve: 0.34. Valve area (Vmax): 0.68 cm^2. Indexed valve area (Vmax): 0.37 cm^2/m^2. Mean velocity ratio of LVOT to aortic valve: 0.33. Valve area (Vmean): 0.67 cm^2. Indexed valve area (Vmean): 0.37 cm^2/m^2.    Mean gradient (S): 51 mm Hg. Peak gradient (S): 75 mm Hg. - Pulmonary arteries: Systolic pressure was mildly increased. PA   peak pressure: 35 mm Hg (S).  EKG:  EKG is not ordered today. The ekg ordered today demonstrates   Recent Labs: 12/17/2015: B Natriuretic Peptide 86.1 12/18/2015: ALT 12; Magnesium 1.6; TSH 1.259 01/01/2016: BUN 21; Creatinine 0.4; Hemoglobin 9.9; Platelets 520; Potassium 4.5; Sodium 140     Wt Readings from Last 3 Encounters:  07/04/16 172 lb (78 kg)  06/05/16 168 lb 1.9 oz (76.3 kg)  01/22/16 165 lb (74.8 kg)     Other studies Reviewed: Additional studies/ records that were reviewed today include: Echo images.  Review of the above records demonstrates:   Assessment and Plan:   1. Severe aortic valve stenosis: She has severe aortic valve stenosis based on her echo findings but at this time she is asymptomatic. She is inactive  and I suspect she is minimizing her symptoms. She is here with her daughter who confirms the fact that she is doing well. She has had no chest pain, dyspnea, dizziness, near syncope or syncope. I have personally reviewed her echo images today from March 2017. The images are poor but te aortic valve leaflets appear to be thickened and calcified with limited leaflet mobility. The gradients are increased across the valve and the estimated valve area is around 0.7cm2. I think she would benefit from AVR. Given advanced age, she is not a good candidate for conventional AVR by surgical approach. I think she may be a good candidate for TAVR. She is a reasonably functional 82 yo patient. I have reviewed the TAVR procedure in  detail today with the patient and her family. She would like to hold off on TAVR for now and would prefer to see me back in 3-4 months to discuss further. I will plan a repeat echo in January 2018 and will see her after that to discuss TAVR further. I would anticipate that we will move forward with TAVR planning when I see her back if she is agreeable. She will call if she has any change in symptoms over the next 4 months.   Current medicines are reviewed at length with the patient today.  The patient does not have concerns regarding medicines.  The following changes have been made:  no change  Labs/ tests ordered today include:   Orders Placed This Encounter  Procedures  . ECHOCARDIOGRAM COMPLETE    Disposition:   FU with me in 4 months  Signed, Verne Carrow, MD 07/04/2016 12:37 PM    [facility] [address] [place], [place], [place]  [address] Phone: [phone]; Fax: [phone]

## 2016-07-04 NOTE — Patient Instructions (Signed)
Medication Instructions:  Your physician recommends that you continue on your current medications as directed. Please refer to the Current Medication list given to you today.   Labwork: none  Testing/Procedures: Your physician has requested that you have an echocardiogram. Echocardiography is a painless test that uses sound waves to create images of your heart. It provides your doctor with information about the size and shape of your heart and how well your heart's chambers and valves are working. This procedure takes approximately one hour. There are no restrictions for this procedure. To be done in January 2018    Follow-Up: Your physician wants you to follow-up in mid to late January 2018 You will receive a reminder letter in the mail two months in advance. If you don't receive a letter, please call our office to schedule the follow-up appointment.   Any Other Special Instructions Will Be Listed Below (If Applicable).     If you need a refill on your cardiac medications before your next appointment, please call your pharmacy.

## 2016-07-08 ENCOUNTER — Encounter: Payer: Self-pay | Admitting: Sports Medicine

## 2016-07-08 ENCOUNTER — Ambulatory Visit (INDEPENDENT_AMBULATORY_CARE_PROVIDER_SITE_OTHER): Payer: Medicare Other | Admitting: Sports Medicine

## 2016-07-08 VITALS — Resp 16 | Ht 62.0 in | Wt 168.0 lb

## 2016-07-08 DIAGNOSIS — M2040 Other hammer toe(s) (acquired), unspecified foot: Secondary | ICD-10-CM | POA: Diagnosis not present

## 2016-07-08 DIAGNOSIS — I739 Peripheral vascular disease, unspecified: Secondary | ICD-10-CM

## 2016-07-08 DIAGNOSIS — M79676 Pain in unspecified toe(s): Secondary | ICD-10-CM | POA: Diagnosis not present

## 2016-07-08 DIAGNOSIS — B351 Tinea unguium: Secondary | ICD-10-CM | POA: Diagnosis not present

## 2016-07-08 NOTE — Progress Notes (Signed)
Subjective: [name] is a 82 y.o. female patient seen today in office with complaint of painful thickened and elongated toenails; unable to trim. Patient denies history of Diabetes or Neuropathy. Has a history Vascular disease abd is on Eliquis for history of blood clots. Patient has no other pedal complaints at this time.   Patient Active Problem List   Diagnosis Date Noted  . Lower leg DVT (deep venous thrombosis), right 12/26/2015  . Generalized weakness 12/26/2015  . Pulmonary embolism without acute cor pulmonale (HCC) 12/26/2015  . Humerus fracture 12/18/2015  . Pulmonary embolism (HCC) 12/18/2015  . Acute encephalopathy   . Fracture of humerus, left, closed 12/17/2015  . SAH (subarachnoid hemorrhage) (HCC) 11/16/2015  . Diastolic dysfunction-grade 2 11/16/2015  . Wrist fracture 11/14/2015  . Fall 11/14/2015  . Essential hypertension   . Hypercholesteremia   . Aortic stenosis, severe   . Allergic rhinitis   . Arthritis   . Dyslipidemia   . Obesity     Current Outpatient Prescriptions on File Prior to Visit  Medication Sig Dispense Refill  . acetaminophen (TYLENOL) 325 MG tablet Take 650 mg by mouth 2 (two) times daily.     [name]. apixaban (ELIQUIS) 5 MG TABS tablet Take 5 mg by mouth 2 (two) times daily.    [name]. ezetimibe-simvastatin (VYTORIN) 10-20 MG per tablet Take 1 tablet by mouth daily.     . furosemide (LASIX) 20 MG tablet Take 1 tablet by mouth 2 (two) times daily.  2  . latanoprost (XALATAN) 0.005 % ophthalmic solution Place 1 drop into both eyes at bedtime.    . metoprolol succinate (TOPROL-XL) 25 MG 24 hr tablet Take 1 tablet (25 mg total) by mouth daily. 30 tablet 6  . polyethylene glycol (MIRALAX / GLYCOLAX) packet Take 17 g by mouth as directed.      No current facility-administered medications on file prior to visit.     No Known Allergies  Objective: Physical Exam  General: Well developed, nourished, no acute distress, awake, alert and oriented x  3  Vascular: Dorsalis pedis artery 1/4 bilateral, Posterior tibial artery 0/4 bilateral due to 1+ pitting edema, venous stasis skin changes, skin temperature warm to warm proximal to distal bilateral lower extremities, + varicosities, - pedal hair present bilateral.  Neurological: Gross sensation present via light touch bilateral.   Dermatological: Skin is warm, dry, and supple bilateral, Nails 1-10 are tender, long, thick, and discolored with mild subungal debris, no webspace macerations present bilateral, no open lesions present bilateral, no callus/corns/hyperkeratotic tissue present bilateral. No signs of infection bilateral.  Musculoskeletal:Asymptomatic hammertoe boney deformities noted bilateral. Muscular strength within normal limits without painon range of motion. No pain with calf compression bilateral.  Assessment and Plan:  Problem List Items Addressed This Visit    None    Visit Diagnoses    Pain due to onychomycosis of toenail    -  Primary   PVD (peripheral vascular disease) (HCC)       Hammertoe, unspecified laterality         -Examined patient.  -Discussed treatment options for painful mycotic nails. -Mechanically debrided and reduced mycotic nails with sterile nail nipper and dremel nail file without incident. -Recommend elevation of legs, diet control, and continue with Lasix for edema control  -Recommend good supportive shoes for foot type -Patient to return in 3 months for follow up evaluation or sooner if symptoms worsen.  Asencion Islamitorya Sharene Krikorian, DPM

## 2016-07-17 ENCOUNTER — Telehealth: Payer: Self-pay | Admitting: Neurology

## 2016-07-17 ENCOUNTER — Ambulatory Visit (INDEPENDENT_AMBULATORY_CARE_PROVIDER_SITE_OTHER): Payer: Medicare Other | Admitting: Neurology

## 2016-07-17 ENCOUNTER — Encounter: Payer: Self-pay | Admitting: Neurology

## 2016-07-17 VITALS — BP 130/82 | HR 68 | Resp 20 | Ht 62.0 in | Wt 176.0 lb

## 2016-07-17 DIAGNOSIS — S066X0A Traumatic subarachnoid hemorrhage without loss of consciousness, initial encounter: Secondary | ICD-10-CM

## 2016-07-17 DIAGNOSIS — F0150 Vascular dementia without behavioral disturbance: Secondary | ICD-10-CM

## 2016-07-17 MED ORDER — MEMANTINE HCL 10 MG PO TABS: 10.0000 mg | ORAL_TABLET | Freq: Two times a day (BID) | ORAL | 3 refills | Status: DC

## 2016-07-17 MED ORDER — MEMANTINE HCL 28 X 5 MG & 21 X 10 MG PO TABS: ORAL_TABLET | ORAL | 12 refills | Status: DC

## 2016-07-17 NOTE — Patient Instructions (Signed)
Memantine Tablets What is this medicine? MEMANTINE (MEM an teen) is used to treat dementia caused by Alzheimer's disease. This medicine may be used for other purposes; ask your health care provider or pharmacist if you have questions. What should I tell my health care provider before I take this medicine? They need to know if you have any of these conditions: -difficulty passing urine -kidney disease -liver disease -seizures -an unusual or allergic reaction to memantine, other medicines, foods, dyes, or preservatives -pregnant or trying to get pregnant -breast-feeding How should I use this medicine? Take this medicine by mouth with a glass of water. Follow the directions on the prescription label. You may take this medicine with or without food. Take your doses at regular intervals. Do not take your medicine more often than directed. Continue to take your medicine even if you feel better. Do not stop taking except on the advice of your doctor or health care professional. Talk to your pediatrician regarding the use of this medicine in children. Special care may be needed. Overdosage: If you think you have taken too much of this medicine contact a poison control center or emergency room at once. NOTE: This medicine is only for you. Do not share this medicine with others. What if I miss a dose? If you miss a dose, take it as soon as you can. If it is almost time for your next dose, take only that dose. Do not take double or extra doses. If you do not take your medicine for several days, contact your health care provider. Your dose may need to be changed. What may interact with this medicine? -acetazolamide -amantadine -cimetidine -dextromethorphan -dofetilide -hydrochlorothiazide -ketamine -metformin -methazolamide -quinidine -ranitidine -sodium bicarbonate -triamterene This list may not describe all possible interactions. Give your health care provider a list of all the medicines,  herbs, non-prescription drugs, or dietary supplements you use. Also tell them if you smoke, drink alcohol, or use illegal drugs. Some items may interact with your medicine. What should I watch for while using this medicine? Visit your doctor or health care professional for regular checks on your progress. Check with your doctor or health care professional if there is no improvement in your symptoms or if they get worse. You may get drowsy or dizzy. Do not drive, use machinery, or do anything that needs mental alertness until you know how this drug affects you. Do not stand or sit up quickly, especially if you are an older patient. This reduces the risk of dizzy or fainting spells. Alcohol can make you more drowsy and dizzy. Avoid alcoholic drinks. What side effects may I notice from receiving this medicine? Side effects that you should report to your doctor or health care professional as soon as possible: -allergic reactions like skin rash, itching or hives, swelling of the face, lips, or tongue -agitation or a feeling of restlessness -depressed mood -dizziness -hallucinations -redness, blistering, peeling or loosening of the skin, including inside the mouth -seizures -vomiting Side effects that usually do not require medical attention (report to your doctor or health care professional if they continue or are bothersome): -constipation -diarrhea -headache -nausea -trouble sleeping This list may not describe all possible side effects. Call your doctor for medical advice about side effects. You may report side effects to FDA at 1-800-FDA-1088. Where should I keep my medicine? Keep out of the reach of children. Store at room temperature between 15 degrees and 30 degrees C (59 degrees and 86 degrees F). Throw away any   unused medicine after the expiration date. NOTE: This sheet is a summary. It may not cover all possible information. If you have questions about this medicine, talk to your doctor,  pharmacist, or health care provider.    2016, Elsevier/Gold Standard. (2013-07-25 14:10:42)  

## 2016-07-17 NOTE — Telephone Encounter (Signed)
[name] [phone] called said the pt's insurance won't cover memantine (NAMENDA TITRATION PACK) tablet pack  so she is going to fill RX with 5mg  tablets with same directions

## 2016-07-17 NOTE — Progress Notes (Addendum)
NEUROLOGY CLINIC   Provider:  Melvyn Novasarmen  Kimyata Kelley, M D  Referring Provider: Merri BrunetteSmith, Candace, MD Primary Care Physician:  Karen FoundSMITH,Karen THIELE, MD  Chief Complaint  Patient presents with  . New Patient (Initial Visit)    had a fall hit head, memory problems    HPI:  [name] is a 82 y.o. female , who will celebrate her [age] th Birthday tomorrow,  seen here as a referral  from Dr. Katrinka BlazingSmith for memory problems.   Chief complaint according to patient : " I fell in January " .  Mrs. Karen PlumeHaynes has been seen in my clinic but are not quite sure when I saw over the last time 5 or 6 years ago. She has lived independently until this year when after a fall she had to go through a prolonged [facility] at [facility] and at [facility]. She is now being helped by her daughter's and resides [address] In her private home, again. In the past I have seen her for chronic vertigo balance problems.07/17/16 Her visit is a slight bit different, Dr. Katrinka BlazingSmith described that the patient suffered a subarachnoid hemorrhage after a fall that she has also hypertensive heart disease without heart failure, pulmonary hypertension aortic valve insufficiency, joint pain and osteoporosis. She was chronically anticoagulated and she suffers from glaucoma. In 01/ 2017 she suffered a broken wrist. Her daughter is unclear about the TBI with falls, a CT from feb obtained in the ED showed no bleed- see below.   I was able to see the most recent lab results for this patient from July 2017 obtained through [facility]. Anion gap was high at 23.3, but there was normal sodium normal potassium and normal chloride, normal calcium protein and albumin normal liver function enzymes normal renal clearance, and a slightly elevated glucose level but the sample may not have been obtained 55. She had an excellent lipid panel, CBC with differential and urinalysis. Her vitamin D level was on the lower side, 24.5.    She  feels that the may be some minor impairment of cognition since the fall and subarachnoid hemorrhage but she certainly does deny to struggle with dementia.  MMSE - Mini Mental State Exam 07/17/2016  Orientation to time 4  Orientation to Place 3  Registration 1  Attention/ Calculation 2  Recall 0  Language- name 2 objects 2  Language- repeat 0  Language- follow 3 step command 3  Language- read & follow direction 1  Write a sentence 1  Copy design 0  Total score 17     Review of Systems: Out of a complete 14 system review, the patient complains of only the following symptoms, and all other reviewed systems are negative. Memory loss.  She and her daughter report that she likes to read that she reads the paper daily from cover to cover that she is oriented to the date, she could also give me her address and phone number. She reminded me that tomorrow is her birthday.    Social History   Social History  . Marital status: Widowed    Spouse name: N/A  . Number of children: 2  . Years of education: N/A   Occupational History  . Worked for [employer]    Social History Main Topics  . Smoking status: Never Smoker  . Smokeless tobacco: Never Used  . Alcohol use No  . Drug use: No  . Sexual activity: No   Other Topics Concern  .  Not on file   Social History Narrative  . No narrative on file    Family History  Problem Relation Age of Onset  . Heart disease Sister   . Stroke Neg Hx   . CAD Neg Hx   . Cancer Neg Hx     Past Medical History:  Diagnosis Date  . Allergic rhinitis   . Aortic valve disorders   . Arthritis   . Dyslipidemia   . Glaucoma   . Hypercholesteremia   . Hypertension   . Obesity   . Vertigo     Past Surgical History:  Procedure Laterality Date  . APPENDECTOMY    . bilataral cataracts removal with lens replacement [name]    . CARDIAC CATHETERIZATION     normal coronary arteries  . SHOULDER SURGERY      Current Outpatient  Prescriptions  Medication Sig Dispense Refill  . acetaminophen (TYLENOL) 325 MG tablet Take 650 mg by mouth 2 (two) times daily.     [name] apixaban (ELIQUIS) 5 MG TABS tablet Take 5 mg by mouth 2 (two) times daily.    [name] ezetimibe-simvastatin (VYTORIN) 10-20 MG per tablet Take 1 tablet by mouth daily.     . furosemide (LASIX) 20 MG tablet Take 1 tablet by mouth 2 (two) times daily.  2  . latanoprost (XALATAN) 0.005 % ophthalmic solution Place 1 drop into both eyes at bedtime.    . metoprolol succinate (TOPROL-XL) 25 MG 24 hr tablet Take 1 tablet (25 mg total) by mouth daily. 30 tablet 6  . polyethylene glycol (MIRALAX / GLYCOLAX) packet Take 17 g by mouth as directed.      No current facility-administered medications for this visit.     Allergies as of 07/17/2016  . (No Known Allergies)   CLINICAL DATA:  82 year old female with recent fall lab presenting with shortness of breath and bilateral lower extremity swelling. Clinical concern for acute encephalopathy.  EXAM: CT HEAD WITHOUT CONTRAST  TECHNIQUE: Contiguous axial images were obtained from the base of the skull through the vertex without intravenous contrast.  COMPARISON:  CT dated 11/21/2015  FINDINGS: There is mild prominence of the ventricles and sulci compatible with age-related atrophy. Moderate periventricular and deep white matter chronic microvascular ischemic changes noted. There is no acute intracranial hemorrhage. No mass effect or midline shift.  The visualized paranasal sinuses and mastoid air cells are clear. The calvarium is intact.  IMPRESSION: No acute intracranial hemorrhage.  Age-related atrophy and chronic microvascular ischemic disease.  If symptoms persist and there are no contraindications, MRI may provide better evaluation if clinically indicated.   Electronically Signed   By: [name] M.D.   On: 12/17/2015 21:13  Vitals: BP 130/82   Pulse 68   Resp 20   Ht 5\' 2"  (1.575  m)   Wt 176 lb (79.8 kg)   BMI 32.19 kg/m  Last Weight:  Wt Readings from Last 1 Encounters:  07/17/16 176 lb (79.8 kg)   ZOX:WRUE mass index is 32.19 kg/m.     Last Height:   Ht Readings from Last 1 Encounters:  07/17/16 5\' 2"  (1.575 m)    Physical exam:  General: The patient is awake, alert and appears not in acute distress. The patient is well groomed. Head: Normocephalic, atraumatic. Neck is supple.   Cardiovascular:  Regular rate and rhythm , with  murmurs - without distended neck veins. Respiratory: Lungs are clear to auscultation. Skin:  Ankle  edema, bi- lateral Trunk: BMI  is elevated. The patient's posture ; she seems slightly to lean to the left  Neurologic exam : The patient is awake and alert, oriented to place and time.   Memory subjective  described as" normal for my age "  MOCA:No flowsheet data Kelley. MMSE: MMSE - Mini Mental State Exam 07/17/2016  Orientation to time 4  Orientation to Place 3  Registration 1  Attention/ Calculation 2  Recall 0  Language- name 2 objects 2  Language- repeat 0  Language- follow 3 step command 3  Language- read & follow direction 1  Write a sentence 1  Copy design 0  Total score 17       Attention span & concentration ability appears normal.  Speech is fluent,  without   dysarthria, dysphonia but not  aphasia.  Mood and affect are appropriate.  Cranial nerves: Pupils are equal and briskly reactive to light.  Extraocular movements in vertical and horizontal planes intact and without nystagmus. Visual fields by finger perimetry are intact.  Hearing to finger rub intact. Facial sensation intact to fine touch.Facial motor strength is symmetric and tongue and uvula move midline. Shoulder shrug was symmetrical.   Motor exam:   Normal tone, muscle bulk and symmetric strength in all extremities. Sensory:  Fine touch, pinprick and vibration were tested in all extremities.  Proprioception tested in the upper extremities was  normal.  Coordination: Rapid alternating movements in the fingers/hands was slowed, her hands are not limber, she broke both wrists. . Finger-to-nose maneuver  normal without evidence of ataxia, dysmetria or tremor.  Gait and station: Patient walks with single point cane as assistive device and is able to rise without the assistance of another person. She did lean and brace herself with her right arm only. Deep tendon reflexes: in the upper and lower extremities are symmetric and intact.    The patient was advised of the nature of the diagnosed disorder , the treatment options and risks for general a health and wellness arising from not treating the condition.  Multifactorial gait disorder, cognitive impairment but well oriented to time and place. Participate in the discussion and is able to answer my  questions.  I spent more than 45 minutes of face to face time with the patient. Greater than 50% of time was spent in counseling and coordination of care. We have discussed the diagnosis and differential and I answered the patient's questions.     Assessment:  After physical and neurologic examination, review of laboratory studies,  Personal review of imaging studies, reports of other /same  Imaging studies ,  Results of polysomnography/ neurophysiology testing and pre-existing records as far as provided in visit., my assessment is   1) Mrs. Paske appears much healthier than many in her peer group. She has had a gait impairment and balance problem for many many years and while I saw her last without a cane she is now relying on a cane in her left hand. She was able to rise from a seated position by bracing her right arm that was the only assistance she needed. She has a slightly more hunched posture which is age related and she has developed a neck hump is seen in osteoporotic patients. Overall neither the patient nor her daughter are concerned that she is not safe to live at home or stay at home. Her  memory is described by family and herself as age appropriate. She has not been as well and  quick since her fall caused  a subarachnoid hemorrhage, but she has completed rehabilitation and the following nursing home stay was physical therapy and I think she has reached maximum benefit.  She may still require OT, but I will leave this to the therapists recommendations.   Plan:  Treatment plan and additional workup :  I agree that there is a cognitive impairment but I do not think that the Mini-Mental test does reflect this correctly. I suggested pupils change to a [place] cognitive assessment when she is next time visiting [facility]. Her short-term memory on today's test was poor but she seemed to have more problems with repetition and was actually naming or finding words. She follow multistep commands without difficulty. She fractured both hands and her ability to draw and write have been affected but this is not cognitively reflected. She was able to draw a clock face and she could write a simple sentence.  RV with Np in 4- 6 month.  The patient will undergo repeated memory testing.   Porfirio Mylar Adir Schicker MD  07/17/2016   CC: Karen Brunette, Md [address] [place], [place] [address]

## 2016-07-17 NOTE — Telephone Encounter (Signed)
I spoke to Dr. Vickey Hugerohmeier. She is ok with the namenda 5mg  tablets and the directions for the titration. [facility] and advised her of this information. She verbalized understanding.

## 2016-08-06 ENCOUNTER — Telehealth: Payer: Self-pay | Admitting: *Deleted

## 2016-08-06 NOTE — Telephone Encounter (Signed)
Pt is due for follow up with Dr. Clifton JamesMcAlhany in January after echo. I placed call to pt to schedule this appointment and left message to call back

## 2016-08-14 NOTE — Telephone Encounter (Signed)
Left message on daughters phone to call office

## 2016-08-19 NOTE — Telephone Encounter (Signed)
Left message for pt's daughter [name] to call office. I called pt's home number and spoke with her daughter [name]. [name] is unable to schedule January appt with Dr. Clifton JamesMcAlhany at this time but will have Junious Dresseronnie call us back. [name] reports pt has had trouble with fluttering,anxiety and facial redness recently. They are wondering if she needs sooner office visit with Dr. Mayford Knifeurner.  [name] prefers we discuss this with [name] and will have her call us tomorrow.

## 2016-09-15 ENCOUNTER — Ambulatory Visit: Payer: Medicare Other | Admitting: Cardiology

## 2016-09-18 NOTE — Telephone Encounter (Signed)
Left message for pt's daughter [name](Connie) to call office.

## 2016-09-29 ENCOUNTER — Ambulatory Visit: Payer: Medicare Other | Admitting: Physician Assistant

## 2016-09-29 NOTE — Telephone Encounter (Signed)
Left message for pt's daughter to call office.

## 2016-10-07 ENCOUNTER — Ambulatory Visit: Payer: Medicare Other | Admitting: Sports Medicine

## 2016-10-23 ENCOUNTER — Ambulatory Visit: Payer: Medicare Other | Admitting: Adult Health

## 2016-10-27 ENCOUNTER — Encounter: Payer: Self-pay | Admitting: *Deleted

## 2016-10-27 ENCOUNTER — Other Ambulatory Visit (HOSPITAL_COMMUNITY): Payer: Medicare Other

## 2016-10-27 NOTE — Telephone Encounter (Signed)
Pt cancelled appt for echo today and also cancelled appt with Jacolyn ReedyMichele Lenze, PA scheduled for tomorrow.   Letter sent to pt asking her to contact office to schedule echo appt and appt with Dr. Clifton JamesMcAlhany.

## 2016-10-28 ENCOUNTER — Ambulatory Visit: Payer: Medicare Other | Admitting: Physician Assistant

## 2016-11-12 ENCOUNTER — Ambulatory Visit: Payer: Medicare Other | Admitting: Adult Health

## 2016-11-20 ENCOUNTER — Telehealth (HOSPITAL_COMMUNITY): Payer: Self-pay | Admitting: Radiology

## 2016-11-20 NOTE — Telephone Encounter (Signed)
LMOM to call office to schedule an appointment

## 2016-12-15 ENCOUNTER — Other Ambulatory Visit: Payer: Self-pay | Admitting: Family Medicine

## 2016-12-15 ENCOUNTER — Ambulatory Visit
Admission: RE | Admit: 2016-12-15 | Discharge: 2016-12-15 | Disposition: A | Discharge status: Home / Self Care | Payer: Medicare Other | Source: Ambulatory Visit | Attending: Family Medicine | Admitting: Family Medicine

## 2016-12-15 DIAGNOSIS — M25432 Effusion, left wrist: Secondary | ICD-10-CM

## 2016-12-24 ENCOUNTER — Ambulatory Visit: Payer: Medicare Other | Admitting: Adult Health

## 2016-12-30 ENCOUNTER — Ambulatory Visit: Payer: Medicare Other | Admitting: Sports Medicine

## 2017-01-08 ENCOUNTER — Ambulatory Visit: Payer: Medicare Other | Admitting: Adult Health

## 2017-01-15 ENCOUNTER — Telehealth (HOSPITAL_COMMUNITY): Payer: Self-pay | Admitting: Cardiovascular Disease

## 2017-01-19 NOTE — Telephone Encounter (Signed)
  01/15/2017 11:21 AM Phone (Outgoing) Arine, [place] (Self) [phone] (M)   Left Message - Called pt and lmsg for her to CB to r/s echo.    By Elita Boone

## 2017-03-05 ENCOUNTER — Ambulatory Visit: Payer: Self-pay | Admitting: Adult Health

## 2017-03-09 ENCOUNTER — Encounter: Payer: Self-pay | Admitting: Adult Health

## 2017-04-13 ENCOUNTER — Telehealth: Payer: Self-pay | Admitting: Sports Medicine

## 2017-04-13 NOTE — Telephone Encounter (Signed)
Per vm from pts daughter (1142am)she needs us to call her back to schedule an appt.  I returned call and lvm for pts daughter to callus back

## 2017-04-21 ENCOUNTER — Encounter: Payer: Self-pay | Admitting: Adult Health

## 2017-04-21 NOTE — Progress Notes (Signed)
Daughter called and cancelled same day appt stating 1st no show was because of weather, 2nd was because pt was ill and today (3rd) was due to an unexpected family emergency.

## 2017-05-12 NOTE — Progress Notes (Signed)
This encounter was created in error - please disregard.

## 2017-05-19 ENCOUNTER — Encounter: Payer: Self-pay | Admitting: Sports Medicine

## 2017-05-19 ENCOUNTER — Ambulatory Visit (INDEPENDENT_AMBULATORY_CARE_PROVIDER_SITE_OTHER): Payer: Medicare Other | Admitting: Sports Medicine

## 2017-05-19 DIAGNOSIS — I739 Peripheral vascular disease, unspecified: Secondary | ICD-10-CM

## 2017-05-19 DIAGNOSIS — M79676 Pain in unspecified toe(s): Secondary | ICD-10-CM

## 2017-05-19 DIAGNOSIS — B351 Tinea unguium: Secondary | ICD-10-CM | POA: Diagnosis not present

## 2017-05-19 NOTE — Progress Notes (Signed)
Subjective: [name] is a 83 y.o. female patient seen today in office with complaint of painful thickened and elongated toenails; unable to trim. Patient has no other pedal complaints at this time.   Patient Active Problem List   Diagnosis Date Noted  . Lower leg DVT (deep venous thrombosis), right 12/26/2015  . Generalized weakness 12/26/2015  . Pulmonary embolism without acute cor pulmonale (HCC) 12/26/2015  . Humerus fracture 12/18/2015  . Pulmonary embolism (HCC) 12/18/2015  . Acute encephalopathy   . Fracture of humerus, left, closed 12/17/2015  . SAH (subarachnoid hemorrhage) (HCC) 11/16/2015  . Diastolic dysfunction-grade 2 11/16/2015  . Wrist fracture 11/14/2015  . Fall 11/14/2015  . Essential hypertension   . Hypercholesteremia   . Aortic stenosis, severe   . Allergic rhinitis   . Arthritis   . Dyslipidemia   . Obesity     Current Outpatient Prescriptions on File Prior to Visit  Medication Sig Dispense Refill  . acetaminophen (TYLENOL) 325 MG tablet Take 650 mg by mouth 2 (two) times daily.     [name]. apixaban (ELIQUIS) 5 MG TABS tablet Take 5 mg by mouth 2 (two) times daily.    [name]. ezetimibe-simvastatin (VYTORIN) 10-20 MG per tablet Take 1 tablet by mouth daily.     . furosemide (LASIX) 20 MG tablet Take 1 tablet by mouth 2 (two) times daily.  2  . latanoprost (XALATAN) 0.005 % ophthalmic solution Place 1 drop into both eyes at bedtime.    . memantine (NAMENDA TITRATION PACK) tablet pack 5 mg/day for =1 week; 5 mg twice daily for =1 week; 15 mg/day given in 5 mg and 10 mg separated doses for =1 week; then 10 mg twice daily 49 tablet 12  . memantine (NAMENDA) 10 MG tablet Take 1 tablet (10 mg total) by mouth 2 (two) times daily. 180 tablet 3  . metoprolol succinate (TOPROL-XL) 25 MG 24 hr tablet Take 1 tablet (25 mg total) by mouth daily. 30 tablet 6  . polyethylene glycol (MIRALAX / GLYCOLAX) packet Take 17 g by mouth as directed.      No current facility-administered  medications on file prior to visit.     No Known Allergies  Objective: Physical Exam  General: Well developed, nourished, no acute distress, awake, alert and oriented x 3  Vascular: Dorsalis pedis artery 1/4 bilateral, Posterior tibial artery 0/4 bilateral due to 1+ pitting edema, venous stasis skin changes, skin temperature warm to warm proximal to distal bilateral lower extremities, + varicosities, - pedal hair present bilateral.  Neurological: Gross sensation present via light touch bilateral.   Dermatological: Skin is warm, dry, and supple bilateral, Nails 1-10 are tender, long, thick, and discolored with mild subungal debris, no webspace macerations present bilateral, no open lesions present bilateral, no callus/corns/hyperkeratotic tissue present bilateral. No signs of infection bilateral.  Musculoskeletal:Asymptomatic hammertoe boney deformities noted bilateral. Muscular strength within normal limits without painon range of motion. No pain with calf compression bilateral.  Assessment and Plan:  Problem List Items Addressed This Visit    None    Visit Diagnoses    Pain due to onychomycosis of toenail    -  Primary   PVD (peripheral vascular disease) (HCC)         -Examined patient.  -Discussed treatment options for painful mycotic nails. -Mechanically debrided and reduced mycotic nails with sterile nail nipper and dremel nail file without incident. -Recommend elevation of legs, diet control, and continue with Lasix for edema control  -Recommend  good supportive shoes for foot type -Patient to return in 3 months for follow up evaluation or sooner if symptoms worsen.  Asencion Islamitorya Calah Gershman, DPM

## 2017-06-26 IMAGING — CT CT ANGIO CHEST
2 of 8 series · 18 of 36 positions shown · IV contrast (OMNIPAQUE 350)
Comparison: Chest radiograph December 17, 2015 at 4844 hours

ADDENDUM:
Acute findings discussed with and reconfirmed by Dr.WINDY SENG
on 12/17/2015 at [DATE].
CLINICAL DATA: Recent fall, now with shortness of breath and
bilateral leg swelling. Acute LEFT humerus fracture. History of
hypertension, hyperlipidemia.

EXAM:
CT ANGIOGRAPHY CHEST WITH CONTRAST
TECHNIQUE: Multidetector CT imaging of the chest was performed using the
standard protocol during bolus administration of intravenous
contrast. Multiplanar CT image reconstructions and MIPs were
obtained to evaluate the vascular anatomy.
CONTRAST:  100mL OMNIPAQUE IOHEXOL 350 MG/ML SOLN

[Series 7: thins for pacs · axial · 0.64mm/px · z∈[+1430,+1640]mm · 17 of 237 slices shown]
[im 14/237  lung]
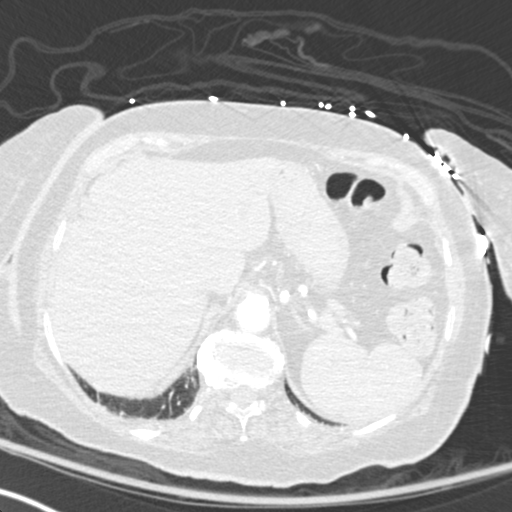
[im 27/237  mediastinal]
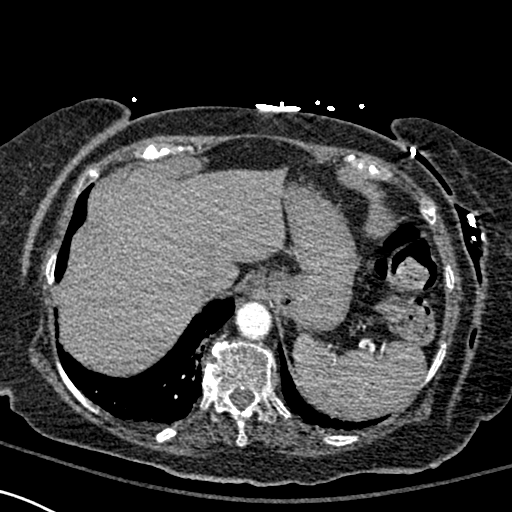
[im 40/237  lung]
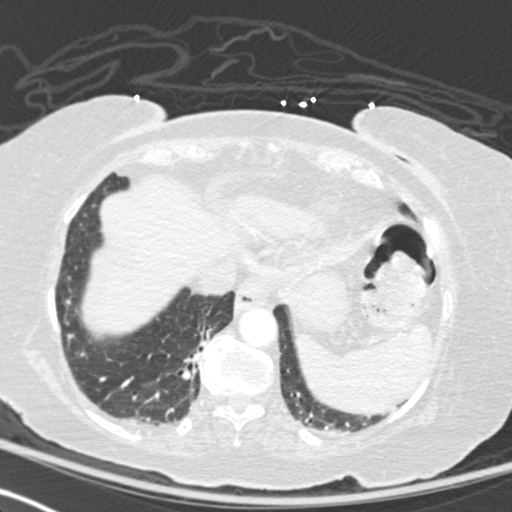
[im 53/237  mediastinal]
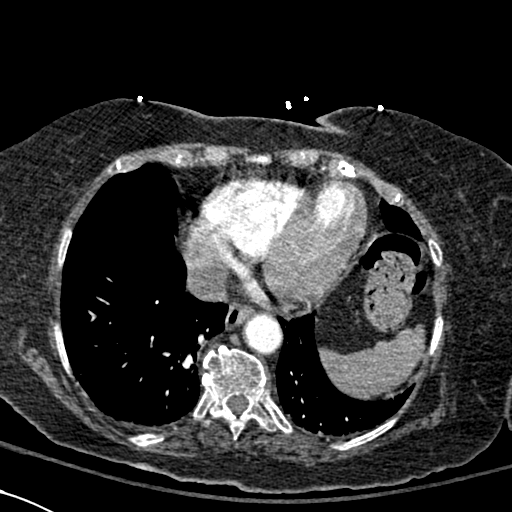
[im 66/237  lung]
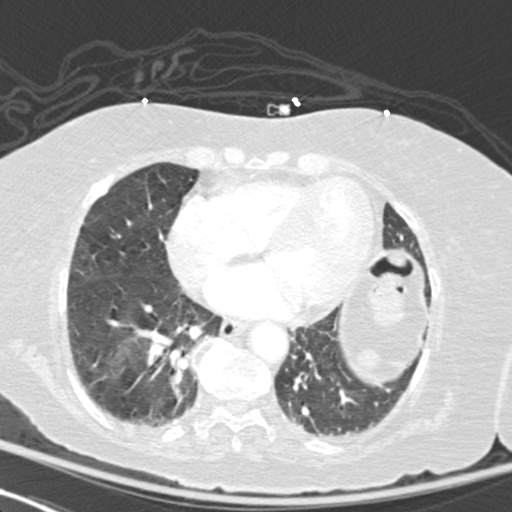
[im 79/237  mediastinal]
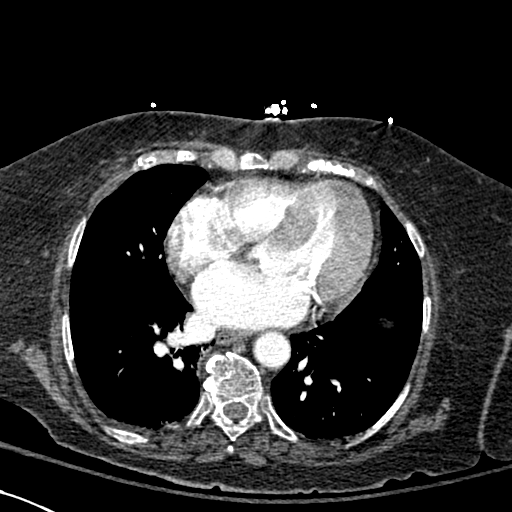
[im 92/237  lung]
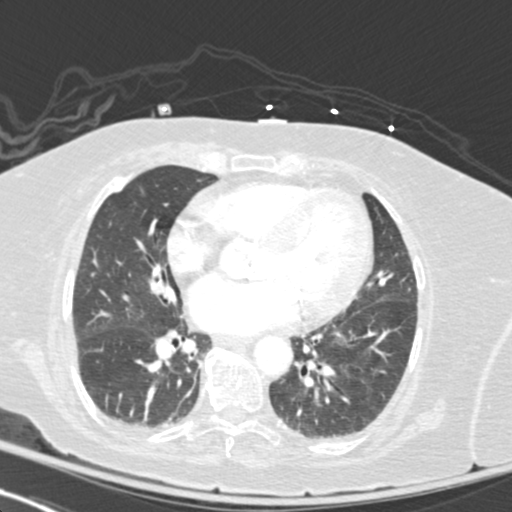
[im 105/237  mediastinal]
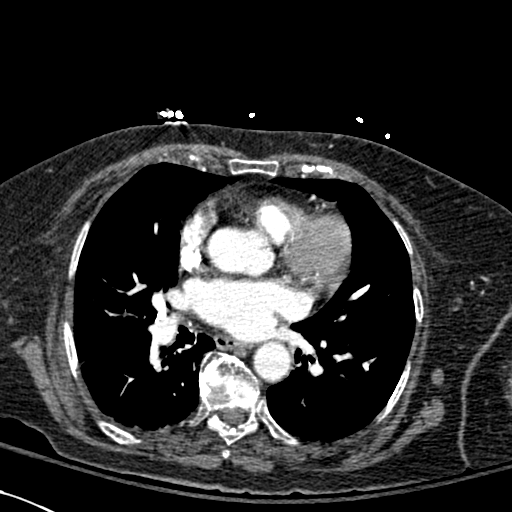
[im 119/237  lung]
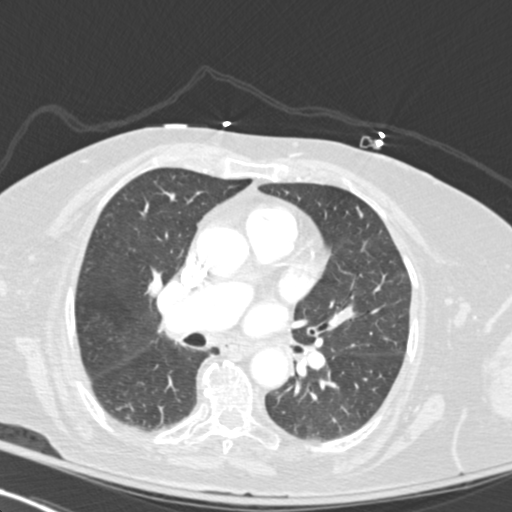
[im 132/237  mediastinal]
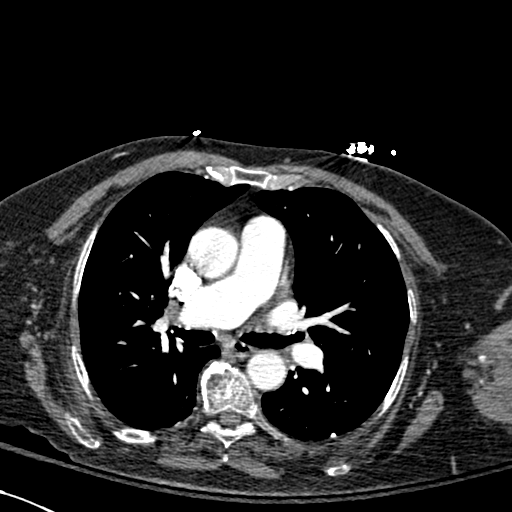
[im 145/237  lung]
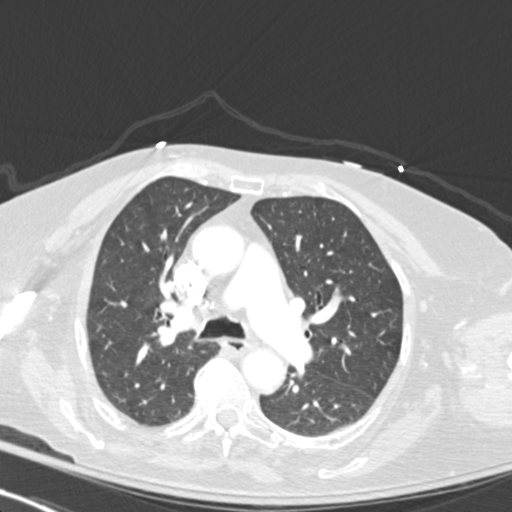
[im 158/237  mediastinal]
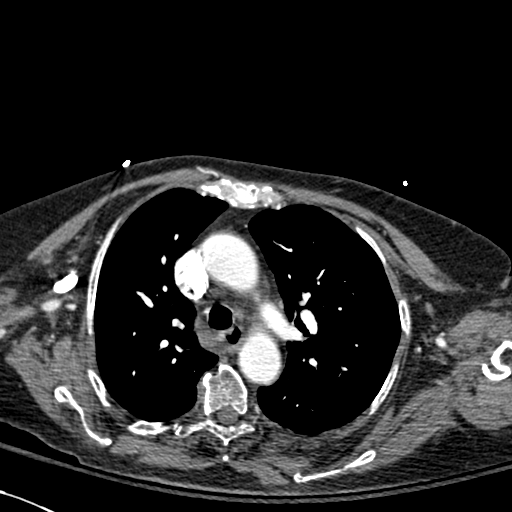
[im 171/237  lung]
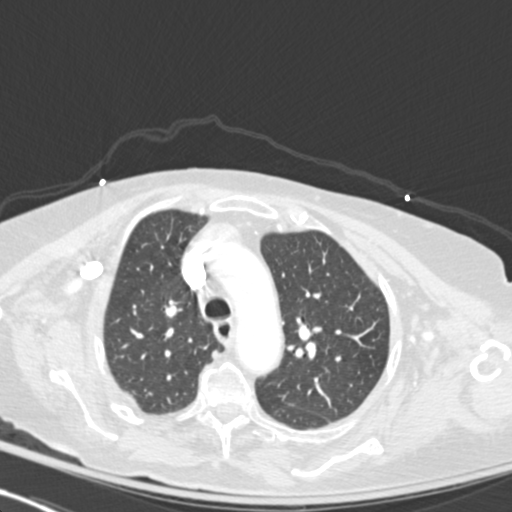
[im 184/237  mediastinal]
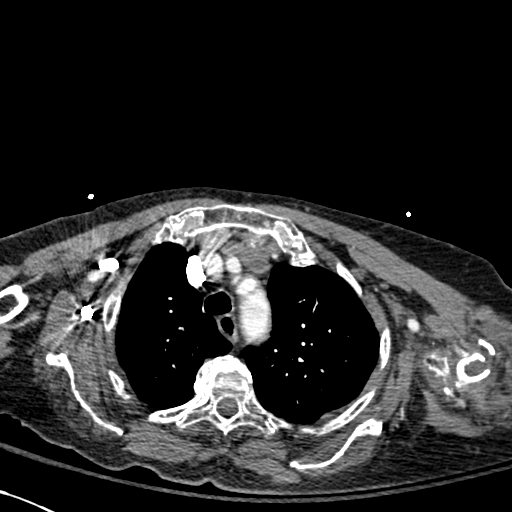
[im 197/237  lung]
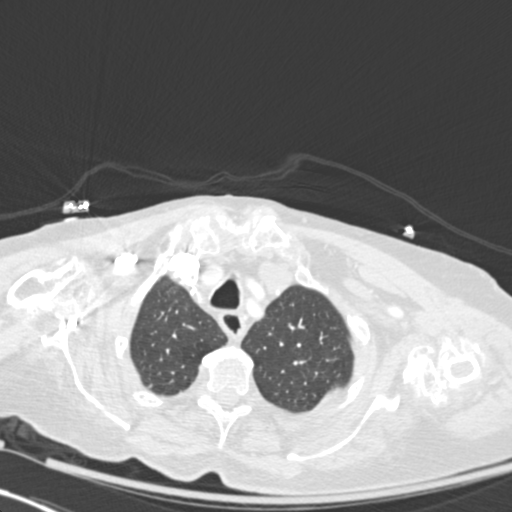
[im 210/237  mediastinal]
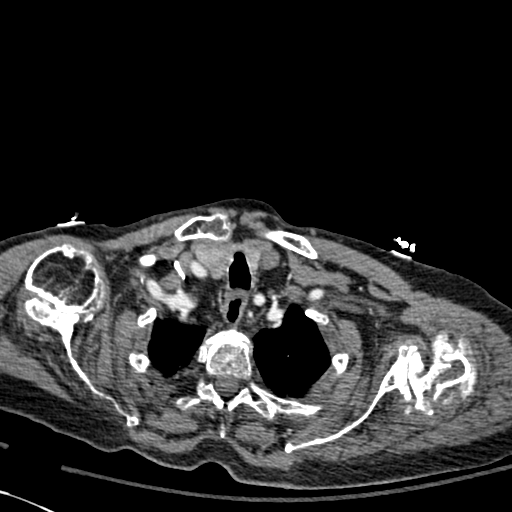
[im 223/237  lung]
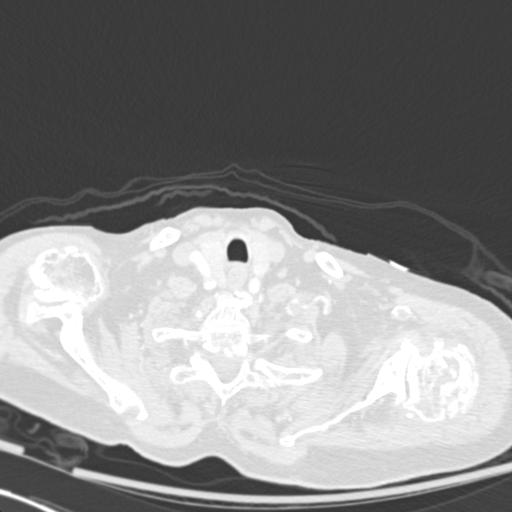

[Series 9: coronal mpr · coronal · 0.46mm/px · 1 of 114 slices shown]
[im 57/114  mediastinal]
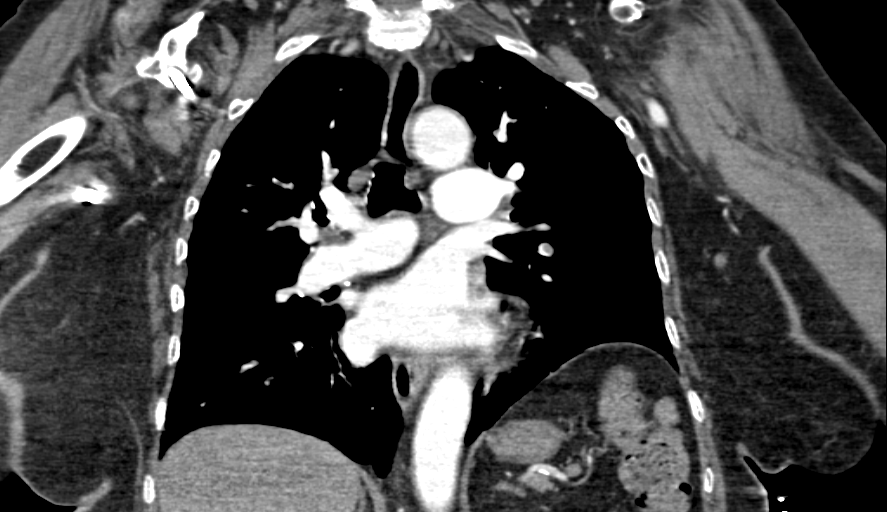

[18 of 36 positions shown; findings below may reference images not displayed]

FINDINGS: PULMONARY ARTERY: Adequate contrast opacification of the pulmonary
artery's. Main pulmonary artery is not enlarged. Mild expansile
filling defect within the lingula lobar pulmonary artery casting to
the segmental and subsegmental branches with occlusion. Acute
occlusive LEFT lower lobe segmental to subsegmental pulmonary
embolus. Strandy filling defects RIGHT upper lobe segmental, RIGHT
middle lobe segmental arteries. RIGHT lower lobe segmental to
subsegmental acute pulmonary embolus.

MEDIASTINUM: Heart size is mildly enlarged. Suspected RIGHT heart
strain objectively though, limited assessment due to cardiac motion.
Moderate coronary artery calcifications. No pericardial effusions.
Thoracic aorta is normal course and caliber, mild calcific
atherosclerosis. No lymphadenopathy by CT size criteria.

LUNGS: Tracheobronchial tree is patent, no pneumothorax. No pleural
effusions, focal consolidations, pulmonary nodules or masses. LEFT
lower lobe small calcified granuloma.

SOFT TISSUES AND OSSEOUS STRUCTURES: Included view of the abdomen is
nonacute; small hiatal hernia. Acute proximal LEFT humerus fracture.
Severe degenerative changes of bilateral shoulders, with screw
backout of RIGHT glenoid fossa. Old bilateral anterior with subacute
to chronic rib fractures. Osteopenia.

Review of the MIP images confirms the above findings.
IMPRESSION: Multiple acute bilateral occlusive pulmonary emboli, predominately
segmental the subsegmental. Suspected RIGHT heart strain though
limited by cardiac motion.

Acute proximal LEFT humerus fracture.

## 2017-06-26 IMAGING — CT CT HEAD W/O CM
2 series · 16 of 30 positions shown, 19 images · non-contrast
Comparison: CT dated 11/21/2015

CLINICAL DATA: 89-year-old female with recent fall lab presenting
with shortness of breath and bilateral lower extremity swelling.
Clinical concern for acute encephalopathy.

EXAM:
CT HEAD WITHOUT CONTRAST
TECHNIQUE: Contiguous axial images were obtained from the base of the skull
through the vertex without intravenous contrast.

[Series 2: head w/o · axial · non-contrast · 0.45mm/px · z∈[-65,+55]mm · 9 of 30 slices shown, 12 images]
[im 3/30  brain]
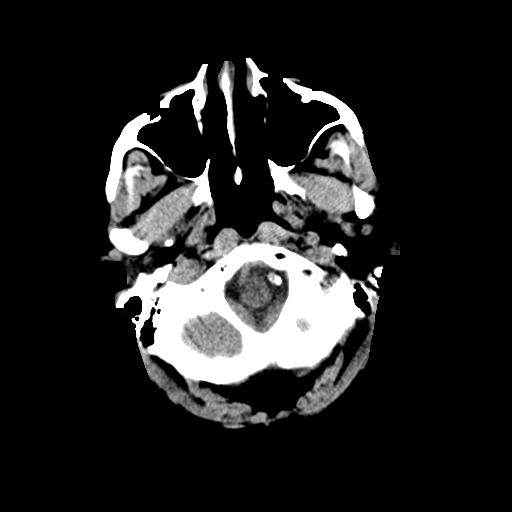
[im 3/30  bone]
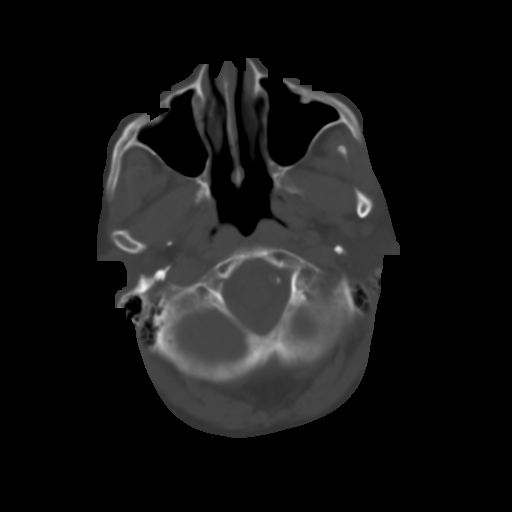
[im 6/30  brain]
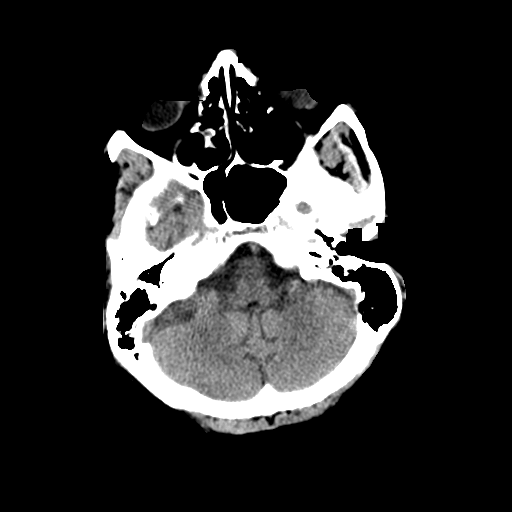
[im 9/30  brain]
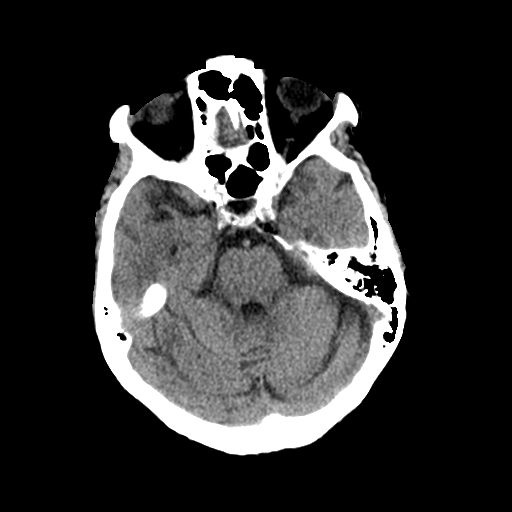
[im 12/30  brain]
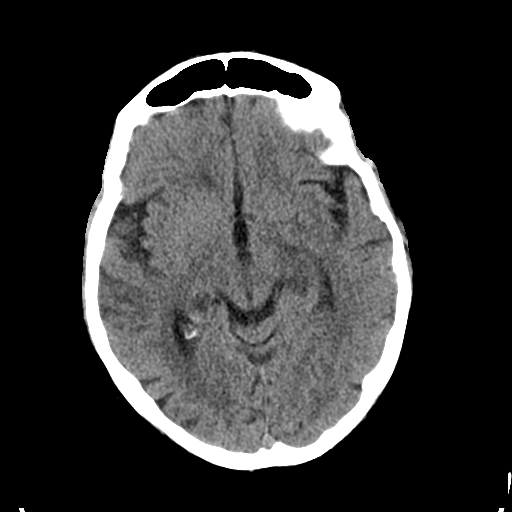
[im 15/30  brain]
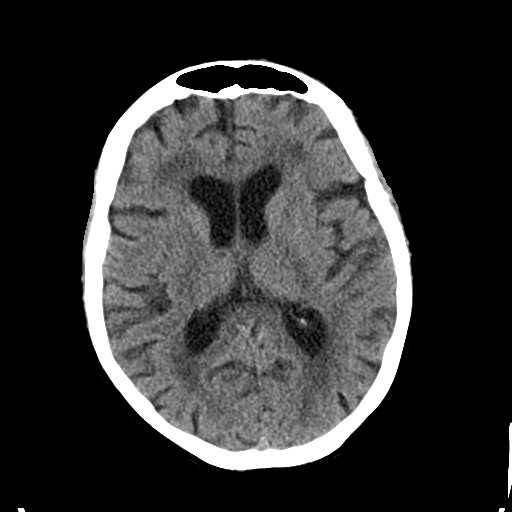
[im 15/30  bone]
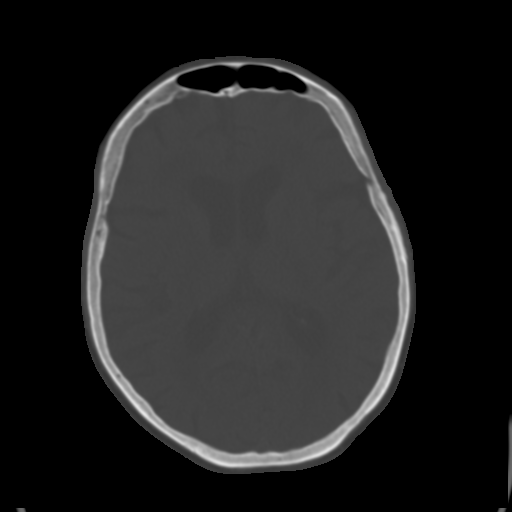
[im 18/30  brain]
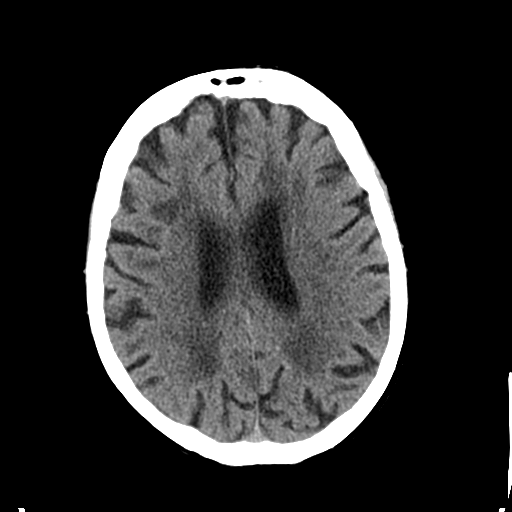
[im 21/30  brain]
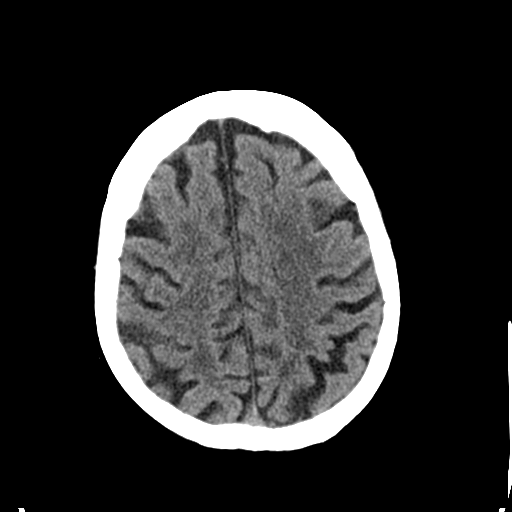
[im 24/30  brain]
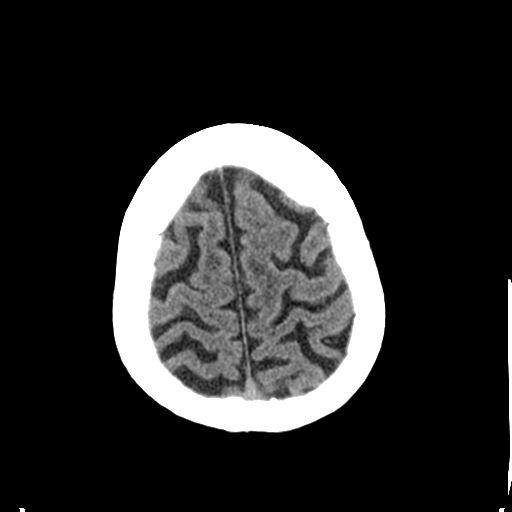
[im 27/30  brain]
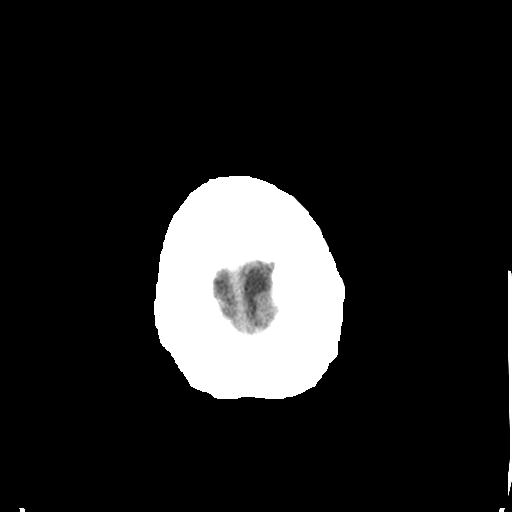
[im 27/30  bone]
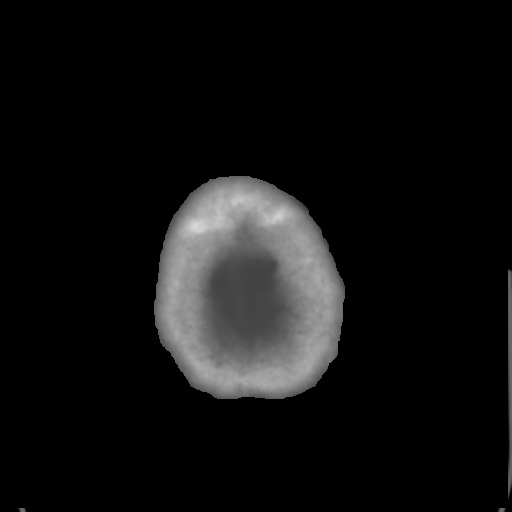

[Series 3: bone windows · axial · 0.45mm/px · z∈[-60,+36]mm · 7 of 49 slices shown]
[im 6/49  bone]
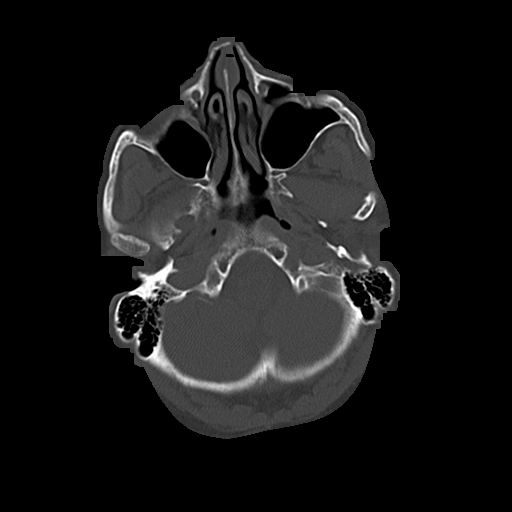
[im 11/49  bone]
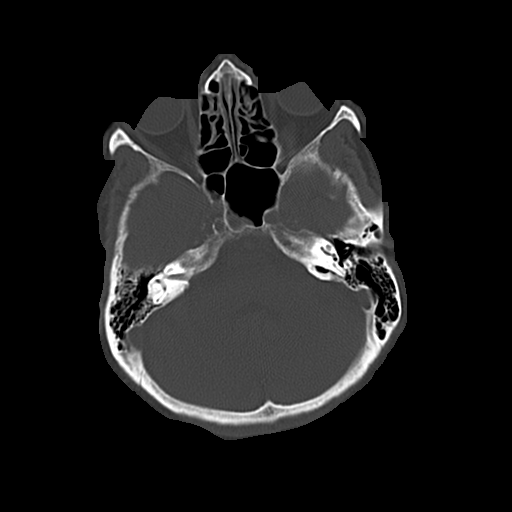
[im 17/49  bone]
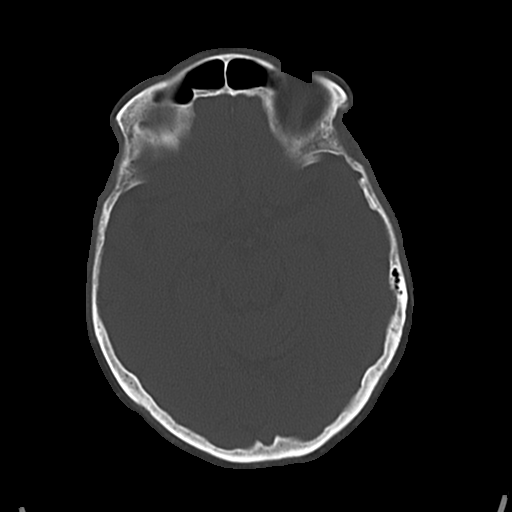
[im 22/49  bone]
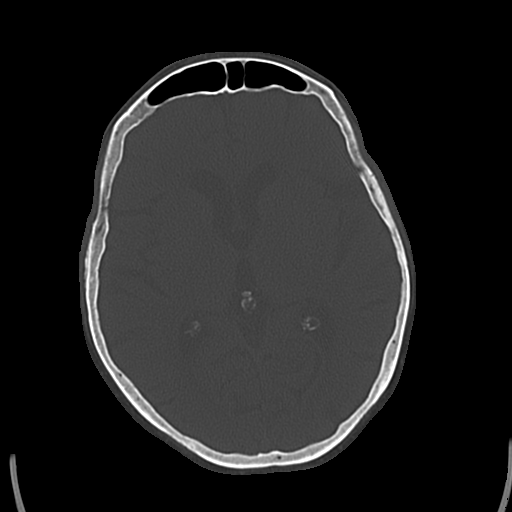
[im 27/49  bone]
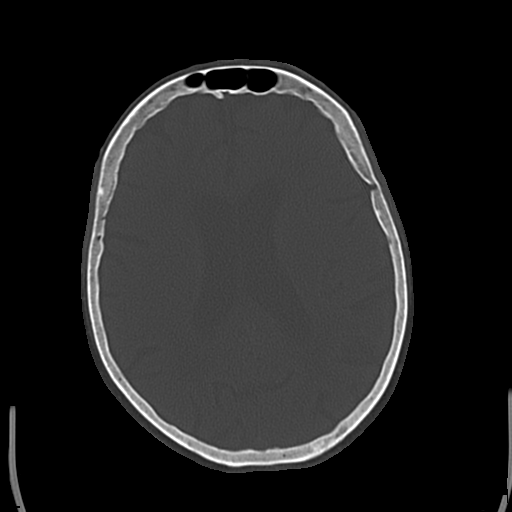
[im 33/49  bone]
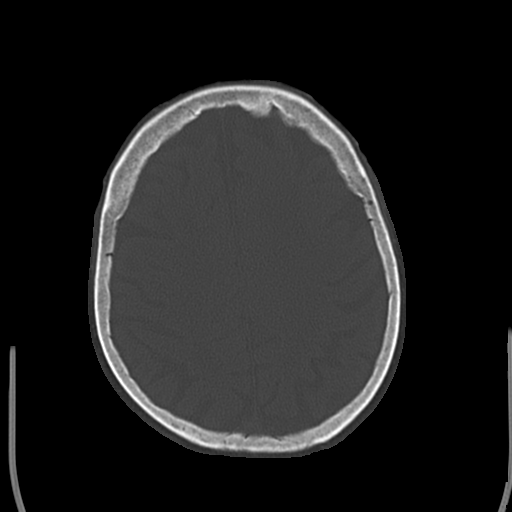
[im 38/49  bone]
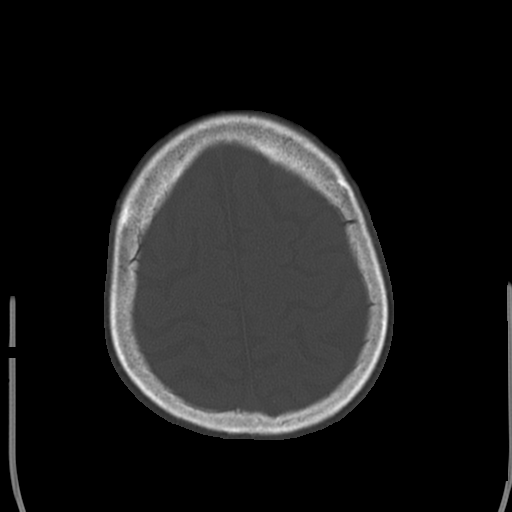

[16 of 30 positions shown; findings below may reference images not displayed]

FINDINGS: There is mild prominence of the ventricles and sulci compatible with
age-related atrophy. Moderate periventricular and deep white matter
chronic microvascular ischemic changes noted. There is no acute
intracranial hemorrhage. No mass effect or midline shift.

The visualized paranasal sinuses and mastoid air cells are clear.
The calvarium is intact.
IMPRESSION: No acute intracranial hemorrhage.

Age-related atrophy and chronic microvascular ischemic disease.

If symptoms persist and there are no contraindications, MRI may
provide better evaluation if clinically indicated.

## 2017-06-29 ENCOUNTER — Ambulatory Visit: Payer: Medicare Other | Admitting: Adult Health

## 2017-07-15 ENCOUNTER — Encounter: Payer: Self-pay | Admitting: Adult Health

## 2017-07-15 ENCOUNTER — Ambulatory Visit (INDEPENDENT_AMBULATORY_CARE_PROVIDER_SITE_OTHER): Payer: Medicare Other | Admitting: Adult Health

## 2017-07-15 VITALS — BP 130/65 | HR 77

## 2017-07-15 DIAGNOSIS — R413 Other amnesia: Secondary | ICD-10-CM | POA: Diagnosis not present

## 2017-07-15 NOTE — Patient Instructions (Addendum)
Your Plan:  Continue Namenda 10 mg twice a day Memory score decreased slightly. We will continue to monitor  Thank you for coming to see [place] at [facility]. I hope we have been able to provide you high quality care today.  You may receive a patient satisfaction survey over the next few weeks. We would appreciate your feedback and comments so that we may continue to improve ourselves and the health of our patients.   Dementia Dementia is the loss of two or more brain functions, such as:  Memory.  Decision making.  Behavior.  Speaking.  Thinking.  Problem solving.  There are many types of dementia. The most common type is called progressive dementia. Progressive dementia gets worse with time and it is irreversible. An example of this type of dementia is Alzheimer disease. What are the causes? This condition may be caused by:  Nerve cell damage in the brain.  Genetic mutations.  Certain medicines.  Multiple small strokes.  An infection, such as chronic meningitis.  A metabolic problem, such as vitamin B12 deficiency or thyroid disease.  Pressure on the brain, such as from a tumor or blood clot.  What are the signs or symptoms? Symptoms of this condition include:  Sudden changes in mood.  Depression.  Problems with balance.  Changes in personality.  Poor short-term memory.  Agitation.  Delusions.  Hallucinations.  Having a hard time: ? Speaking thoughts. ? Finding words. ? Solving problems. ? Doing familiar tasks. ? Understanding familiar ideas.  How is this diagnosed? This condition is diagnosed with an assessment by your health care provider. During this assessment, your health care provider will talk with you and your family, friends, or caregivers about your symptoms. A thorough medical history will be taken, and you will have a physical exam and tests. Tests may include:  Lab tests, such as blood or urine tests.  Imaging  tests, such as a CT scan, PET scan, or MRI.  A lumbar puncture. This test involves removing and testing a small amount of the fluid that surrounds the brain and spinal cord.  An electroencephalogram (EEG). In this test, small metal discs are used to measure electrical activity in the brain.  Memory tests, cognitive tests, and neuropsychological tests. These tests evaluate brain function.  How is this treated? Treatment depends on the cause of the dementia. It may involve taking medicines that may help:  To control the dementia.  To slow down the disease.  To manage symptoms.  In some cases, treating the cause of the dementia can improve symptoms, reverse symptoms, or slow down how quickly the dementia gets worse. Your health care provider can help direct you to support groups, organizations, and other health care providers who can help with decisions about your care. Follow these instructions at home: Medicine  Take over-the-counter and prescription medicines only as told by your health care provider.  Avoid taking medicines that can affect thinking, such as pain or sleeping medicines. Lifestyle   Make healthy lifestyle choices: ? Be physically active as told by your health care provider. ? Do not use any tobacco products, such as cigarettes, chewing tobacco, and e-cigarettes. If you need help quitting, ask your health care provider. ? Eat a healthy diet. ? Practice stress-management techniques when you get stressed. ? Stay social.  Drink enough fluid to keep your urine clear or pale yellow.  Make sure to get quality sleep. These tips can help you to get a good night's rest: ?  Avoid napping during the day. ? Keep your sleeping area dark and cool. ? Avoid exercising during the few hours before you go to bed. ? Avoid caffeine products in the evening. General instructions  Work with your health care provider to determine what you need help with and what your safety needs  are.  If you were given a bracelet that tracks your location, make sure to wear it.  Keep all follow-up visits as told by your health care provider. This is important. Contact a health care provider if:  You have any new symptoms.  You have problems with choking or swallowing.  You have any symptoms of a different illness. Get help right away if:  You develop a fever.  You have new or worsening confusion.  You have new or worsening sleepiness.  You have a hard time staying awake.  You or your family members become concerned for your safety. This information is not intended to replace advice given to you by your health care provider. Make sure you discuss any questions you have with your health care provider. Document Released: 04/01/2001 Document Revised: 02/14/2016 Document Reviewed: 07/04/2015 Elsevier Interactive Patient Education  2017 [employer].

## 2017-07-15 NOTE — Progress Notes (Signed)
I agree with the assessment and plan as directed by NP .The patient is known to me .   Dinita Migliaccio, MD  

## 2017-07-15 NOTE — Progress Notes (Signed)
PATIENT: [name] DOB: [birth year]  REASON FOR VISIT: follow up HISTORY FROM: patient  HISTORY OF PRESENT ILLNESS: Today 07/15/17 [name] is a 83 year old female with a history of memory disturbance. She returns today for follow-up. She is currently living at home but her daughter lives with her. She is able to complete most ADLs independently. She does need assistance with standing. She uses a cane when ambulating. Reports good appetite. Denies any trouble with her sleep. She does not operate a motor vehicle. Her daughter handles all of her finances. Daughter reports that she notices that she repeats same questions. Daughter states that she is very pleasant. She is currently on Namenda 10 mg twice a day. She returns today for an evaluation.  HISTORY 06/27/16: [name] is a 83 y.o. female , who will celebrate her 14 th Birthday tomorrow,  seen here as a referral  from Dr. Katrinka Blazing for memory problems.   Chief complaint according to patient : " I fell in January " .  [name] has been seen in my clinic but are not quite sure when I saw over the last time 5 or 6 years ago. She has lived independently until this year when after a fall she had to go through a prolonged rehabilitation at [facility] nursing home and at [facility]. She is now being helped by her daughter's and resides at [address] In her private home, again. In the past I have seen her for chronic vertigo balance problems.07/17/16 Her visit is a slight bit different, Dr. Katrinka Blazing described that the patient suffered a subarachnoid hemorrhage after a fall that she has also hypertensive heart disease without heart failure, pulmonary hypertension aortic valve insufficiency, joint pain and osteoporosis. She was chronically anticoagulated and she suffers from glaucoma. In 01/ 2017 she suffered a broken wrist. Her daughter is unclear about the TBI with falls, a CT from feb obtained in the ED showed no bleed- see  below.   I was able to see the most recent lab results for this patient from July 2017 obtained through [facility] physicians. Anion gap was high at 23.3, but there was normal sodium normal potassium and normal chloride, normal calcium protein and albumin normal liver function enzymes normal renal clearance, and a slightly elevated glucose level but the sample may not have been obtained 55. She had an excellent lipid panel, CBC with differential and urinalysis. Her vitamin D level was on the lower side, 24.5.    She feels that the may be some minor impairment of cognition since the fall and subarachnoid hemorrhage but she certainly does deny to struggle with dementia.  REVIEW OF SYSTEMS: Out of a complete 14 system review of symptoms, the patient complains only of the following symptoms, and all other reviewed systems are negative.  Swollen abdomen, constipation, hearing loss, walking difficulty, confusion, nervous/anxious, moles, sleep talking, daytime sleepiness, weakness, memory loss  ALLERGIES: No Known Allergies  HOME MEDICATIONS: Outpatient Medications Prior to Visit  Medication Sig Dispense Refill  . acetaminophen (TYLENOL) 325 MG tablet Take 650 mg by mouth 2 (two) times daily.     [name] apixaban (ELIQUIS) 5 MG TABS tablet Take 5 mg by mouth 2 (two) times daily.    [name] ezetimibe-simvastatin (VYTORIN) 10-20 MG per tablet Take 1 tablet by mouth daily.     . furosemide (LASIX) 20 MG tablet Take 1 tablet by mouth 2 (two) times daily.  2  . latanoprost (XALATAN) 0.005 % ophthalmic solution  Place 1 drop into both eyes at bedtime.    . memantine (NAMENDA) 10 MG tablet Take 1 tablet (10 mg total) by mouth 2 (two) times daily. 180 tablet 3  . metoprolol succinate (TOPROL-XL) 25 MG 24 hr tablet Take 1 tablet (25 mg total) by mouth daily. 30 tablet 6  . traMADol (ULTRAM) 50 MG tablet   2  . polyethylene glycol (MIRALAX / GLYCOLAX) packet Take 17 g by mouth as directed.     . memantine (NAMENDA  TITRATION PACK) tablet pack 5 mg/day for =1 week; 5 mg twice daily for =1 week; 15 mg/day given in 5 mg and 10 mg separated doses for =1 week; then 10 mg twice daily 49 tablet 12   No facility-administered medications prior to visit.     PAST MEDICAL HISTORY: Past Medical History:  Diagnosis Date  . Allergic rhinitis   . Aortic valve disorders   . Arthritis   . Dementia   . Dyslipidemia   . Glaucoma   . Hypercholesteremia   . Hypertension   . Obesity   . Vertigo     PAST SURGICAL HISTORY: Past Surgical History:  Procedure Laterality Date  . APPENDECTOMY    . bilataral cataracts removal with lens replacement Dr. Darel Hong    . CARDIAC CATHETERIZATION     normal coronary arteries  . SHOULDER SURGERY      FAMILY HISTORY: Family History  Problem Relation Age of Onset  . Heart disease Sister   . Stroke Neg Hx   . CAD Neg Hx   . Cancer Neg Hx     SOCIAL HISTORY: Social History   Social History  . Marital status: Widowed    Spouse name: N/A  . Number of children: 2  . Years of education: 12   Occupational History  . Worked for [employer]    Social History Main Topics  . Smoking status: Never Smoker  . Smokeless tobacco: Never Used  . Alcohol use No  . Drug use: No  . Sexual activity: No   Other Topics Concern  . Not on file   Social History Narrative   Lives with daughter      PHYSICAL EXAM  Vitals:   07/15/17 1324  BP: 130/65  Pulse: 77   There is no height or weight on file to calculate BMI.   MMSE - Mini Mental State Exam 07/15/2017 07/17/2016  Orientation to time 2 4  Orientation to Place 4 3  Registration 3 1  Attention/ Calculation 0 2  Recall 0 0  Language- name 2 objects 2 2  Language- repeat 0 0  Language- follow 3 step command 3 3  Language- read & follow direction 1 1  Write a sentence 0 1  Copy design 0 0  Total score 15 17     Generalized: Well developed, in no acute distress   Neurological examination    Mentation: Alert. Follows all commands speech and language fluent Cranial nerve II-XII: Pupils were equal round reactive to light. Extraocular movements were full, visual field were full on confrontational test. Facial sensation and strength were normal. Uvula tongue midline. Head turning and shoulder shrug  were normal and symmetric. Motor: The motor testing reveals 5 over 5 strength of all 4 extremities. Good symmetric motor tone is noted throughout.  Sensory: Sensory testing is intact to soft touch on all 4 extremities. No evidence of extinction is noted.  Coordination: Cerebellar testing reveals good finger-nose-finger and heel-to-shin bilaterally.  Gait  and station: Patient is in a wheelchair. Reflexes: Deep tendon reflexes are symmetric and normal bilaterally.   DIAGNOSTIC DATA (LABS, IMAGING, TESTING) - I reviewed patient records, labs, notes, testing and imaging myself where available.  Lab Results  Component Value Date   WBC 10.3 01/01/2016   HGB 9.9 (A) 01/01/2016   HCT 31 (A) 01/01/2016   MCV 92.3 12/25/2015   PLT 520 (A) 01/01/2016      Component Value Date/Time   NA 140 01/01/2016   K 4.5 01/01/2016   CL 103 12/24/2015 0457   CO2 26 12/24/2015 0457   GLUCOSE 97 12/24/2015 0457   BUN 21 01/01/2016   CREATININE 0.4 (A) 01/01/2016   CREATININE 0.45 12/24/2015 0457   CALCIUM 8.3 (L) 12/24/2015 0457   PROT 5.7 (L) 12/18/2015 0602   ALBUMIN 3.1 (L) 12/18/2015 0602   AST 25 12/18/2015 0602   ALT 12 (L) 12/18/2015 0602   ALKPHOS 102 12/18/2015 0602   BILITOT 0.9 12/18/2015 0602   GFRNONAA >60 12/24/2015 0457   GFRAA >60 12/24/2015 0457   Lab Results  Component Value Date   TSH 1.259 12/18/2015      ASSESSMENT AND PLAN 83 y.o. year old female  has a past medical history of Allergic rhinitis; Aortic valve disorders; Arthritis; Dementia; Dyslipidemia; Glaucoma; Hypercholesteremia; Hypertension; Obesity; and Vertigo. here with :  1. Memory disturbance  The  patient's memory score has slightly declined. She will continue on Namenda 10 mg twice a day. We will continue to monitor her memory. Patient and her daughter advised that if her symptoms worsen or she develops new symptoms she should  let us know. She will follow-up in 6 months or sooner if needed.  I spent 15 minutes with the patient. 50% of this time was spent reviewing memory score.      Butch Penny, MSN, NP-C 07/15/2017, 2:14 PM [facility] [address], [address] [place], [place] [address] [phone]

## 2017-08-18 ENCOUNTER — Ambulatory Visit: Payer: Medicare Other | Admitting: Sports Medicine

## 2017-08-25 ENCOUNTER — Other Ambulatory Visit: Payer: Self-pay | Admitting: Neurology

## 2017-08-25 DIAGNOSIS — S066X0A Traumatic subarachnoid hemorrhage without loss of consciousness, initial encounter: Secondary | ICD-10-CM

## 2017-08-25 DIAGNOSIS — F0150 Vascular dementia without behavioral disturbance: Secondary | ICD-10-CM

## 2017-09-08 ENCOUNTER — Encounter: Payer: Self-pay | Admitting: Sports Medicine

## 2017-09-08 ENCOUNTER — Ambulatory Visit (INDEPENDENT_AMBULATORY_CARE_PROVIDER_SITE_OTHER): Payer: Medicare Other | Admitting: Sports Medicine

## 2017-09-08 DIAGNOSIS — M79676 Pain in unspecified toe(s): Secondary | ICD-10-CM | POA: Diagnosis not present

## 2017-09-08 DIAGNOSIS — B351 Tinea unguium: Secondary | ICD-10-CM | POA: Diagnosis not present

## 2017-09-08 DIAGNOSIS — I739 Peripheral vascular disease, unspecified: Secondary | ICD-10-CM

## 2017-09-08 NOTE — Progress Notes (Signed)
Subjective: [name] is a 83 y.o. female patient seen today in office with complaint of painful thickened and elongated toenails; unable to trim. Patient has no other pedal complaints at this time.   Patient Active Problem List   Diagnosis Date Noted  . Lower leg DVT (deep venous thrombosis), right 12/26/2015  . Generalized weakness 12/26/2015  . Pulmonary embolism without acute cor pulmonale (HCC) 12/26/2015  . Humerus fracture 12/18/2015  . Pulmonary embolism (HCC) 12/18/2015  . Acute encephalopathy   . Fracture of humerus, left, closed 12/17/2015  . SAH (subarachnoid hemorrhage) (HCC) 11/16/2015  . Diastolic dysfunction-grade 2 11/16/2015  . Wrist fracture 11/14/2015  . Fall 11/14/2015  . Essential hypertension   . Hypercholesteremia   . Aortic stenosis, severe   . Allergic rhinitis   . Arthritis   . Dyslipidemia   . Obesity     Current Outpatient Medications on File Prior to Visit  Medication Sig Dispense Refill  . acetaminophen (TYLENOL) 325 MG tablet Take 650 mg by mouth 2 (two) times daily.     [name]. apixaban (ELIQUIS) 5 MG TABS tablet Take 5 mg by mouth 2 (two) times daily.    [name]. ezetimibe-simvastatin (VYTORIN) 10-20 MG per tablet Take 1 tablet by mouth daily.     . furosemide (LASIX) 20 MG tablet Take 1 tablet by mouth 2 (two) times daily.  2  . latanoprost (XALATAN) 0.005 % ophthalmic solution Place 1 drop into both eyes at bedtime.    . memantine (NAMENDA) 10 MG tablet TAKE 1 TABLET TWICE DAILY. 180 tablet 1  . metoprolol succinate (TOPROL-XL) 25 MG 24 hr tablet Take 1 tablet (25 mg total) by mouth daily. 30 tablet 6  . polyethylene glycol (MIRALAX / GLYCOLAX) packet Take 17 g by mouth as directed.     . traMADol (ULTRAM) 50 MG tablet   2   No current facility-administered medications on file prior to visit.     No Known Allergies  Objective: Physical Exam  General: Well developed, nourished, no acute distress, awake, alert and oriented x 3  Vascular:  Dorsalis pedis artery 1/4 bilateral, Posterior tibial artery 0/4 bilateral due to 1+ pitting edema, venous stasis skin changes, skin temperature warm to warm proximal to distal bilateral lower extremities, + varicosities, - pedal hair present bilateral.  Neurological: Gross sensation present via light touch bilateral.   Dermatological: Skin is warm, dry, and supple bilateral, Nails 1-10 are tender, long, thick, and discolored with mild subungal debris, no webspace macerations present bilateral, no open lesions present bilateral, mild callus plantar lateral left foot. No signs of infection bilateral.  Musculoskeletal:Asymptomatic hammertoe boney deformities noted bilateral. Muscular strength within normal limits without painon range of motion. No pain with calf compression bilateral.  Assessment and Plan:  Problem List Items Addressed This Visit    None    Visit Diagnoses    Pain due to onychomycosis of toenail    -  Primary   PVD (peripheral vascular disease) (HCC)         -Examined patient.  -Discussed treatment options for painful mycotic nails. -Mechanically debrided and reduced mycotic nails with sterile nail nipper and dremel nail file without incident. -Recommend elevation of legs, diet control, and continue with Lasix for edema control; Recommend to discuss with heart doc possible compression stockings  -Recommend good supportive shoes for foot type and offloading padding to callus area on left foot -Patient to return in 3 months for follow up evaluation or sooner if symptoms  worsen.  Landis Martins, DPM

## 2017-12-15 ENCOUNTER — Ambulatory Visit: Payer: Medicare Other | Admitting: Sports Medicine

## 2018-01-05 ENCOUNTER — Ambulatory Visit: Payer: Medicare Other | Admitting: Sports Medicine

## 2018-01-12 ENCOUNTER — Encounter: Payer: Self-pay | Admitting: Adult Health

## 2018-01-12 ENCOUNTER — Ambulatory Visit: Payer: Medicare Other | Admitting: Adult Health

## 2018-01-12 DIAGNOSIS — F0150 Vascular dementia without behavioral disturbance: Secondary | ICD-10-CM

## 2018-01-12 MED ORDER — MEMANTINE HCL 10 MG PO TABS: 10.0000 mg | ORAL_TABLET | Freq: Two times a day (BID) | ORAL | 3 refills | Status: DC

## 2018-01-12 NOTE — Progress Notes (Signed)
I agree with the Dementia assessment and plan as directed by NP . The patient will continue on Namenda .   Athira Janowicz, MD

## 2018-01-12 NOTE — Progress Notes (Signed)
PATIENT: [name] DOB: 28-Jun-1924  REASON FOR VISIT: follow up HISTORY FROM: patient  HISTORY OF PRESENT ILLNESS: Today 01/12/18 [name] is a 84 year old female with a history of memory disturbance.  She returns today for follow-up.  She continues to live at home with her daughter.  She is currently on Namenda 10 mg twice a day.  Reports that she is tolerating well.  He feels that her memory has remained stable.  She is able to complete most ADLs independently.  Reports that she does need some assistance with dressing.  She does not operate a motor vehicle.  Denies any trouble with her sleep.  Reports good appetite.  Denies any changes in her mood or behavior.  Denies hallucinations.  She uses a cane when ambulating.  Denies any recent falls.  She returns today for an evaluation.  HISTORY 07/15/17 [name] is a 84 year old female with a history of memory disturbance. She returns today for follow-up. She is currently living at home but her daughter lives with her. She is able to complete most ADLs independently. She does need assistance with standing. She uses a cane when ambulating. Reports good appetite. Denies any trouble with her sleep. She does not operate a motor vehicle. Her daughter handles all of her finances. Daughter reports that she notices that she repeats same questions. Daughter states that she is very pleasant. She is currently on Namenda 10 mg twice a day. She returns today for an evaluation.   REVIEW OF SYSTEMS: Out of a complete 14 system review of symptoms, the patient complains only of the following symptoms, and all other reviewed systems are negative.  Aches, memory loss, agitation, nervous/anxious, walking difficulty, moles, itching, sleep, murmur  ALLERGIES: No Known Allergies  HOME MEDICATIONS: Outpatient Medications Prior to Visit  Medication Sig Dispense Refill  . acetaminophen (TYLENOL) 325 MG tablet Take 650 mg by mouth 2 (two) times daily.     [name]  apixaban (ELIQUIS) 5 MG TABS tablet Take 5 mg by mouth 2 (two) times daily.    [name] ezetimibe-simvastatin (VYTORIN) 10-20 MG per tablet Take 1 tablet by mouth daily.     . furosemide (LASIX) 20 MG tablet Take 1 tablet by mouth 2 (two) times daily.  2  . latanoprost (XALATAN) 0.005 % ophthalmic solution Place 1 drop into both eyes at bedtime.    . memantine (NAMENDA) 10 MG tablet TAKE 1 TABLET TWICE DAILY. 180 tablet 1  . metoprolol succinate (TOPROL-XL) 25 MG 24 hr tablet Take 1 tablet (25 mg total) by mouth daily. 30 tablet 6  . polyethylene glycol (MIRALAX / GLYCOLAX) packet Take 17 g by mouth as directed.     . traMADol (ULTRAM) 50 MG tablet   2   No facility-administered medications prior to visit.     PAST MEDICAL HISTORY: Past Medical History:  Diagnosis Date  . Allergic rhinitis   . Aortic valve disorders   . Arthritis   . Dementia   . Dyslipidemia   . Glaucoma   . Hypercholesteremia   . Hypertension   . Obesity   . Vertigo     PAST SURGICAL HISTORY: Past Surgical History:  Procedure Laterality Date  . APPENDECTOMY    . bilataral cataracts removal with lens replacement Dr. Darel Hong    . CARDIAC CATHETERIZATION     normal coronary arteries  . SHOULDER SURGERY      FAMILY HISTORY: Family History  Problem Relation Age of Onset  . Heart disease  Sister   . Stroke Neg Hx   . CAD Neg Hx   . Cancer Neg Hx     SOCIAL HISTORY: Social History   Socioeconomic History  . Marital status: Widowed    Spouse name: Not on file  . Number of children: 2  . Years of education: 9  . Highest education level: Not on file  Occupational History  . Occupation: Worked for city [employer] Needs  . Financial resource strain: Not on file  . Food insecurity:    Worry: Not on file    Inability: Not on file  . Transportation needs:    Medical: Not on file    Non-medical: Not on file  Tobacco Use  . Smoking status: Never Smoker  . Smokeless tobacco: Never Used    Substance and Sexual Activity  . Alcohol use: No    Alcohol/week: 0.0 oz  . Drug use: No  . Sexual activity: Never  Lifestyle  . Physical activity:    Days per week: Not on file    Minutes per session: Not on file  . Stress: Not on file  Relationships  . Social connections:    Talks on phone: Not on file    Gets together: Not on file    Attends religious service: Not on file    Active member of club or organization: Not on file    Attends meetings of clubs or organizations: Not on file    Relationship status: Not on file  . Intimate partner violence:    Fear of current or ex partner: Not on file    Emotionally abused: Not on file    Physically abused: Not on file    Forced sexual activity: Not on file  Other Topics Concern  . Not on file  Social History Narrative   Lives with daughter      PHYSICAL EXAM  Vitals:   01/12/18 1333  BP: 134/78  Pulse: 68  Weight: 184 lb (83.5 kg)   Body mass index is 33.65 kg/m.   MMSE - Mini Mental State Exam 01/12/2018 07/15/2017 07/17/2016  Orientation to time 3 2 4   Orientation to Place 4 4 3   Registration 2 3 1   Attention/ Calculation 0 0 2  Recall 0 0 0  Language- name 2 objects 2 2 2   Language- repeat 0 0 0  Language- follow 3 step command 3 3 3   Language- read & follow direction 1 1 1   Write a sentence 1 0 1  Copy design 0 0 0  Total score 16 15 17     Generalized: Well developed, in no acute distress   Neurological examination  Mentation: Alert . Follows all commands speech and language fluent Cranial nerve II-XII: Pupils were equal round reactive to light. Extraocular movements were full, visual field were full on confrontational test. Facial sensation and strength were normal. Uvula tongue midline. Head turning and shoulder shrug  were normal and symmetric. Motor: The motor testing reveals 5 over 5 strength of all 4 extremities. Good symmetric motor tone is noted throughout.  Sensory: Sensory testing is intact to  soft touch on all 4 extremities. No evidence of extinction is noted.  Coordination: Cerebellar testing reveals good finger-nose-finger and heel-to-shin bilaterally.  Is more she is really night Gait and station: Patient uses a cane when ambulating. Reflexes: Deep tendon reflexes are symmetric and normal bilaterally.   DIAGNOSTIC DATA (LABS, IMAGING, TESTING) - I reviewed patient records, labs, notes, testing and imaging  myself where available.  Lab Results  Component Value Date   WBC 10.3 01/01/2016   HGB 9.9 (A) 01/01/2016   HCT 31 (A) 01/01/2016   MCV 92.3 12/25/2015   PLT 520 (A) 01/01/2016      Component Value Date/Time   NA 140 01/01/2016   K 4.5 01/01/2016   CL 103 12/24/2015 0457   CO2 26 12/24/2015 0457   GLUCOSE 97 12/24/2015 0457   BUN 21 01/01/2016   CREATININE 0.4 (A) 01/01/2016   CREATININE 0.45 12/24/2015 0457   CALCIUM 8.3 (L) 12/24/2015 0457   PROT 5.7 (L) 12/18/2015 0602   ALBUMIN 3.1 (L) 12/18/2015 0602   AST 25 12/18/2015 0602   ALT 12 (L) 12/18/2015 0602   ALKPHOS 102 12/18/2015 0602   BILITOT 0.9 12/18/2015 0602   GFRNONAA >60 12/24/2015 0457   GFRAA >60 12/24/2015 0457    Lab Results  Component Value Date   TSH 1.259 12/18/2015      ASSESSMENT AND PLAN 84 y.o. year old female  has a past medical history of Allergic rhinitis, Aortic valve disorders, Arthritis, Dementia, Dyslipidemia, Glaucoma, Hypercholesteremia, Hypertension, Obesity, and Vertigo. here with:  1.  Memory disturbance  The patient's memory score has remained stable.  Will continue on Namenda 10 mg twice a day.  I have advised that if her symptoms worsen or she develops new symptoms she should let us know.  She will follow-up in 6 months or sooner if needed.   I spent 15 minutes with the patient. 50% of this time was spent reviewing her memory score   Butch PennyMegan Daquana Paddock, MSN, NP-C 01/12/2018, 1:26 PM [facility] [address], [address] [place], [place]  [id number] [phone]

## 2018-01-12 NOTE — Patient Instructions (Signed)
Your Plan:  Continue Namenda 10 mg twice a day Memory score is stable If your symptoms worsen or you develop new symptoms please let us know.   Thank you for coming to see us at [facility]. I hope we have been able to provide you high quality care today.  You may receive a patient satisfaction survey over the next few weeks. We would appreciate your feedback and comments so that we may continue to improve ourselves and the health of our patients.  

## 2018-01-19 ENCOUNTER — Ambulatory Visit: Payer: Medicare Other | Admitting: Sports Medicine

## 2018-02-02 ENCOUNTER — Ambulatory Visit (INDEPENDENT_AMBULATORY_CARE_PROVIDER_SITE_OTHER): Payer: Medicare Other | Admitting: Sports Medicine

## 2018-02-02 ENCOUNTER — Encounter: Payer: Self-pay | Admitting: Sports Medicine

## 2018-02-02 DIAGNOSIS — M79676 Pain in unspecified toe(s): Secondary | ICD-10-CM | POA: Diagnosis not present

## 2018-02-02 DIAGNOSIS — B351 Tinea unguium: Secondary | ICD-10-CM

## 2018-02-02 DIAGNOSIS — Q828 Other specified congenital malformations of skin: Secondary | ICD-10-CM

## 2018-02-02 DIAGNOSIS — I739 Peripheral vascular disease, unspecified: Secondary | ICD-10-CM

## 2018-02-02 NOTE — Progress Notes (Signed)
Subjective: Karen DessertColleen M Kelley is a 84 y.o. female patient seen today in office with complaint of painful callus on left and thickened and elongated toenails; unable to trim. Patient has no other pedal complaints at this time.   Patient Active Problem List   Diagnosis Date Noted  . Lower leg DVT (deep venous thrombosis), right 12/26/2015  . Generalized weakness 12/26/2015  . Pulmonary embolism without acute cor pulmonale (HCC) 12/26/2015  . Humerus fracture 12/18/2015  . Pulmonary embolism (HCC) 12/18/2015  . Acute encephalopathy   . Fracture of humerus, left, closed 12/17/2015  . SAH (subarachnoid hemorrhage) (HCC) 11/16/2015  . Diastolic dysfunction-grade 2 11/16/2015  . Wrist fracture 11/14/2015  . Fall 11/14/2015  . Essential hypertension   . Hypercholesteremia   . Aortic stenosis, severe   . Allergic rhinitis   . Arthritis   . Dyslipidemia   . Obesity     Current Outpatient Medications on File Prior to Visit  Medication Sig Dispense Refill  . acetaminophen (TYLENOL) 325 MG tablet Take 650 mg by mouth 2 (two) times daily.     [name]. apixaban (ELIQUIS) 5 MG TABS tablet Take 5 mg by mouth 2 (two) times daily.    [name]. ezetimibe-simvastatin (VYTORIN) 10-20 MG per tablet Take 1 tablet by mouth daily.     . furosemide (LASIX) 20 MG tablet Take 1 tablet by mouth 2 (two) times daily.  2  . latanoprost (XALATAN) 0.005 % ophthalmic solution Place 1 drop into both eyes at bedtime.    . memantine (NAMENDA) 10 MG tablet Take 1 tablet (10 mg total) by mouth 2 (two) times daily. 180 tablet 3  . metoprolol succinate (TOPROL-XL) 25 MG 24 hr tablet Take 1 tablet (25 mg total) by mouth daily. 30 tablet 6  . polyethylene glycol (MIRALAX / GLYCOLAX) packet Take 17 g by mouth as directed.     . traMADol (ULTRAM) 50 MG tablet   2   No current facility-administered medications on file prior to visit.     No Known Allergies  Objective: Physical Exam  General: Well developed, nourished, no acute  distress, awake, alert and oriented x 3  Vascular: Dorsalis pedis artery 1/4 bilateral, Posterior tibial artery 0/4 bilateral due to 1+ pitting edema, venous stasis skin changes, skin temperature warm to warm proximal to distal bilateral lower extremities, + varicosities, - pedal hair present bilateral.  Neurological: Gross sensation present via light touch bilateral.   Dermatological: Skin is warm, dry, and supple bilateral, Nails 1-10 are tender, long, thick, and discolored with mild subungal debris, no webspace macerations present bilateral, no open lesions present bilateral, moderate callus plantar lateral left foot with nucleated core. No signs of infection bilateral.  Musculoskeletal:Asymptomatic hammertoe boney deformities noted bilateral. Muscular strength within normal limits without painon range of motion. No pain with calf compression bilateral.  Assessment and Plan:  Problem List Items Addressed This Visit    None    Visit Diagnoses    Pain due to onychomycosis of toenail    -  Primary   Porokeratosis       PVD (peripheral vascular disease) (HCC)         -Examined patient.  -Discussed treatment options for painful mycotic nails. -Mechanically debrided callus lateral left foot using sterile chisel blade and reduced mycotic nails with sterile nail nipper and dremel nail file without incident. -Recommend elevation of legs, diet control, and continue with Lasix for edema control -Recommend good supportive shoes  -Patient to return in 3  months for follow up evaluation or sooner if symptoms worsen.  Landis Martins, DPM

## 2018-05-04 ENCOUNTER — Ambulatory Visit: Payer: Medicare Other | Admitting: Sports Medicine

## 2018-05-10 ENCOUNTER — Ambulatory Visit: Payer: Medicare Other | Admitting: Sports Medicine

## 2018-05-25 ENCOUNTER — Ambulatory Visit: Payer: Medicare Other | Admitting: Sports Medicine

## 2018-07-27 ENCOUNTER — Ambulatory Visit: Payer: Medicare Other | Admitting: Adult Health

## 2018-08-18 ENCOUNTER — Emergency Department (HOSPITAL_COMMUNITY): Payer: Medicare Other

## 2018-08-18 ENCOUNTER — Inpatient Hospital Stay (HOSPITAL_COMMUNITY)
Admission: EM | Admit: 2018-08-18 | Discharge: 2018-08-24 | DRG: 690 | Disposition: A | Discharge status: Skilled Nursing Facility | Payer: Medicare Other | Attending: Family Medicine | Admitting: Family Medicine

## 2018-08-18 ENCOUNTER — Other Ambulatory Visit: Payer: Self-pay

## 2018-08-18 ENCOUNTER — Encounter (HOSPITAL_COMMUNITY): Payer: Self-pay

## 2018-08-18 DIAGNOSIS — N390 Urinary tract infection, site not specified: Principal | ICD-10-CM | POA: Diagnosis present

## 2018-08-18 DIAGNOSIS — Z7901 Long term (current) use of anticoagulants: Secondary | ICD-10-CM

## 2018-08-18 DIAGNOSIS — I10 Essential (primary) hypertension: Secondary | ICD-10-CM

## 2018-08-18 DIAGNOSIS — L0390 Cellulitis, unspecified: Secondary | ICD-10-CM

## 2018-08-18 DIAGNOSIS — L304 Erythema intertrigo: Secondary | ICD-10-CM | POA: Diagnosis present

## 2018-08-18 DIAGNOSIS — F0391 Unspecified dementia with behavioral disturbance: Secondary | ICD-10-CM | POA: Diagnosis present

## 2018-08-18 DIAGNOSIS — Z66 Do not resuscitate: Secondary | ICD-10-CM | POA: Diagnosis present

## 2018-08-18 DIAGNOSIS — E872 Acidosis: Secondary | ICD-10-CM | POA: Diagnosis present

## 2018-08-18 DIAGNOSIS — I350 Nonrheumatic aortic (valve) stenosis: Secondary | ICD-10-CM | POA: Diagnosis present

## 2018-08-18 DIAGNOSIS — S0990XA Unspecified injury of head, initial encounter: Secondary | ICD-10-CM | POA: Diagnosis present

## 2018-08-18 DIAGNOSIS — E7800 Pure hypercholesterolemia, unspecified: Secondary | ICD-10-CM | POA: Diagnosis present

## 2018-08-18 DIAGNOSIS — L89899 Pressure ulcer of other site, unspecified stage: Secondary | ICD-10-CM | POA: Diagnosis present

## 2018-08-18 DIAGNOSIS — Z79899 Other long term (current) drug therapy: Secondary | ICD-10-CM

## 2018-08-18 DIAGNOSIS — E785 Hyperlipidemia, unspecified: Secondary | ICD-10-CM | POA: Diagnosis present

## 2018-08-18 DIAGNOSIS — Z8249 Family history of ischemic heart disease and other diseases of the circulatory system: Secondary | ICD-10-CM

## 2018-08-18 DIAGNOSIS — R451 Restlessness and agitation: Secondary | ICD-10-CM | POA: Diagnosis present

## 2018-08-18 DIAGNOSIS — W19XXXA Unspecified fall, initial encounter: Secondary | ICD-10-CM | POA: Diagnosis present

## 2018-08-18 DIAGNOSIS — N3000 Acute cystitis without hematuria: Secondary | ICD-10-CM | POA: Diagnosis not present

## 2018-08-18 DIAGNOSIS — Z86711 Personal history of pulmonary embolism: Secondary | ICD-10-CM

## 2018-08-18 DIAGNOSIS — L03314 Cellulitis of groin: Secondary | ICD-10-CM

## 2018-08-18 DIAGNOSIS — L26 Exfoliative dermatitis: Secondary | ICD-10-CM | POA: Diagnosis present

## 2018-08-18 DIAGNOSIS — R6510 Systemic inflammatory response syndrome (SIRS) of non-infectious origin without acute organ dysfunction: Secondary | ICD-10-CM | POA: Diagnosis present

## 2018-08-18 DIAGNOSIS — F03918 Unspecified dementia, unspecified severity, with other behavioral disturbance: Secondary | ICD-10-CM

## 2018-08-18 DIAGNOSIS — L8990 Pressure ulcer of unspecified site, unspecified stage: Secondary | ICD-10-CM

## 2018-08-18 DIAGNOSIS — Y92002 Bathroom of unspecified non-institutional (private) residence single-family (private) house as the place of occurrence of the external cause: Secondary | ICD-10-CM

## 2018-08-18 DIAGNOSIS — Z79891 Long term (current) use of opiate analgesic: Secondary | ICD-10-CM

## 2018-08-18 DIAGNOSIS — E669 Obesity, unspecified: Secondary | ICD-10-CM | POA: Diagnosis present

## 2018-08-18 DIAGNOSIS — B9620 Unspecified Escherichia coli [E. coli] as the cause of diseases classified elsewhere: Secondary | ICD-10-CM | POA: Diagnosis present

## 2018-08-18 DIAGNOSIS — F05 Delirium due to known physiological condition: Secondary | ICD-10-CM | POA: Diagnosis present

## 2018-08-18 DIAGNOSIS — Z86718 Personal history of other venous thrombosis and embolism: Secondary | ICD-10-CM

## 2018-08-18 DIAGNOSIS — R109 Unspecified abdominal pain: Secondary | ICD-10-CM | POA: Diagnosis not present

## 2018-08-18 LAB — CBC
HEMATOCRIT: 40.9 % (ref 36.0–46.0)
Hemoglobin: 12.2 g/dL (ref 12.0–15.0)
MCH: 27.7 pg (ref 26.0–34.0)
MCHC: 29.8 g/dL — ABNORMAL LOW (ref 30.0–36.0)
MCV: 93 fL (ref 80.0–100.0)
NRBC: 0 % (ref 0.0–0.2)
PLATELETS: 307 10*3/uL (ref 150–400)
RBC: 4.4 MIL/uL (ref 3.87–5.11)
RDW: 15.3 % (ref 11.5–15.5)
WBC: 9.8 10*3/uL (ref 4.0–10.5)

## 2018-08-18 LAB — URINALYSIS, ROUTINE W REFLEX MICROSCOPIC
BILIRUBIN URINE: NEGATIVE
GLUCOSE, UA: NEGATIVE mg/dL
KETONES UR: 5 mg/dL — AB
NITRITE: POSITIVE — AB
Protein, ur: NEGATIVE mg/dL
Specific Gravity, Urine: 1.011 (ref 1.005–1.030)
pH: 6 (ref 5.0–8.0)

## 2018-08-18 LAB — HEPATIC FUNCTION PANEL
ALBUMIN: 3 g/dL — AB (ref 3.5–5.0)
ALK PHOS: 83 U/L (ref 38–126)
ALT: 10 U/L (ref 0–44)
AST: 19 U/L (ref 15–41)
Bilirubin, Direct: 0.2 mg/dL (ref 0.0–0.2)
Indirect Bilirubin: 0.4 mg/dL (ref 0.3–0.9)
Total Bilirubin: 0.6 mg/dL (ref 0.3–1.2)
Total Protein: 6.7 g/dL (ref 6.5–8.1)

## 2018-08-18 LAB — BASIC METABOLIC PANEL
ANION GAP: 10 (ref 5–15)
BUN: 14 mg/dL (ref 8–23)
CO2: 27 mmol/L (ref 22–32)
Calcium: 9 mg/dL (ref 8.9–10.3)
Chloride: 100 mmol/L (ref 98–111)
Creatinine, Ser: 0.8 mg/dL (ref 0.44–1.00)
GFR calc Af Amer: >60 mL/min (ref >60)
Glucose, Bld: 115 mg/dL — ABNORMAL HIGH (ref 70–99)
POTASSIUM: 4.3 mmol/L (ref 3.5–5.1)
Sodium: 137 mmol/L (ref 135–145)

## 2018-08-18 LAB — I-STAT CG4 LACTIC ACID, ED
Lactic Acid, Venous: 2.06 mmol/L (ref 0.5–1.9)
Lactic Acid, Venous: 1.68 mmol/L (ref 0.5–1.9)

## 2018-08-18 LAB — I-STAT TROPONIN, ED: Troponin i, poc: 0.02 ng/mL (ref 0.00–0.08)

## 2018-08-18 MED ORDER — VANCOMYCIN HCL IN DEXTROSE 1-5 GM/200ML-% IV SOLN: 1000.0000 mg | INTRAVENOUS | Status: DC

## 2018-08-18 MED ORDER — SODIUM CHLORIDE 0.9 % IV BOLUS (SEPSIS)
1000.0000 mL | Freq: Once | INTRAVENOUS | Status: AC
  Administered 2018-08-18: 1000 mL via INTRAVENOUS

## 2018-08-18 MED ORDER — IOHEXOL 300 MG/ML  SOLN
100.0000 mL | Freq: Once | INTRAMUSCULAR | Status: AC | PRN
  Administered 2018-08-18: 100 mL via INTRAVENOUS

## 2018-08-18 MED ORDER — POLYETHYLENE GLYCOL 3350 17 G PO PACK
17.0000 g | PACK | Freq: Every day | ORAL | Status: DC | PRN
  Administered 2018-08-20: 17 g via ORAL
  Filled 2018-08-18: qty 1

## 2018-08-18 MED ORDER — ONDANSETRON HCL 4 MG PO TABS: 4.0000 mg | ORAL_TABLET | Freq: Four times a day (QID) | ORAL | Status: DC | PRN

## 2018-08-18 MED ORDER — TRAMADOL HCL 50 MG PO TABS
50.0000 mg | ORAL_TABLET | Freq: Two times a day (BID) | ORAL | Status: DC
  Administered 2018-08-18 – 2018-08-23 (×10): 50 mg via ORAL
  Filled 2018-08-18 (×11): qty 1

## 2018-08-18 MED ORDER — SODIUM CHLORIDE 0.9 % IV SOLN
2.0000 g | INTRAVENOUS | Status: DC
  Administered 2018-08-18: 2 g via INTRAVENOUS
  Filled 2018-08-18: qty 20

## 2018-08-18 MED ORDER — ACETAMINOPHEN 325 MG PO TABS
650.0000 mg | ORAL_TABLET | Freq: Four times a day (QID) | ORAL | Status: DC | PRN
  Administered 2018-08-20 – 2018-08-23 (×4): 650 mg via ORAL
  Filled 2018-08-18 (×4): qty 2

## 2018-08-18 MED ORDER — ONDANSETRON HCL 4 MG/2ML IJ SOLN: 4.0000 mg | Freq: Four times a day (QID) | INTRAMUSCULAR | Status: DC | PRN

## 2018-08-18 MED ORDER — EZETIMIBE-SIMVASTATIN 10-20 MG PO TABS
1.0000 | ORAL_TABLET | Freq: Every day | ORAL | Status: DC
  Administered 2018-08-19 – 2018-08-23 (×4): 1 via ORAL
  Filled 2018-08-18 (×7): qty 1

## 2018-08-18 MED ORDER — SODIUM CHLORIDE 0.9 % IV SOLN
1.0000 g | INTRAVENOUS | Status: DC
  Administered 2018-08-19 – 2018-08-23 (×4): 1 g via INTRAVENOUS
  Filled 2018-08-18 (×6): qty 10

## 2018-08-18 MED ORDER — SODIUM CHLORIDE 0.9 % IV SOLN
1000.0000 mL | INTRAVENOUS | Status: DC
  Administered 2018-08-18: 1000 mL via INTRAVENOUS

## 2018-08-18 MED ORDER — ACETAMINOPHEN 650 MG RE SUPP: 650.0000 mg | Freq: Four times a day (QID) | RECTAL | Status: DC | PRN

## 2018-08-18 MED ORDER — APIXABAN 5 MG PO TABS
5.0000 mg | ORAL_TABLET | Freq: Two times a day (BID) | ORAL | Status: DC
  Administered 2018-08-18 – 2018-08-23 (×10): 5 mg via ORAL
  Filled 2018-08-18 (×12): qty 1

## 2018-08-18 MED ORDER — SODIUM CHLORIDE 0.9 % IV SOLN
1000.0000 mL | INTRAVENOUS | Status: DC
  Administered 2018-08-18 – 2018-08-19 (×2): 1000 mL via INTRAVENOUS

## 2018-08-18 MED ORDER — NYSTATIN 100000 UNIT/GM EX POWD
Freq: Three times a day (TID) | CUTANEOUS | Status: DC
  Administered 2018-08-19 – 2018-08-23 (×16): via TOPICAL
  Filled 2018-08-18 (×2): qty 15

## 2018-08-18 MED ORDER — SODIUM CHLORIDE 0.9 % IV BOLUS (SEPSIS)
500.0000 mL | Freq: Once | INTRAVENOUS | Status: AC
  Administered 2018-08-18: 500 mL via INTRAVENOUS

## 2018-08-18 MED ORDER — SODIUM CHLORIDE 0.9 % IV SOLN: 1.0000 g | INTRAVENOUS | Status: DC

## 2018-08-18 MED ORDER — VANCOMYCIN HCL 10 G IV SOLR
1750.0000 mg | Freq: Once | INTRAVENOUS | Status: AC
  Administered 2018-08-18: 1750 mg via INTRAVENOUS
  Filled 2018-08-18: qty 1750

## 2018-08-18 MED ORDER — MEMANTINE HCL 10 MG PO TABS
10.0000 mg | ORAL_TABLET | Freq: Two times a day (BID) | ORAL | Status: DC
  Administered 2018-08-19 – 2018-08-23 (×9): 10 mg via ORAL
  Filled 2018-08-18 (×11): qty 1

## 2018-08-18 MED ORDER — VANCOMYCIN HCL IN DEXTROSE 1-5 GM/200ML-% IV SOLN: 1000.0000 mg | Freq: Once | INTRAVENOUS | Status: DC

## 2018-08-18 MED ORDER — LORAZEPAM 2 MG/ML IJ SOLN
1.0000 mg | Freq: Once | INTRAMUSCULAR | Status: AC
  Administered 2018-08-18: 1 mg via INTRAVENOUS
  Filled 2018-08-18: qty 1

## 2018-08-18 NOTE — H&P (Signed)
History and Physical    ZEFFIE BICKERT Kelley:[id number] DOB: [birth year] DOA: 08/18/2018  PCP: Merri Brunette, MD  Patient coming from: Home  I have personally briefly reviewed patient's old medical records in [facility]  Chief Complaint: Fall  HPI: [name] is a 84 y.o. female with medical history significant of HTN, dementia, AVS.  Patient brought in to ED following a fall at home, hit head in bathroom, she is on eliquis (looks like for DVT / PE back in 2017).  ED Course: CT abd/pelvis neg, found to have Tm 100.5, S.Tach to 130s, improved to 110s after 2.5L IVF bolus.  UA shows UTI with nitrites.  She also has cellulitis / yeast infection to skin folds on torso.   Review of Systems: Unable to perform due to dementia.  Patients biggest concern right now is being able to call her daughter.  Past Medical History:  Diagnosis Date  . Allergic rhinitis   . Aortic valve disorders   . Arthritis   . Dementia (HCC)   . Dyslipidemia   . Glaucoma   . Hypercholesteremia   . Hypertension   . Memory loss   . Obesity   . Vertigo     Past Surgical History:  Procedure Laterality Date  . APPENDECTOMY    . bilataral cataracts removal with lens replacement Dr. Darel Hong    . CARDIAC CATHETERIZATION     normal coronary arteries  . SHOULDER SURGERY       reports that she has never smoked. She has never used smokeless tobacco. She reports that she does not drink alcohol or use drugs.  No Known Allergies  Family History  Problem Relation Age of Onset  . Heart disease Sister   . Stroke Neg Hx   . CAD Neg Hx   . Cancer Neg Hx      Prior to Admission medications   Medication Sig Start Date End Date Taking? Authorizing Provider  acetaminophen (TYLENOL) 325 MG tablet Take 650 mg by mouth 2 (two) times daily.    Yes [provider]  apixaban (ELIQUIS) 5 MG TABS tablet Take 5 mg by mouth 2 (two) times daily.   Yes [provider]  ezetimibe-simvastatin  (VYTORIN) 10-20 MG per tablet Take 1 tablet by mouth daily.    Yes [provider]  furosemide (LASIX) 20 MG tablet Take 10-20 tablets by mouth daily.  05/21/16  Yes [provider]  memantine (NAMENDA) 10 MG tablet Take 1 tablet (10 mg total) by mouth 2 (two) times daily. 01/12/18  Yes Butch Penny, NP  metoprolol succinate (TOPROL-XL) 25 MG 24 hr tablet Take 1 tablet (25 mg total) by mouth daily. 07/17/15  Yes Simmons, Brittainy M, PA-C  polyethylene glycol (MIRALAX / GLYCOLAX) packet Take 17 g by mouth daily as needed.    Yes [provider]  traMADol (ULTRAM) 50 MG tablet Take 50 mg by mouth 2 (two) times daily.  05/05/17  Yes [provider]    Physical Exam: Vitals:   08/18/18 1845 08/18/18 1900 08/18/18 2000 08/18/18 2030  BP: 112/76 (!) 105/52 122/69 101/63  Pulse: (!) 46 (!) 126 (!) 123 (!) 118  Resp: (!) 25 (!) 38 (!) 27 (!) 29  Temp:      TempSrc:      SpO2: 95% 97% 98% 94%  Weight:      Height:        Constitutional: NAD, calm, comfortable Eyes: PERRL, lids and conjunctivae normal ENMT: Mucous  membranes are moist. Posterior pharynx clear of any exudate or lesions.Normal dentition.  Neck: normal, supple, no masses, no thyromegaly Respiratory: clear to auscultation bilaterally, no wheezing, no crackles. Normal respiratory effort. No accessory muscle use.  Cardiovascular: Tachycardic Abdomen: no tenderness, no masses palpated. No hepatosplenomegaly. Bowel sounds positive.  Musculoskeletal: no clubbing / cyanosis. No joint deformity upper and lower extremities. Good ROM, no contractures. Normal muscle tone.  Skin: Erythema and malodorous discharge in skin folds of torso.  Chronic lymphedema changes of BLE. Neurologic: CN 2-12 grossly intact. Sensation intact, DTR normal. Strength 5/5 in all 4.  Psychiatric: Normal judgment and insight. Alert and oriented x 3. Normal mood.    Labs on Admission: I have personally reviewed following labs and  imaging studies  CBC: Recent Labs  Lab 08/18/18 1134  WBC 9.8  HGB 12.2  HCT 40.9  MCV 93.0  PLT 307   Basic Metabolic Panel: Recent Labs  Lab 08/18/18 1134  NA 137  K 4.3  CL 100  CO2 27  GLUCOSE 115*  BUN 14  CREATININE 0.80  CALCIUM 9.0   GFR: Estimated Creatinine Clearance: 45.4 mL/min (by C-G formula based on SCr of 0.8 mg/dL). Liver Function Tests: No results for input(s): AST, ALT, ALKPHOS, BILITOT, PROT, ALBUMIN in the last 168 hours. No results for input(s): LIPASE, AMYLASE in the last 168 hours. No results for input(s): AMMONIA in the last 168 hours. Coagulation Profile: No results for input(s): INR, PROTIME in the last 168 hours. Cardiac Enzymes: No results for input(s): CKTOTAL, CKMB, CKMBINDEX, TROPONINI in the last 168 hours. BNP (last 3 results) No results for input(s): PROBNP in the last 8760 hours. HbA1C: No results for input(s): HGBA1C in the last 72 hours. CBG: No results for input(s): GLUCAP in the last 168 hours. Lipid Profile: No results for input(s): CHOL, HDL, LDLCALC, TRIG, CHOLHDL, LDLDIRECT in the last 72 hours. Thyroid Function Tests: No results for input(s): TSH, T4TOTAL, FREET4, T3FREE, THYROIDAB in the last 72 hours. Anemia Panel: No results for input(s): VITAMINB12, FOLATE, FERRITIN, TIBC, IRON, RETICCTPCT in the last 72 hours. Urine analysis:    Component Value Date/Time   COLORURINE YELLOW 08/18/2018 1538   APPEARANCEUR HAZY (A) 08/18/2018 1538   LABSPEC 1.011 08/18/2018 1538   PHURINE 6.0 08/18/2018 1538   GLUCOSEU NEGATIVE 08/18/2018 1538   HGBUR LARGE (A) 08/18/2018 1538   BILIRUBINUR NEGATIVE 08/18/2018 1538   KETONESUR 5 (A) 08/18/2018 1538   PROTEINUR NEGATIVE 08/18/2018 1538   NITRITE POSITIVE (A) 08/18/2018 1538   LEUKOCYTESUR MODERATE (A) 08/18/2018 1538    Radiological Exams on Admission: Ct Head Wo Contrast  Result Date: 08/18/2018 CLINICAL DATA:  Status post fall, with injury to the head. Patient on  Eliquis. EXAM: CT HEAD WITHOUT CONTRAST TECHNIQUE: Contiguous axial images were obtained from the base of the skull through the vertex without intravenous contrast. COMPARISON:  CT of the head performed 12/17/2015 FINDINGS: Brain: No evidence of acute infarction, hemorrhage, hydrocephalus, extra-axial collection or mass lesion / mass effect. Prominence of the ventricles and sulci reflects mild to moderate cortical volume loss. Scattered periventricular and subcortical white matter change likely reflects small vessel ischemic microangiopathy. The brainstem and fourth ventricle are within normal limits. The basal ganglia are unremarkable in appearance. The cerebral hemispheres demonstrate grossly normal gray-white differentiation. No mass effect or midline shift is seen. Vascular: No hyperdense vessel or unexpected calcification. Skull: There is no evidence of fracture; visualized osseous structures are unremarkable in appearance. Sinuses/Orbits: The orbits are within  normal limits. The paranasal sinuses and mastoid air cells are well-aerated. Other: Soft tissue swelling is noted at the left occiput. IMPRESSION: 1. No evidence of traumatic intracranial injury or fracture. 2. Soft tissue swelling at the left occiput. 3. Mild to moderate cortical volume loss and scattered small vessel ischemic microangiopathy. Electronically Signed   By: Roanna Raider M.D.   On: 08/18/2018 13:40   Ct Abdomen Pelvis W Contrast  Result Date: 08/18/2018 CLINICAL DATA:  Pain after fall.  Abdominal infection. EXAM: CT ABDOMEN AND PELVIS WITH CONTRAST TECHNIQUE: Multidetector CT imaging of the abdomen and pelvis was performed using the standard protocol following bolus administration of intravenous contrast. CONTRAST:  OMNIPAQUE IOHEXOL 300 MG/ML  SOLN COMPARISON:  Pelvic radiographs 11/21/2015 FINDINGS: Lower chest: Top normal heart size without pericardial effusion. Eventration or elevation of left hemidiaphragm partially  obscuring the left lung base and portions of the spleen. Hepatobiliary: Uncomplicated cholelithiasis. No enhancing hepatic mass or laceration. No biliary dilatation. Pancreas: Normal Spleen: Normal Adrenals/Urinary Tract: Adrenal glands are unremarkable. Symmetric cortical enhancement of both kidneys. Nonobstructing right renal pelvic calculus measuring 10 x 7 mm on series 3/26. Subcentimeter hypodensities in the upper and lower pole the left kidney statistically consistent with cysts. These are too small to further characterize. The urinary bladder is unremarkable. Stomach/Bowel: Stomach is within normal limits. Appendix is not well visualized but no pericecal inflammation is identified. No evidence of bowel wall thickening, distention, or inflammatory changes. Vascular/Lymphatic: Moderate aortoiliac atherosclerosis without aneurysm. CT evidence of lymphadenopathy. Reproductive: Calcified uterine fibroid.  No adnexal mass. Other: No free air or free fluid.  No abdominal hernia. Musculoskeletal: Moderate 50% compression deformity of T11 likely remote without retropulsion. Mild inferior endplate Schmorl's node of T12. Degenerative disc disease L3 through S1 with facet arthropathy greatest at L5-S1. Degenerative joint space narrowing of both hips. IMPRESSION: 1. No acute bowel obstruction or inflammation. 2. Fibroid uterus. No hydroureteronephrosis. The urinary bladder is unremarkable. 3. 50% compression deformity of T11 with inferior endplate Schmorl's node of T12 likely chronic without retropulsion noted. 4. Uncomplicated cholelithiasis. 5. Nonobstructing right renal pelvic calculus measuring 10 x 7 mm. Electronically Signed   By: Tollie Eth M.D.   On: 08/18/2018 18:04   Dg Chest Portable 1 View  Result Date: 08/18/2018 CLINICAL DATA:  Larey Seat today. EXAM: PORTABLE CHEST 1 VIEW COMPARISON:  12/17/2015. FINDINGS: Stable enlarged cardiac silhouette and tortuous and mildly calcified thoracic aorta. Stable prominence  of the pulmonary vasculature and interstitial markings. Mildly progressive elevation of the left hemidiaphragm with interval gas in the splenic flexure. Old bilateral shoulder posttraumatic changes, right fixation screw and degenerative changes are again demonstrated. Thoracic spine degenerative changes. IMPRESSION: 1. No acute finding. 2. Stable cardiomegaly, pulmonary vascular congestion and chronic interstitial lung disease. Electronically Signed   By: Beckie Salts M.D.   On: 08/18/2018 15:13   Dg Hips Bilat W Or Wo Pelvis 2 Views  Result Date: 08/18/2018 CLINICAL DATA:  Fall.  Bilateral hip pain. EXAM: DG HIP (WITH OR WITHOUT PELVIS) 2V BILAT COMPARISON:  None. FINDINGS: The hips are located bilaterally. Degenerative changes are worse on the right. No acute fractures are present. Extensive vascular calcifications are present. Calcified fibroid is noted. Degenerative changes are noted in the lumbar spine. IMPRESSION: 1. No acute abnormality. 2. Moderate degenerative changes of the hips, right greater than left. 3. Atherosclerosis. Electronically Signed   By: Marin Roberts M.D.   On: 08/18/2018 12:07    EKG: Independently reviewed.  Assessment/Plan Principal  Problem:   UTI (urinary tract infection) Active Problems:   Aortic stenosis, severe   Essential hypertension   Cellulitis of groin    1. UTI - 1. Rocephin 2. UCx ordered 3. Tylenol PRN fever 4. IVF: got 2.5L bolus in ED and NS at 100 cc/hr continuous. 2. Cellulitis of inginal folds, buttocks, groin - 1. Rocephin 2. Nystatin powder 3. HTN - 1. Holding home metoprolol 2. Holding lasix 4. Severe AS - 1. Overdue for repeat echo (see prior cards notes, they wanted one early 2018) 2. Will go ahead and order for tomorrow.  DVT prophylaxis: Eliquis Code Status: DNR - per pt and advance directive on file Family Communication: Unable to get in touch with daughter, called numbers in chart. Disposition Plan: TBD Consults  called: None Admission status: Place in obs     Naara Kelty, Heywood Iles. DO [facility] Pager [phone] Only works nights!  If 7AM-7PM, please contact the primary day team physician taking care of patient  [email or web address] Password [email or web address]  08/18/2018, 8:50 PM

## 2018-08-18 NOTE — ED Triage Notes (Signed)
Pt brought by EMS falling in bathroom. Pt has memory impairment. Pt at baseline. Pt is on eliquis. Pt did hit head. Unsure LOC.

## 2018-08-18 NOTE — ED Notes (Signed)
The pt has been crying intermittently since I took over her care at 1930  She is asking for her family to be called but the nursing staff and the admitting doctor has called all the numbers on her chart and no one answers  She does not appear to understand  She cries out frequently  For someone to help her  And she just wants her family to be called

## 2018-08-18 NOTE — ED Notes (Signed)
Attempted  To call report   rn will call me back

## 2018-08-18 NOTE — Progress Notes (Signed)
   08/18/18 2243  Vitals  Temp 99.3 F (37.4 C)  Temp Source Oral  BP (!) 131/56  MAP (mmHg) 76  BP Method Automatic  Patient Position (if appropriate) Lying  Pulse Rate (!) 117  Pulse Rate Source Monitor  Resp 18  Oxygen Therapy  SpO2 98 %  O2 Device Room Air  Unable to complete admission.  Patient only alert to self and location.  Memory impairment and no family at bedside.

## 2018-08-18 NOTE — ED Provider Notes (Signed)
[facility] EMERGENCY DEPARTMENT Provider Note   CSN: [id number] Arrival date & time: 08/18/18  1108     History   Chief Complaint Chief Complaint  Patient presents with  . Fall    HPI [name] is a 84 y.o. female.  HPI Patient presented to the emergency room for evaluation after a fall.  According to the EMS report family states the patient fell in her bathroom.  Hit her ahead.  She currently is on Eliquis.  Patient herself does not remember falling.  She tells me she is not sure why she is here but she is having pain and points to her abdomen/pelvic region.  Patient denies any vomiting or diarrhea.  No fevers or chills.  No chest pain or shortness of breath. Past Medical History:  Diagnosis Date  . Allergic rhinitis   . Aortic valve disorders   . Arthritis   . Dementia (HCC)   . Dyslipidemia   . Glaucoma   . Hypercholesteremia   . Hypertension   . Memory loss   . Obesity   . Vertigo     Patient Active Problem List   Diagnosis Date Noted  . Lower leg DVT (deep venous thrombosis), right 12/26/2015  . Generalized weakness 12/26/2015  . Pulmonary embolism without acute cor pulmonale (HCC) 12/26/2015  . Humerus fracture 12/18/2015  . Pulmonary embolism (HCC) 12/18/2015  . Acute encephalopathy   . Fracture of humerus, left, closed 12/17/2015  . SAH (subarachnoid hemorrhage) (HCC) 11/16/2015  . Diastolic dysfunction-grade 2 11/16/2015  . Wrist fracture 11/14/2015  . Fall 11/14/2015  . Essential hypertension   . Hypercholesteremia   . Aortic stenosis, severe   . Allergic rhinitis   . Arthritis   . Dyslipidemia   . Obesity     Past Surgical History:  Procedure Laterality Date  . APPENDECTOMY    . bilataral cataracts removal with lens replacement Dr. Darel Hong    . CARDIAC CATHETERIZATION     normal coronary arteries  . SHOULDER SURGERY       OB History   None      Home Medications    Prior to Admission medications     Medication Sig Start Date End Date Taking? Authorizing Provider  acetaminophen (TYLENOL) 325 MG tablet Take 650 mg by mouth 2 (two) times daily.    Yes [provider]  apixaban (ELIQUIS) 5 MG TABS tablet Take 5 mg by mouth 2 (two) times daily.   Yes [provider]  ezetimibe-simvastatin (VYTORIN) 10-20 MG per tablet Take 1 tablet by mouth daily.    Yes [provider]  furosemide (LASIX) 20 MG tablet Take 10-20 tablets by mouth daily.  05/21/16  Yes [provider]  memantine (NAMENDA) 10 MG tablet Take 1 tablet (10 mg total) by mouth 2 (two) times daily. 01/12/18  Yes Butch Penny, NP  metoprolol succinate (TOPROL-XL) 25 MG 24 hr tablet Take 1 tablet (25 mg total) by mouth daily. 07/17/15  Yes Simmons, Brittainy M, PA-C  polyethylene glycol (MIRALAX / GLYCOLAX) packet Take 17 g by mouth daily as needed.    Yes [provider]  traMADol (ULTRAM) 50 MG tablet Take 50 mg by mouth 2 (two) times daily.  05/05/17  Yes [provider]    Family History Family History  Problem Relation Age of Onset  . Heart disease Sister   . Stroke Neg Hx   . CAD Neg Hx   . Cancer Neg Hx  Social History Social History   Tobacco Use  . Smoking status: Never Smoker  . Smokeless tobacco: Never Used  Substance Use Topics  . Alcohol use: No    Alcohol/week: 0.0 standard drinks  . Drug use: No     Allergies   Patient has no known allergies.   Review of Systems Review of Systems  All other systems reviewed and are negative.    Physical Exam Updated Vital Signs BP (!) 115/52   Pulse (!) 131   Temp (S) (!) 100.5 F (38.1 C) (Rectal)   Resp (!) 25   SpO2 96%   Physical Exam  Constitutional: No distress.  Malodorous, disheveled  HENT:  Head: Normocephalic and atraumatic.  Right Ear: External ear normal.  Left Ear: External ear normal.  Eyes: Conjunctivae are normal. Right eye exhibits no discharge. Left eye exhibits no discharge. No  scleral icterus.  Neck: Neck supple. No tracheal deviation present.  Cardiovascular: Normal rate, regular rhythm and intact distal pulses.  Pulmonary/Chest: Effort normal and breath sounds normal. No stridor. No respiratory distress. She has no wheezes. She has no rales.  Abdominal: Soft. Bowel sounds are normal. She exhibits no distension. There is tenderness in the right lower quadrant. There is no rebound and no guarding.  Genitourinary:  Genitourinary Comments: Depends undergarment appears old  Musculoskeletal: She exhibits edema. She exhibits no tenderness.  Edema bilaterally, skin is hypertrophic, chronic lymphedema changes  Neurological: She is alert. She has normal strength. No cranial nerve deficit (no facial droop, extraocular movements intact, no slurred speech) or sensory deficit. She exhibits normal muscle tone. She displays no seizure activity. Coordination normal.  Skin: Skin is warm and dry. Rash noted. She is not diaphoretic.  Patient has erythema, and malodorous discharge in the skin folds beneath her breasts, pannus, Juengel and buttock regions, evidence of poor hygiene  Psychiatric: She has a normal mood and affect.  Nursing note and vitals reviewed.    ED Treatments / Results  Labs (all labs ordered are listed, but only abnormal results are displayed) Labs Reviewed  CBC - Abnormal; Notable for the following components:      Result Value   MCHC 29.8 (*)    All other components within normal limits  BASIC METABOLIC PANEL - Abnormal; Notable for the following components:   Glucose, Bld 115 (*)    All other components within normal limits  CULTURE, BLOOD (ROUTINE X 2)  CULTURE, BLOOD (ROUTINE X 2)  URINALYSIS, ROUTINE W REFLEX MICROSCOPIC  I-STAT TROPONIN, ED  I-STAT CG4 LACTIC ACID, ED    EKG EKG Interpretation  Date/Time:  Wednesday August 18 2018 13:52:55 EDT Ventricular Rate:  124 PR Interval:    QRS Duration: 90 QT Interval:  315 QTC  Calculation: 453 R Axis:   30 Text Interpretation:  Sinus tachycardia Probable left atrial enlargement Repolarization abnormality, prob rate related Artifact in lead(s) I III aVR aVL V1 Since last tracing rate faster Confirmed by Linwood Dibbles [phone]) on 08/18/2018 2:44:20 PM   Radiology Ct Head Wo Contrast  Result Date: 08/18/2018 CLINICAL DATA:  Status post fall, with injury to the head. Patient on Eliquis. EXAM: CT HEAD WITHOUT CONTRAST TECHNIQUE: Contiguous axial images were obtained from the base of the skull through the vertex without intravenous contrast. COMPARISON:  CT of the head performed 12/17/2015 FINDINGS: Brain: No evidence of acute infarction, hemorrhage, hydrocephalus, extra-axial collection or mass lesion / mass effect. Prominence of the ventricles and sulci reflects mild to moderate cortical volume  loss. Scattered periventricular and subcortical white matter change likely reflects small vessel ischemic microangiopathy. The brainstem and fourth ventricle are within normal limits. The basal ganglia are unremarkable in appearance. The cerebral hemispheres demonstrate grossly normal gray-white differentiation. No mass effect or midline shift is seen. Vascular: No hyperdense vessel or unexpected calcification. Skull: There is no evidence of fracture; visualized osseous structures are unremarkable in appearance. Sinuses/Orbits: The orbits are within normal limits. The paranasal sinuses and mastoid air cells are well-aerated. Other: Soft tissue swelling is noted at the left occiput. IMPRESSION: 1. No evidence of traumatic intracranial injury or fracture. 2. Soft tissue swelling at the left occiput. 3. Mild to moderate cortical volume loss and scattered small vessel ischemic microangiopathy. Electronically Signed   By: Roanna Raider M.D.   On: 08/18/2018 13:40   Dg Chest Portable 1 View  Result Date: 08/18/2018 CLINICAL DATA:  Larey Seat today. EXAM: PORTABLE CHEST 1 VIEW COMPARISON:  12/17/2015.  FINDINGS: Stable enlarged cardiac silhouette and tortuous and mildly calcified thoracic aorta. Stable prominence of the pulmonary vasculature and interstitial markings. Mildly progressive elevation of the left hemidiaphragm with interval gas in the splenic flexure. Old bilateral shoulder posttraumatic changes, right fixation screw and degenerative changes are again demonstrated. Thoracic spine degenerative changes. IMPRESSION: 1. No acute finding. 2. Stable cardiomegaly, pulmonary vascular congestion and chronic interstitial lung disease. Electronically Signed   By: Beckie Salts M.D.   On: 08/18/2018 15:13   Dg Hips Bilat W Or Wo Pelvis 2 Views  Result Date: 08/18/2018 CLINICAL DATA:  Fall.  Bilateral hip pain. EXAM: DG HIP (WITH OR WITHOUT PELVIS) 2V BILAT COMPARISON:  None. FINDINGS: The hips are located bilaterally. Degenerative changes are worse on the right. No acute fractures are present. Extensive vascular calcifications are present. Calcified fibroid is noted. Degenerative changes are noted in the lumbar spine. IMPRESSION: 1. No acute abnormality. 2. Moderate degenerative changes of the hips, right greater than left. 3. Atherosclerosis. Electronically Signed   By: Marin Roberts M.D.   On: 08/18/2018 12:07    Procedures Procedures (including critical care time)  Medications Ordered in ED Medications  sodium chloride 0.9 % bolus 500 mL (0 mLs Intravenous Stopped 08/18/18 1552)    Followed by  0.9 %  sodium chloride infusion (1,000 mLs Intravenous New Bag/Given 08/18/18 1500)  vancomycin (VANCOCIN) IVPB 1000 mg/200 mL premix (has no administration in time range)  cefTRIAXone (ROCEPHIN) 2 g in sodium chloride 0.9 % 100 mL IVPB (has no administration in time range)  sodium chloride 0.9 % bolus 1,000 mL (has no administration in time range)    And  sodium chloride 0.9 % bolus 1,000 mL (has no administration in time range)    And  sodium chloride 0.9 % bolus 500 mL (has no  administration in time range)     Initial Impression / Assessment and Plan / ED Course  I have reviewed the triage vital signs and the nursing notes.  Pertinent labs & imaging results that were available during my care of the patient were reviewed by me and considered in my medical decision making (see chart for details).  Clinical Course as of Aug 18 1618  Wed Aug 18, 2018  1550 Pt noted to be tachycardic.  IV fluids ordered.  Initial labs unremarkable.    [JK]    Clinical Course User Index [JK] Linwood Dibbles, MD   PT presented to the ED after a fall.  On exam pt was noted to have several areas of skin  breakdown and moist erythematous tissue in the skin folds. Suspect yeast and cellulitis.  Pt noted to be tachycardic although BP has been stable.  STarted on abx.  Anticipate admission for further treatment.  Dr Rush Landmark will follow up on CT scan  Final Clinical Impressions(s) / ED Diagnoses   Final diagnoses:  Cellulitis, unspecified cellulitis site      Linwood Dibbles, MD 08/18/18 [phone]

## 2018-08-18 NOTE — Progress Notes (Signed)
Pharmacy Antibiotic Note  STACHIA SLUTSKY is a 84 y.o. female admitted on 08/18/2018 with cellulitis.  Pt presenting for evaluation after having a fall. Per MD note, pt noted to have several areas of skin breakdown and moist erythematous tissue in skin folds. Pharmacy has been consulted for Vancomycin dosing. Tmax - 100.5, WBC - 9.8; Scr-0.8  Plan: Vancomycin 1750mg  IV x1, then Vancomycin 1g IV q24h Ceftriaxone 2g IV q24h per MD Monitor and adjust per renal fx, clinical status, C&S     Temp (24hrs), Avg:100.5 F (38.1 C), Min:100.5 F (38.1 C), Max:100.5 F (38.1 C)  Recent Labs  Lab 08/18/18 1134  WBC 9.8  CREATININE 0.80    CrCl cannot be calculated (Unknown ideal weight.).    No Known Allergies  Antimicrobials this admission: Ceftriaxone 10/30 >>  Vancomycin 10/30 >>   Dose adjustments this admission: N/A  Microbiology results: Pending  Thank you for allowing pharmacy to be a part of this patient's care.  Koleen Nimrod 08/18/2018 4:15 PM

## 2018-08-19 ENCOUNTER — Ambulatory Visit (HOSPITAL_COMMUNITY): Payer: Medicare Other

## 2018-08-19 DIAGNOSIS — Z7901 Long term (current) use of anticoagulants: Secondary | ICD-10-CM | POA: Diagnosis not present

## 2018-08-19 DIAGNOSIS — F05 Delirium due to known physiological condition: Secondary | ICD-10-CM | POA: Diagnosis present

## 2018-08-19 DIAGNOSIS — N390 Urinary tract infection, site not specified: Secondary | ICD-10-CM | POA: Diagnosis present

## 2018-08-19 DIAGNOSIS — L304 Erythema intertrigo: Secondary | ICD-10-CM | POA: Diagnosis present

## 2018-08-19 DIAGNOSIS — G20 Parkinson's disease: Secondary | ICD-10-CM | POA: Diagnosis not present

## 2018-08-19 DIAGNOSIS — L89899 Pressure ulcer of other site, unspecified stage: Secondary | ICD-10-CM | POA: Diagnosis present

## 2018-08-19 DIAGNOSIS — I351 Nonrheumatic aortic (valve) insufficiency: Secondary | ICD-10-CM

## 2018-08-19 DIAGNOSIS — L26 Exfoliative dermatitis: Secondary | ICD-10-CM | POA: Diagnosis present

## 2018-08-19 DIAGNOSIS — B372 Candidiasis of skin and nail: Secondary | ICD-10-CM | POA: Diagnosis not present

## 2018-08-19 DIAGNOSIS — E872 Acidosis: Secondary | ICD-10-CM | POA: Diagnosis present

## 2018-08-19 DIAGNOSIS — L03314 Cellulitis of groin: Secondary | ICD-10-CM | POA: Diagnosis present

## 2018-08-19 DIAGNOSIS — Z8249 Family history of ischemic heart disease and other diseases of the circulatory system: Secondary | ICD-10-CM | POA: Diagnosis not present

## 2018-08-19 DIAGNOSIS — B9620 Unspecified Escherichia coli [E. coli] as the cause of diseases classified elsewhere: Secondary | ICD-10-CM | POA: Diagnosis present

## 2018-08-19 DIAGNOSIS — R6510 Systemic inflammatory response syndrome (SIRS) of non-infectious origin without acute organ dysfunction: Secondary | ICD-10-CM | POA: Diagnosis present

## 2018-08-19 DIAGNOSIS — I350 Nonrheumatic aortic (valve) stenosis: Secondary | ICD-10-CM | POA: Diagnosis present

## 2018-08-19 DIAGNOSIS — Z86718 Personal history of other venous thrombosis and embolism: Secondary | ICD-10-CM | POA: Diagnosis not present

## 2018-08-19 DIAGNOSIS — E669 Obesity, unspecified: Secondary | ICD-10-CM | POA: Diagnosis present

## 2018-08-19 DIAGNOSIS — R451 Restlessness and agitation: Secondary | ICD-10-CM | POA: Diagnosis present

## 2018-08-19 DIAGNOSIS — E7800 Pure hypercholesterolemia, unspecified: Secondary | ICD-10-CM | POA: Diagnosis present

## 2018-08-19 DIAGNOSIS — Z66 Do not resuscitate: Secondary | ICD-10-CM | POA: Diagnosis present

## 2018-08-19 DIAGNOSIS — N3001 Acute cystitis with hematuria: Secondary | ICD-10-CM | POA: Diagnosis not present

## 2018-08-19 DIAGNOSIS — S0990XA Unspecified injury of head, initial encounter: Secondary | ICD-10-CM | POA: Diagnosis present

## 2018-08-19 DIAGNOSIS — Z86711 Personal history of pulmonary embolism: Secondary | ICD-10-CM | POA: Diagnosis not present

## 2018-08-19 DIAGNOSIS — F0391 Unspecified dementia with behavioral disturbance: Secondary | ICD-10-CM | POA: Diagnosis present

## 2018-08-19 DIAGNOSIS — W19XXXA Unspecified fall, initial encounter: Secondary | ICD-10-CM | POA: Diagnosis present

## 2018-08-19 DIAGNOSIS — R109 Unspecified abdominal pain: Secondary | ICD-10-CM | POA: Diagnosis present

## 2018-08-19 DIAGNOSIS — Z79891 Long term (current) use of opiate analgesic: Secondary | ICD-10-CM | POA: Diagnosis not present

## 2018-08-19 DIAGNOSIS — I10 Essential (primary) hypertension: Secondary | ICD-10-CM | POA: Diagnosis present

## 2018-08-19 DIAGNOSIS — Z79899 Other long term (current) drug therapy: Secondary | ICD-10-CM | POA: Diagnosis not present

## 2018-08-19 DIAGNOSIS — Y92002 Bathroom of unspecified non-institutional (private) residence single-family (private) house as the place of occurrence of the external cause: Secondary | ICD-10-CM | POA: Diagnosis not present

## 2018-08-19 DIAGNOSIS — E785 Hyperlipidemia, unspecified: Secondary | ICD-10-CM | POA: Diagnosis present

## 2018-08-19 LAB — CBC
HEMATOCRIT: 29.9 % — AB (ref 36.0–46.0)
Hemoglobin: 9.2 g/dL — ABNORMAL LOW (ref 12.0–15.0)
MCH: 27.8 pg (ref 26.0–34.0)
MCHC: 30.8 g/dL (ref 30.0–36.0)
MCV: 90.3 fL (ref 80.0–100.0)
NRBC: 0 % (ref 0.0–0.2)
PLATELETS: 220 10*3/uL (ref 150–400)
RBC: 3.31 MIL/uL — ABNORMAL LOW (ref 3.87–5.11)
RDW: 15.7 % — AB (ref 11.5–15.5)
WBC: 6.9 10*3/uL (ref 4.0–10.5)

## 2018-08-19 LAB — BASIC METABOLIC PANEL
ANION GAP: 4 — AB (ref 5–15)
BUN: 8 mg/dL (ref 8–23)
CO2: 21 mmol/L — AB (ref 22–32)
Calcium: 7.3 mg/dL — ABNORMAL LOW (ref 8.9–10.3)
Chloride: 114 mmol/L — ABNORMAL HIGH (ref 98–111)
Creatinine, Ser: 0.64 mg/dL (ref 0.44–1.00)
GFR calc Af Amer: >60 mL/min (ref >60)
GFR calc non Af Amer: >60 mL/min (ref >60)
GLUCOSE: 80 mg/dL (ref 70–99)
Potassium: 3.5 mmol/L (ref 3.5–5.1)
Sodium: 139 mmol/L (ref 135–145)

## 2018-08-19 LAB — ECHOCARDIOGRAM COMPLETE
Height: 61 in
Weight: 2912 oz

## 2018-08-19 MED ORDER — PRO-STAT SUGAR FREE PO LIQD
30.0000 mL | Freq: Two times a day (BID) | ORAL | Status: DC
  Administered 2018-08-19 – 2018-08-23 (×8): 30 mL via ORAL
  Filled 2018-08-19 (×9): qty 30

## 2018-08-19 MED ORDER — ADULT MULTIVITAMIN W/MINERALS CH
1.0000 | ORAL_TABLET | Freq: Every day | ORAL | Status: DC
  Administered 2018-08-19 – 2018-08-23 (×4): 1 via ORAL
  Filled 2018-08-19 (×5): qty 1

## 2018-08-19 MED ORDER — HYDROCERIN EX CREA
TOPICAL_CREAM | Freq: Every day | CUTANEOUS | Status: DC
  Administered 2018-08-19 – 2018-08-24 (×8): via TOPICAL
  Filled 2018-08-19: qty 113

## 2018-08-19 MED ORDER — POTASSIUM CHLORIDE IN NACL 20-0.9 MEQ/L-% IV SOLN
INTRAVENOUS | Status: DC
  Administered 2018-08-19 – 2018-08-21 (×4): via INTRAVENOUS
  Filled 2018-08-19 (×3): qty 1000

## 2018-08-19 MED ORDER — HYDROCERIN EX CREA
TOPICAL_CREAM | Freq: Two times a day (BID) | CUTANEOUS | Status: DC
  Filled 2018-08-19: qty 113

## 2018-08-19 NOTE — Progress Notes (Addendum)
[facility] PROGRESS NOTE  [name] UJW:[id number] DOB: 1924-04-24 DOA: 08/18/2018   PCP: Merri Brunette, MD     Assessment/Plan: Principal Problem:   UTI (urinary tract infection) Active Problems:   Aortic stenosis, severe   Essential hypertension   Cellulitis of groin  84 y.o. female with medical history significant of HTN, dementia, AVS.  Patient brought in to ED following a fall at home, hit head in bathroom, she is on eliquis (looks like for DVT / PE back in 2017).fund of UTI, cellulitis. Patient found to be disheveled and not taken care off. May need placement  Assessment and plan UTI Currently on Rocephin Follow urine culture closely Continue gentle hydration, lactic acidosis resolved  Exfoliative dermatitis/cellulitis/intertrigo Patient found to be unkempt, foul-smelling odor from both legs and her entire body I do not see any body infestation such as scabies/lice Wound care consultation has been ordered   Dementia Patient states that she is taken care of by her 2 daughters, however she is disoriented to time place and person, history is unreliable Spoke to one of the daughter's who visited today, apparently she has been taking care of her mother with the help of her husband for the last 3 years  She states it has been hard for them to take her to her Dr 's appointments  They could consider longterm placement vs SNF for wound care /abx Social worker consultation has been ordered, will likely need placement Continue [facility] PT OT eval  delirium likely the setting of UTI/low-grade fever/Underlying dementia Continue to provide reorientation cues Received Ativan 1 mg 1 at 11pm, which calmed her down ,   History of DVT/PE Continue Eliquis if patient is being discharged to SNF      DVT prophylaxsis Eliquis  Code Status:  DO NOT RESUSCITATE     Family Communication: Discussed in detail with the patient, all imaging results, lab results  explained to the patient   Disposition Plan:  Several  days, may need placement       Consultants:  Wound care  Procedures:  none  Antibiotics: Anti-infectives (From admission, onward)   Start     Dose/Rate Route Frequency Ordered Stop   08/19/18 1730  vancomycin (VANCOCIN) IVPB 1000 mg/200 mL premix  Status:  Discontinued     1,000 mg 200 mL/hr over 60 Minutes Intravenous Every 24 hours 08/18/18 1635 08/18/18 2037   08/19/18 1615  cefTRIAXone (ROCEPHIN) 1 g in sodium chloride 0.9 % 100 mL IVPB  Status:  Discontinued     1 g 200 mL/hr over 30 Minutes Intravenous Every 24 hours 08/18/18 2035 08/18/18 2036   08/19/18 1615  cefTRIAXone (ROCEPHIN) 1 g in sodium chloride 0.9 % 100 mL IVPB     1 g 200 mL/hr over 30 Minutes Intravenous Every 24 hours 08/18/18 2036     08/18/18 1630  vancomycin (VANCOCIN) 1,750 mg in sodium chloride 0.9 % 500 mL IVPB     1,750 mg 250 mL/hr over 120 Minutes Intravenous  Once 08/18/18 1622 08/18/18 2040   08/18/18 1615  vancomycin (VANCOCIN) IVPB 1000 mg/200 mL premix  Status:  Discontinued     1,000 mg 200 mL/hr over 60 Minutes Intravenous  Once 08/18/18 1613 08/18/18 1622   08/18/18 1615  cefTRIAXone (ROCEPHIN) 2 g in sodium chloride 0.9 % 100 mL IVPB  Status:  Discontinued     2 g 200 mL/hr over 30 Minutes Intravenous Every 24 hours 08/18/18 1614 08/18/18 2035  HPI/Subjective: Patient  Was agitated yesterday and last night, unable to provide any meaningful history  Objective: Vitals:   08/18/18 2130 08/18/18 2243 08/19/18 0024 08/19/18 0434  BP: (!) 112/93 (!) 131/56  112/60  Pulse: (!) 121 (!) 117 (!) 102 90  Resp:  18  17  Temp:  99.3 F (37.4 C)  98.6 F (37 C)  TempSrc:  Oral  Oral  SpO2: 99% 98%  98%  Weight:    82.6 kg  Height:        Intake/Output Summary (Last 24 hours) at 08/19/2018 0953 Last data filed at 08/19/2018 0700 Gross per 24 hour  Intake 2696.88 ml  Output 1100 ml  Net 1596.88 ml     Exam:  Examination:  General exam: somnolent but arousable Respiratory system: Clear to auscultation. Respiratory effort normal. Cardiovascular system: S1 & S2 heard, RRR. No JVD, murmurs, rubs, gallops or clicks. No pedal edema. Gastrointestinal system: Abdomen is nondistended, soft and nontender. No organomegaly or masses felt. Normal bowel sounds heard. Central nervous system: confused Extremities: Symmetric 5 x 5 power. Skin:  Exfoliative dermatitis on bilateral lower extremities, noted to have intertrigo, under the breast,  Inguinal folds Psychiatry: impaired judgment    Data Reviewed: I have personally reviewed following labs and imaging studies  Micro Results No results found for this or any previous visit (from the past 240 hour(s)).  Radiology Reports Ct Head Wo Contrast  Result Date: 08/18/2018 CLINICAL DATA:  Status post fall, with injury to the head. Patient on Eliquis. EXAM: CT HEAD WITHOUT CONTRAST TECHNIQUE: Contiguous axial images were obtained from the base of the skull through the vertex without intravenous contrast. COMPARISON:  CT of the head performed 12/17/2015 FINDINGS: Brain: No evidence of acute infarction, hemorrhage, hydrocephalus, extra-axial collection or mass lesion / mass effect. Prominence of the ventricles and sulci reflects mild to moderate cortical volume loss. Scattered periventricular and subcortical white matter change likely reflects small vessel ischemic microangiopathy. The brainstem and fourth ventricle are within normal limits. The basal ganglia are unremarkable in appearance. The cerebral hemispheres demonstrate grossly normal gray-white differentiation. No mass effect or midline shift is seen. Vascular: No hyperdense vessel or unexpected calcification. Skull: There is no evidence of fracture; visualized osseous structures are unremarkable in appearance. Sinuses/Orbits: The orbits are within normal limits. The paranasal sinuses and mastoid air  cells are well-aerated. Other: Soft tissue swelling is noted at the left occiput. IMPRESSION: 1. No evidence of traumatic intracranial injury or fracture. 2. Soft tissue swelling at the left occiput. 3. Mild to moderate cortical volume loss and scattered small vessel ischemic microangiopathy. Electronically Signed   By: Roanna Raider M.D.   On: 08/18/2018 13:40   Ct Abdomen Pelvis W Contrast  Result Date: 08/18/2018 CLINICAL DATA:  Pain after fall.  Abdominal infection. EXAM: CT ABDOMEN AND PELVIS WITH CONTRAST TECHNIQUE: Multidetector CT imaging of the abdomen and pelvis was performed using the standard protocol following bolus administration of intravenous contrast. CONTRAST:  OMNIPAQUE IOHEXOL 300 MG/ML  SOLN COMPARISON:  Pelvic radiographs 11/21/2015 FINDINGS: Lower chest: Top normal heart size without pericardial effusion. Eventration or elevation of left hemidiaphragm partially obscuring the left lung base and portions of the spleen. Hepatobiliary: Uncomplicated cholelithiasis. No enhancing hepatic mass or laceration. No biliary dilatation. Pancreas: Normal Spleen: Normal Adrenals/Urinary Tract: Adrenal glands are unremarkable. Symmetric cortical enhancement of both kidneys. Nonobstructing right renal pelvic calculus measuring 10 x 7 mm on series 3/26. Subcentimeter hypodensities in the upper and lower pole  the left kidney statistically consistent with cysts. These are too small to further characterize. The urinary bladder is unremarkable. Stomach/Bowel: Stomach is within normal limits. Appendix is not well visualized but no pericecal inflammation is identified. No evidence of bowel wall thickening, distention, or inflammatory changes. Vascular/Lymphatic: Moderate aortoiliac atherosclerosis without aneurysm. CT evidence of lymphadenopathy. Reproductive: Calcified uterine fibroid.  No adnexal mass. Other: No free air or free fluid.  No abdominal hernia. Musculoskeletal: Moderate 50% compression  deformity of T11 likely remote without retropulsion. Mild inferior endplate Schmorl's node of T12. Degenerative disc disease L3 through S1 with facet arthropathy greatest at L5-S1. Degenerative joint space narrowing of both hips. IMPRESSION: 1. No acute bowel obstruction or inflammation. 2. Fibroid uterus. No hydroureteronephrosis. The urinary bladder is unremarkable. 3. 50% compression deformity of T11 with inferior endplate Schmorl's node of T12 likely chronic without retropulsion noted. 4. Uncomplicated cholelithiasis. 5. Nonobstructing right renal pelvic calculus measuring 10 x 7 mm. Electronically Signed   By: Tollie Eth M.D.   On: 08/18/2018 18:04   Dg Chest Portable 1 View  Result Date: 08/18/2018 CLINICAL DATA:  Larey Seat today. EXAM: PORTABLE CHEST 1 VIEW COMPARISON:  12/17/2015. FINDINGS: Stable enlarged cardiac silhouette and tortuous and mildly calcified thoracic aorta. Stable prominence of the pulmonary vasculature and interstitial markings. Mildly progressive elevation of the left hemidiaphragm with interval gas in the splenic flexure. Old bilateral shoulder posttraumatic changes, right fixation screw and degenerative changes are again demonstrated. Thoracic spine degenerative changes. IMPRESSION: 1. No acute finding. 2. Stable cardiomegaly, pulmonary vascular congestion and chronic interstitial lung disease. Electronically Signed   By: Beckie Salts M.D.   On: 08/18/2018 15:13   Dg Hips Bilat W Or Wo Pelvis 2 Views  Result Date: 08/18/2018 CLINICAL DATA:  Fall.  Bilateral hip pain. EXAM: DG HIP (WITH OR WITHOUT PELVIS) 2V BILAT COMPARISON:  None. FINDINGS: The hips are located bilaterally. Degenerative changes are worse on the right. No acute fractures are present. Extensive vascular calcifications are present. Calcified fibroid is noted. Degenerative changes are noted in the lumbar spine. IMPRESSION: 1. No acute abnormality. 2. Moderate degenerative changes of the hips, right greater than left.  3. Atherosclerosis. Electronically Signed   By: Marin Roberts M.D.   On: 08/18/2018 12:07     CBC Recent Labs  Lab 08/18/18 1134 08/19/18 0607  WBC 9.8 6.9  HGB 12.2 9.2*  HCT 40.9 29.9*  PLT 307 220  MCV 93.0 90.3  MCH 27.7 27.8  MCHC 29.8* 30.8  RDW 15.3 15.7*    Chemistries  Recent Labs  Lab 08/18/18 1134 08/18/18 2029 08/19/18 0607  NA 137  --  139  K 4.3  --  3.5  CL 100  --  114*  CO2 27  --  21*  GLUCOSE 115*  --  80  BUN 14  --  8  CREATININE 0.80  --  0.64  CALCIUM 9.0  --  7.3*  AST  --  19  --   ALT  --  10  --   ALKPHOS  --  83  --   BILITOT  --  0.6  --    ------------------------------------------------------------------------------------------------------------------ estimated creatinine clearance is 43.7 mL/min (by C-G formula based on SCr of 0.64 mg/dL). ------------------------------------------------------------------------------------------------------------------ No results for input(s): HGBA1C in the last 72 hours. ------------------------------------------------------------------------------------------------------------------ No results for input(s): CHOL, HDL, LDLCALC, TRIG, CHOLHDL, LDLDIRECT in the last 72 hours. ------------------------------------------------------------------------------------------------------------------ No results for input(s): TSH, T4TOTAL, T3FREE, THYROIDAB in the last 72 hours.  Invalid  input(s): FREET3 ------------------------------------------------------------------------------------------------------------------ No results for input(s): VITAMINB12, FOLATE, FERRITIN, TIBC, IRON, RETICCTPCT in the last 72 hours.  Coagulation profile No results for input(s): INR, PROTIME in the last 168 hours.  No results for input(s): DDIMER in the last 72 hours.  Cardiac Enzymes No results for input(s): CKMB, TROPONINI, MYOGLOBIN in the last 168 hours.  Invalid input(s):  CK ------------------------------------------------------------------------------------------------------------------ Invalid input(s): POCBNP   CBG: No results for input(s): GLUCAP in the last 168 hours.     Studies: Ct Head Wo Contrast  Result Date: 08/18/2018 CLINICAL DATA:  Status post fall, with injury to the head. Patient on Eliquis. EXAM: CT HEAD WITHOUT CONTRAST TECHNIQUE: Contiguous axial images were obtained from the base of the skull through the vertex without intravenous contrast. COMPARISON:  CT of the head performed 12/17/2015 FINDINGS: Brain: No evidence of acute infarction, hemorrhage, hydrocephalus, extra-axial collection or mass lesion / mass effect. Prominence of the ventricles and sulci reflects mild to moderate cortical volume loss. Scattered periventricular and subcortical white matter change likely reflects small vessel ischemic microangiopathy. The brainstem and fourth ventricle are within normal limits. The basal ganglia are unremarkable in appearance. The cerebral hemispheres demonstrate grossly normal gray-white differentiation. No mass effect or midline shift is seen. Vascular: No hyperdense vessel or unexpected calcification. Skull: There is no evidence of fracture; visualized osseous structures are unremarkable in appearance. Sinuses/Orbits: The orbits are within normal limits. The paranasal sinuses and mastoid air cells are well-aerated. Other: Soft tissue swelling is noted at the left occiput. IMPRESSION: 1. No evidence of traumatic intracranial injury or fracture. 2. Soft tissue swelling at the left occiput. 3. Mild to moderate cortical volume loss and scattered small vessel ischemic microangiopathy. Electronically Signed   By: Roanna Raider M.D.   On: 08/18/2018 13:40   Ct Abdomen Pelvis W Contrast  Result Date: 08/18/2018 CLINICAL DATA:  Pain after fall.  Abdominal infection. EXAM: CT ABDOMEN AND PELVIS WITH CONTRAST TECHNIQUE: Multidetector CT imaging of the  abdomen and pelvis was performed using the standard protocol following bolus administration of intravenous contrast. CONTRAST:  OMNIPAQUE IOHEXOL 300 MG/ML  SOLN COMPARISON:  Pelvic radiographs 11/21/2015 FINDINGS: Lower chest: Top normal heart size without pericardial effusion. Eventration or elevation of left hemidiaphragm partially obscuring the left lung base and portions of the spleen. Hepatobiliary: Uncomplicated cholelithiasis. No enhancing hepatic mass or laceration. No biliary dilatation. Pancreas: Normal Spleen: Normal Adrenals/Urinary Tract: Adrenal glands are unremarkable. Symmetric cortical enhancement of both kidneys. Nonobstructing right renal pelvic calculus measuring 10 x 7 mm on series 3/26. Subcentimeter hypodensities in the upper and lower pole the left kidney statistically consistent with cysts. These are too small to further characterize. The urinary bladder is unremarkable. Stomach/Bowel: Stomach is within normal limits. Appendix is not well visualized but no pericecal inflammation is identified. No evidence of bowel wall thickening, distention, or inflammatory changes. Vascular/Lymphatic: Moderate aortoiliac atherosclerosis without aneurysm. CT evidence of lymphadenopathy. Reproductive: Calcified uterine fibroid.  No adnexal mass. Other: No free air or free fluid.  No abdominal hernia. Musculoskeletal: Moderate 50% compression deformity of T11 likely remote without retropulsion. Mild inferior endplate Schmorl's node of T12. Degenerative disc disease L3 through S1 with facet arthropathy greatest at L5-S1. Degenerative joint space narrowing of both hips. IMPRESSION: 1. No acute bowel obstruction or inflammation. 2. Fibroid uterus. No hydroureteronephrosis. The urinary bladder is unremarkable. 3. 50% compression deformity of T11 with inferior endplate Schmorl's node of T12 likely chronic without retropulsion noted. 4. Uncomplicated cholelithiasis. 5. Nonobstructing right renal pelvic  calculus measuring 10 x 7 mm. Electronically Signed   By: Tollie Eth M.D.   On: 08/18/2018 18:04   Dg Chest Portable 1 View  Result Date: 08/18/2018 CLINICAL DATA:  Larey Seat today. EXAM: PORTABLE CHEST 1 VIEW COMPARISON:  12/17/2015. FINDINGS: Stable enlarged cardiac silhouette and tortuous and mildly calcified thoracic aorta. Stable prominence of the pulmonary vasculature and interstitial markings. Mildly progressive elevation of the left hemidiaphragm with interval gas in the splenic flexure. Old bilateral shoulder posttraumatic changes, right fixation screw and degenerative changes are again demonstrated. Thoracic spine degenerative changes. IMPRESSION: 1. No acute finding. 2. Stable cardiomegaly, pulmonary vascular congestion and chronic interstitial lung disease. Electronically Signed   By: Beckie Salts M.D.   On: 08/18/2018 15:13   Dg Hips Bilat W Or Wo Pelvis 2 Views  Result Date: 08/18/2018 CLINICAL DATA:  Fall.  Bilateral hip pain. EXAM: DG HIP (WITH OR WITHOUT PELVIS) 2V BILAT COMPARISON:  None. FINDINGS: The hips are located bilaterally. Degenerative changes are worse on the right. No acute fractures are present. Extensive vascular calcifications are present. Calcified fibroid is noted. Degenerative changes are noted in the lumbar spine. IMPRESSION: 1. No acute abnormality. 2. Moderate degenerative changes of the hips, right greater than left. 3. Atherosclerosis. Electronically Signed   By: Marin Roberts M.D.   On: 08/18/2018 12:07      No results found for: HGBA1C Lab Results  Component Value Date   CREATININE 0.64 08/19/2018       Scheduled Meds: . apixaban  5 mg Oral BID  . ezetimibe-simvastatin  1 tablet Oral Daily  . hydrocerin   Topical BID  . memantine  10 mg Oral BID  . nystatin   Topical TID  . traMADol  50 mg Oral BID   Continuous Infusions: . sodium chloride 1,000 mL (08/19/18 0735)  . cefTRIAXone (ROCEPHIN)  IV       LOS: 0 days    Time spent: >30  MINS    Richarda Overlie  [facility] Pager [phone]. If 7PM-7AM, please contact night-coverage at [email or web address], password [facility] 08/19/2018, 9:54 AM  LOS: 0 days

## 2018-08-19 NOTE — Progress Notes (Signed)
  Echocardiogram 2D Echocardiogram has been performed.  [name] L Androw 08/19/2018, 1:52 PM

## 2018-08-19 NOTE — Consult Note (Addendum)
WOC Nurse wound consult note Reason for Consult: Consult requested for bilat legs.  Pt has chronic venous stasis skin changes and dry crusted peau de' orange skin appearance. Generalized edema, no wounds or drainage requiring topical treatment. Dressing procedure/placement/frequency: Apply Eucerin cream to bilat legs Q day after bathing and roughly towel drying to assist with removal of loose skin. Please re-consult if further assistance is needed.  Thank-you,  Cammie Mcgee MSN, RN, CWOCN, [place], CNS [phone]

## 2018-08-19 NOTE — Evaluation (Signed)
Physical Therapy Evaluation Patient Details Name: [name] MRN: [id number] DOB: 1924-01-24 Today's Date: 08/19/2018   History of Present Illness  84yo female brought to the ED due to fall at home in which she hit her head. CT head negative for acute fracture or bleed, imaging otherwise negative for hip fracture. Diagnosed with UTI. PMH aortic valve disorder, OA, dementia, HTN, vertigo, glaucoma, shoulder surgery, cardiac cath   Clinical Impression   Patient received in bed, pleasantly confused but able to answer 3/4 orientation questions accurately this afternoon, otherwise perseverating on keeping door to her room open and going home. Skin on bilateral LEs significantly impaired with tree trunk look but not tender to palpation, ointment applied previously by staff. She requires MaxA for functional bed mobility, demonstrates strong posterior lean at EOB and requires ModA to maintain upright sitting. Deferred progression of mobility due to poor sitting balance and due to patient's reports that she requires 2 people for mobility at home. MaxA to return to supine, nurse tech then assisted in repositioning patient in bed with totalAx2. She was left in bed with all needs met, nurse tech present, bed alarm activated. She will continue to benefit from skilled PT services in the acute setting as well as ongoing skilled services in the ST-SNF setting moving forward.     Follow Up Recommendations SNF    Equipment Recommendations  Other (comment)(defer to next venue )    Recommendations for Other Services       Precautions / Restrictions Precautions Precautions: Fall Restrictions Weight Bearing Restrictions: No      Mobility  Bed Mobility Overal bed mobility: Needs Assistance Bed Mobility: Supine to Sit;Sit to Supine     Supine to sit: Max assist Sit to supine: Max assist   General bed mobility comments: MaxA to bring legs over side of bed and pivot at EOB; posterior lean noted when  sitting at EOB, easily fatigued   Transfers                 General transfer comment: deferred  Ambulation/Gait             General Gait Details: deferred   Stairs            Wheelchair Mobility    Modified Rankin (Stroke Patients Only)       Balance Overall balance assessment: Needs assistance;History of Falls Sitting-balance support: Bilateral upper extremity supported;Feet supported Sitting balance-Leahy Scale: Poor                                       Pertinent Vitals/Pain Pain Assessment: No/denies pain    Home Living Family/patient expects to be discharged to:: Private residence Living Arrangements: Alone Available Help at Discharge: Family;Available 24 hours/day Type of Home: House Home Access: Stairs to enter     Home Layout: One level Home Equipment: Environmental consultant - 2 wheels;Cane - single point;Bedside commode;Shower seat Additional Comments: family not in room to provide history, all information taken from prior charting     Prior Function Level of Independence: Needs assistance         Comments: family not present to confirm/provide baseline information, patient reports she needs both daughters to stand and walk      Hand Dominance        Extremity/Trunk Assessment   Upper Extremity Assessment Upper Extremity Assessment: Generalized weakness    Lower Extremity Assessment Lower  Extremity Assessment: Generalized weakness    Cervical / Trunk Assessment Cervical / Trunk Assessment: Kyphotic  Communication   Communication: HOH  Cognition Arousal/Alertness: Awake/alert Behavior During Therapy: WFL for tasks assessed/performed Overall Cognitive Status: History of cognitive impairments - at baseline                                 General Comments: baseline dementia; perseverating on going home today and having door to her room open. Follows simple commands with extended time.       General  Comments General comments (skin integrity, edema, etc.): B LEs with significant integumentary impairment, "tree bark" look to both and ointment previously applied by staff     Exercises     Assessment/Plan    PT Assessment Patient needs continued PT services  PT Problem List Decreased strength;Decreased mobility;Decreased safety awareness;Decreased coordination;Decreased activity tolerance;Decreased cognition;Decreased balance       PT Treatment Interventions DME instruction;Therapeutic activities;Gait training;Therapeutic exercise;Patient/family education;Stair training;Balance training;Functional mobility training;Neuromuscular re-education    PT Goals (Current goals can be found in the Care Plan section)  Acute Rehab PT Goals Patient Stated Goal: to go home  PT Goal Formulation: With patient Time For Goal Achievement: 09/02/18 Potential to Achieve Goals: Fair    Frequency Min 2X/week   Barriers to discharge Decreased caregiver support      Co-evaluation               AM-PAC PT "6 Clicks" Daily Activity  Outcome Measure Difficulty turning over in bed (including adjusting bedclothes, sheets and blankets)?: Unable Difficulty moving from lying on back to sitting on the side of the bed? : Unable Difficulty sitting down on and standing up from a chair with arms (e.g., wheelchair, bedside commode, etc,.)?: Unable Help needed moving to and from a bed to chair (including a wheelchair)?: Total Help needed walking in hospital room?: Total Help needed climbing 3-5 steps with a railing? : Total 6 Click Score: 6    End of Session   Activity Tolerance: Patient limited by fatigue Patient left: in bed;with bed alarm set;with nursing/sitter in room;with call bell/phone within reach   PT Visit Diagnosis: Muscle weakness (generalized) (M62.81);History of falling (Z91.81);Unsteadiness on feet (R26.81);Difficulty in walking, not elsewhere classified (R26.2)    Time: 6440-3474 PT  Time Calculation (min) (ACUTE ONLY): 26 min   Charges:   PT Evaluation $PT Eval Moderate Complexity: 1 Mod PT Treatments $Therapeutic Activity: 8-22 mins        Deniece Ree PT, DPT, CBIS  Supplemental Physical Therapist [place]    Pager [phone] Acute Rehab Office [phone]

## 2018-08-19 NOTE — Progress Notes (Signed)
Initial Nutrition Assessment  DOCUMENTATION CODES:   Obesity unspecified  INTERVENTION:   -30 ml Prostat BID, each supplement provides 100 kcals and 15 grams protein -MVI with minerals daily -Feeding assistance with meals  NUTRITION DIAGNOSIS:   Increased nutrient needs related to wound healing as evidenced by estimated needs.  GOAL:   Patient will meet greater than or equal to 90% of their needs  MONITOR:   PO intake, Supplement acceptance, Labs, Weight trends, Skin, I & O's  REASON FOR ASSESSMENT:   Low Braden    ASSESSMENT:   [name] is a 84 y.o. female with medical history significant of HTN, dementia, AVS.  Patient brought in to ED following a fall at home, hit head in bathroom, she is on eliquis (looks like for DVT / PE back in 2017).  Pt admitted with UTI.   No family present at time of visit. Pt was sleepy soundly and did not arouse when name was called or during exam. Unable to obtain further nutrition hx at this time.   Case discussed with RN, who reports that pt was given Ativan due to agitation (pt was also calling out all night). Per RN, pt is taking medications well, however, pt claims she is able to feed herself and hesitant to accept help from staff. When pt attempted to feed herself this AM, RN reported pt dropping utensils and food. Pt reports that her daughter cooks for her at home.   Reviewed wt hx, which reveals steady wt gain over the past 2 years. Wt and mobility status difficult to assess due to edema and yeast over bilateral extremities. Per RN, pt reports using a cane or walker at home for ambulation. Per RN, pt will likely d/c to SNF.   Labs reviewed.   NUTRITION - FOCUSED PHYSICAL EXAM:    Most Recent Value  Orbital Region  No depletion  Upper Arm Region  No depletion  Thoracic and Lumbar Region  No depletion  Buccal Region  No depletion  Temple Region  No depletion  Clavicle Bone Region  No depletion  Clavicle and Acromion Bone  Region  No depletion  Scapular Bone Region  No depletion  Dorsal Hand  No depletion  Patellar Region  No depletion  Anterior Thigh Region  No depletion  Posterior Calf Region  No depletion  Edema (RD Assessment)  Mild  Hair  Reviewed  Eyes  Reviewed  Mouth  Reviewed  Skin  Reviewed  Nails  Reviewed       Diet Order:   Diet Order            Diet Heart Room service appropriate? Yes; Fluid consistency: Thin  Diet effective now              EDUCATION NEEDS:   No education needs have been identified at this time  Skin:  Skin Assessment: Skin Integrity Issues: Skin Integrity Issues:: DTI, Other (Comment) DTI: rt heel Other: MASD breast, abdomen, buttocks, groin  Last BM:  PTA  Height:   Ht Readings from Last 1 Encounters:  08/18/18 5\' 1"  (1.549 m)    Weight:   Wt Readings from Last 1 Encounters:  08/19/18 82.6 kg    Ideal Body Weight:  47.7 kg  BMI:  Body mass index is 34.39 kg/m.  Estimated Nutritional Needs:   Kcal:  1600-1800  Protein:  80-95 grams  Fluid:  > 1.6 L    Christa Fasig A. Mayford Knife, RD, LDN, CDE Pager: [phone] After hours  Pager: [phone]

## 2018-08-19 NOTE — Plan of Care (Signed)
°  Problem: Clinical Measurements: °Goal: Respiratory complications will improve °Outcome: Progressing °Goal: Cardiovascular complication will be avoided °Outcome: Progressing °  °Problem: Elimination: °Goal: Will not experience complications related to urinary retention °Outcome: Progressing °  °Problem: Safety: °Goal: Ability to remain free from injury will improve °Outcome: Progressing °  °

## 2018-08-20 DIAGNOSIS — L8990 Pressure ulcer of unspecified site, unspecified stage: Secondary | ICD-10-CM

## 2018-08-20 DIAGNOSIS — F0391 Unspecified dementia with behavioral disturbance: Secondary | ICD-10-CM

## 2018-08-20 DIAGNOSIS — E66811 Obesity, class 1: Secondary | ICD-10-CM

## 2018-08-20 DIAGNOSIS — E669 Obesity, unspecified: Secondary | ICD-10-CM

## 2018-08-20 DIAGNOSIS — N3001 Acute cystitis with hematuria: Secondary | ICD-10-CM

## 2018-08-20 DIAGNOSIS — F03918 Unspecified dementia, unspecified severity, with other behavioral disturbance: Secondary | ICD-10-CM

## 2018-08-20 DIAGNOSIS — I350 Nonrheumatic aortic (valve) stenosis: Secondary | ICD-10-CM

## 2018-08-20 LAB — CBC
HCT: 36.2 % (ref 36.0–46.0)
Hemoglobin: 11 g/dL — ABNORMAL LOW (ref 12.0–15.0)
MCH: 27.8 pg (ref 26.0–34.0)
MCHC: 30.4 g/dL (ref 30.0–36.0)
MCV: 91.6 fL (ref 80.0–100.0)
PLATELETS: 263 10*3/uL (ref 150–400)
RBC: 3.95 MIL/uL (ref 3.87–5.11)
RDW: 15.7 % — AB (ref 11.5–15.5)
WBC: 9.2 10*3/uL (ref 4.0–10.5)
nRBC: 0 % (ref 0.0–0.2)

## 2018-08-20 LAB — COMPREHENSIVE METABOLIC PANEL
ALBUMIN: 2.4 g/dL — AB (ref 3.5–5.0)
ALT: 12 U/L (ref 0–44)
AST: 27 U/L (ref 15–41)
Alkaline Phosphatase: 71 U/L (ref 38–126)
Anion gap: 8 (ref 5–15)
BUN: 11 mg/dL (ref 8–23)
CHLORIDE: 111 mmol/L (ref 98–111)
CO2: 20 mmol/L — AB (ref 22–32)
CREATININE: 0.83 mg/dL (ref 0.44–1.00)
Calcium: 8.2 mg/dL — ABNORMAL LOW (ref 8.9–10.3)
GFR calc Af Amer: >60 mL/min (ref >60)
GFR calc non Af Amer: 59 mL/min — ABNORMAL LOW (ref >60)
GLUCOSE: 119 mg/dL — AB (ref 70–99)
Potassium: 4.1 mmol/L (ref 3.5–5.1)
SODIUM: 139 mmol/L (ref 135–145)
Total Bilirubin: 0.8 mg/dL (ref 0.3–1.2)
Total Protein: 5.5 g/dL — ABNORMAL LOW (ref 6.5–8.1)

## 2018-08-20 LAB — MAGNESIUM: MAGNESIUM: 1.7 mg/dL (ref 1.7–2.4)

## 2018-08-20 MED ORDER — LORAZEPAM 2 MG/ML IJ SOLN
1.0000 mg | Freq: Once | INTRAMUSCULAR | Status: AC
  Administered 2018-08-20: 1 mg via INTRAVENOUS
  Filled 2018-08-20: qty 1

## 2018-08-20 NOTE — Clinical Social Work Placement (Signed)
   CLINICAL SOCIAL WORK PLACEMENT  NOTE  Date:  08/20/2018  Patient Details  Name: [name] MRN: [id number] Date of Birth: Jun 04, 1924  Clinical Social Work is seeking post-discharge placement for this patient at the Skilled  Nursing Facility level of care (*CSW will initial, date and re-position this form in  chart as items are completed):      Patient/family provided with [facility] Clinical Social Work Department's list of facilities offering this level of care within the geographic area requested by the patient (or if unable, by the patient's family).      Patient/family informed of their freedom to choose among providers that offer the needed level of care, that participate in Medicare, Medicaid or managed care program needed by the patient, have an available bed and are willing to accept the patient.      Patient/family informed of [facility]'s ownership interest in [facility] and [facility], as well as of the fact that they are under no obligation to receive care at these facilities.  PASRR submitted to EDS on 08/20/18     PASRR number received on       Existing PASRR number confirmed on 08/20/18     FL2 transmitted to all facilities in geographic area requested by pt/family on 08/20/18     FL2 transmitted to all facilities within larger geographic area on       Patient informed that his/her managed care company has contracts with or will negotiate with certain facilities, including the following:            Patient/family informed of bed offers received.  Patient chooses bed at       Physician recommends and patient chooses bed at      Patient to be transferred to   on  .  Patient to be transferred to facility by       Patient family notified on   of transfer.  Name of family member notified:        PHYSICIAN Please sign FL2, Please sign DNR     Additional Comment:    _______________________________________________ Margarito Liner,  LCSW 08/20/2018, 12:33 PM

## 2018-08-20 NOTE — Progress Notes (Signed)
PROGRESS NOTE  KRISTINIA LEAVY ZOX:[id number] DOB: Nov 22, 1923 DOA: 08/18/2018 PCP: Merri Brunette, MD  Brief Narrative: 84 year old woman brought to the emergency department status post fall at home, hit head, on apixaban, found to have poor hygiene, low-grade temperature, sinus tachycardia, evidence of poor hygiene, self-care.  Admitted for UTI, candidiasis, possible cellulitis of the inguinal folds, buttocks, groin.  Assessment/Plan UTI --Continue empiric antibiotics.  Follow-up culture data.  Cutaneous candidiasis, possible cellulitis inguinal folds, buttocks, groin; chronic venous stasis bilateral legs --Continue topical treatment with nystatin.  Fall at home with closed head injury on apixaban.  CT head no acute abnormalities. --Therapy recommendation for skilled nursing facility.  Poor hygiene at home  Critical aortic stenosis --Appears asymptomatic at this point.  Not clear whether connected to fall or not.  She would be a poor candidate for any intervention at this point.  Will arrange outpatient follow-up with cardiology  Dementia with acute delirium/behavioral disturbance in the setting of UTI --Continue Namenda  PMH DVT, PE --On apixaban currently  Obesity unspecified --Pro-stat twice daily, multivitamin with minerals daily  DVT prophylaxis: apixaban Code Status: DNR Family Communication: none Disposition Plan: SNF    Brendia Sacks, MD  [facility] Direct contact: [phone] --Via amion app OR  --[email or web address]; password [email or web address]  7PM-7AM contact night coverage as above 08/20/2018, 3:53 PM  LOS: 1 day   Consultants:    Procedures:  Echo Study Conclusions  - Left ventricle: The cavity size was normal. Wall thickness was   increased in a pattern of mild LVH. Systolic function was normal.   The estimated ejection fraction was in the range of 60% to 65%.   Doppler parameters are consistent with abnormal left ventricular   relaxation (grade 1  diastolic dysfunction). - Aortic valve: There was critical stenosis. There was mild   regurgitation. Valve area (VTI): 0.72 cm^2. Valve area (Vmax):   1.4 cm^2. Valve area (Vmean): 0.75 cm^2. - Mitral valve: Severely calcified annulus. Moderately thickened,   moderately calcified leaflets . There was mild regurgitation. - Left atrium: The atrium was moderately dilated. - Atrial septum: A patent foramen ovale cannot be excluded. - Pulmonary arteries: PA peak pressure: 53 mm Hg (S).  Antimicrobials:    Interval history/Subjective: Agitated last night, required Ativan. Groggy today.  Objective: Vitals:  Vitals:   08/20/18 0409 08/20/18 1359  BP: (!) 138/101 (!) 147/73  Pulse: (!) 145 (!) 118  Resp: 20 18  Temp: 100 F (37.8 C) 98.7 F (37.1 C)  SpO2:  96%    Exam:  Constitutional:  . Appears calm and comfortable Respiratory:  . CTA bilaterally, no w/r/r.  . Respiratory effort normal.  Cardiovascular:  . RRR, 3/6 systolic murmur, no r/g . Chronic mild BLE extremity edema   Abdomen:  . Soft, ntnd, skin appears unremarkable, Purewick in place.  Skin appears dry abdomen and groin.  Candidiasis appears to be resolved. Skin:  . Chronic venous stasis changes BLE, thickened, dry skin Psychiatric:  . Mental status . Confused, sleeping   I have personally reviewed the following:   Data: . Complete metabolic panel unremarkable . Hemoglobin stable 11.0, remainder CBC unremarkable . Urine culture greater than 100,000 gram-negative rods . CT abdomen pelvis no acute bowel obstruction or inflammation. . Chest x-ray showed chronic findings including chronic interstitial lung disease  Scheduled Meds: . apixaban  5 mg Oral BID  . ezetimibe-simvastatin  1 tablet Oral Daily  . feeding supplement (PRO-STAT SUGAR FREE 64)  30 mL Oral BID  .  hydrocerin   Topical Daily  . memantine  10 mg Oral BID  . multivitamin with minerals  1 tablet Oral Daily  . nystatin   Topical TID  .  traMADol  50 mg Oral BID   Continuous Infusions: . 0.9 % NaCl with KCl 20 mEq / L 75 mL/hr at 08/20/18 0506  . cefTRIAXone (ROCEPHIN)  IV Stopped (08/19/18 1626)    Principal Problem:   UTI (urinary tract infection) Active Problems:   Pressure injury of skin   Critical aortic valve stenosis   Dementia with behavioral disturbance (HCC)   Obesity (BMI 30.0-34.9)   LOS: 1 day

## 2018-08-20 NOTE — Progress Notes (Signed)
Pt is to be moved to [address]. Daughter Logan Bores, [name]) was called on both home and cell phone but she was unable to answer. Brennan Bailey was notified for the transfer

## 2018-08-20 NOTE — Clinical Social Work Note (Signed)
Clinical Social Work Assessment  Patient Details  Name: [name] MRN: [id number] Date of Birth: 04-18-1924  Date of referral:  08/20/18               Reason for consult:  Facility Placement, Discharge Planning                Permission sought to share information with:  Facility Medical sales representative, Family Supports Permission granted to share information::     Name::     Armando Gang  Agency::  SNF's  Relationship::  Daughter  Contact Information:  [phone]  Housing/Transportation Living arrangements for the past 2 months:  Single Family Home Source of Information:  Medical Team, Adult Children Patient Interpreter Needed:  None Criminal Activity/Legal Involvement Pertinent to Current Situation/Hospitalization:  No - Comment as needed Significant Relationships:  Adult Children Lives with:  Adult Children Do you feel safe going back to the place where you live?  Yes Need for family participation in patient care:  Yes (Comment)  Care giving concerns:  PT recommending SNF once medically stable for discharge.   Social Worker assessment / plan:  Patient not fully oriented. No supports at bedside. CSW called patient's daughter, introduced role, and explained that PT recommendations would be discussed. Patient's daughter is agreeable to SNF placement. No facility preference. Patient was at [facility] and [facility] a few years ago after a fall. She has been living with her daughter and son-in-law ever since. Patient's daughter does not want her to return to [name]. No further concerns. CSW encouraged patient's daughter to contact CSW as needed. CSW will continue to follow patient and her daughter for support and facilitate discharge to SNF once medically stable.  Employment status:  Retired Database administrator PT Recommendations:  Skilled Nursing Facility Information / Referral to community resources:  Skilled Nursing Facility  Patient/Family's  Response to care:  Patient not fully oriented. Patient's daughter agreeable to SNF placement. Patient's daughter and son-in-law supportive and involved in patient's care. Patient's daughter appreciated social work intervention.  Patient/Family's Understanding of and Emotional Response to Diagnosis, Current Treatment, and Prognosis:  Patient not fully oriented. Patient's daughter has a good understanding of the reason for admission and her need for rehab prior to returning home. Patient's daughter appears happy with hospital care.  Emotional Assessment Appearance:  Appears stated age Attitude/Demeanor/Rapport:  Unable to Assess Affect (typically observed):  Unable to Assess Orientation:  Oriented to Self, Oriented to Place Alcohol / Substance use:  Never Used Psych involvement (Current and /or in the community):  No (Comment)  Discharge Needs  Concerns to be addressed:  Care Coordination Readmission within the last 30 days:  No Current discharge risk:  Dependent with Mobility Barriers to Discharge:  Continued Medical Work up, Insurance Authorization   Margarito Liner, LCSW 08/20/2018, 12:30 PM

## 2018-08-20 NOTE — NC FL2 (Signed)
[place] MEDICAID FL2 LEVEL OF CARE SCREENING TOOL     IDENTIFICATION  Patient Name: [name] Birthdate: 05-20-1924 Sex: female Admission Date (Current Location): 08/18/2018  [facility] and [place] Number:  Producer, television/film/video and Address:  [facility], [address] [place], [place] [address]      Provider Number: [id number]  Attending Physician Name and Address:  Standley Brooking, MD  Relative Name and Phone Number:       Current Level of Care: Hospital Recommended Level of Care: Skilled Nursing Facility Prior Approval Number:    Date Approved/Denied:   PASRR Number: [id number]  Discharge Plan: SNF    Current Diagnoses: Patient Active Problem List   Diagnosis Date Noted  . Pressure injury of skin 08/20/2018  . SIRS (systemic inflammatory response syndrome) (HCC) 08/19/2018  . UTI (urinary tract infection) 08/18/2018  . Cellulitis of groin 08/18/2018  . Lower leg DVT (deep venous thrombosis), right 12/26/2015  . Generalized weakness 12/26/2015  . Pulmonary embolism without acute cor pulmonale (HCC) 12/26/2015  . Humerus fracture 12/18/2015  . Pulmonary embolism (HCC) 12/18/2015  . Acute encephalopathy   . Fracture of humerus, left, closed 12/17/2015  . SAH (subarachnoid hemorrhage) (HCC) 11/16/2015  . Diastolic dysfunction-grade 2 11/16/2015  . Wrist fracture 11/14/2015  . Fall 11/14/2015  . Essential hypertension   . Hypercholesteremia   . Aortic stenosis, severe   . Allergic rhinitis   . Arthritis   . Dyslipidemia   . Obesity     Orientation RESPIRATION BLADDER Height & Weight     Self, Place  Normal Incontinent, External catheter Weight: 179 lb (81.2 kg) Height:  5\' 1"  (154.9 cm)(pt reported)  BEHAVIORAL SYMPTOMS/MOOD NEUROLOGICAL BOWEL NUTRITION STATUS  Other (Comment)(Anxious, restless.) (None) Continent Diet(Heart healthy)  AMBULATORY STATUS COMMUNICATION OF NEEDS Skin   Extensive Assist Verbally Bruising,  Other (Comment)(Cellulitis, Cracking, Excoriated, MASD. Deep tissue on right heel: No dressing.)                       Personal Care Assistance Level of Assistance  Total care       Total Care Assistance: Maximum assistance   Functional Limitations Info  Sight, Hearing, Speech Sight Info: Adequate Hearing Info: Adequate Speech Info: Adequate    SPECIAL CARE FACTORS FREQUENCY  PT (By licensed PT), Blood pressure, OT (By licensed OT)     PT Frequency: 5 x week OT Frequency: 5 x week            Contractures Contractures Info: Not present    Additional Factors Info  Code Status, Allergies Code Status Info: DNR Allergies Info: NKDA           Current Medications (08/20/2018):  This is the current hospital active medication list Current Facility-Administered Medications  Medication Dose Route Frequency Provider Last Rate Last Dose  . 0.9 % NaCl with KCl 20 mEq/ L  infusion   Intravenous Continuous Richarda Overlie, MD 75 mL/hr at 08/20/18 0506    . acetaminophen (TYLENOL) tablet 650 mg  650 mg Oral Q6H PRN Hillary Bow, DO   650 mg at 08/20/18 0419   Or  . acetaminophen (TYLENOL) suppository 650 mg  650 mg Rectal Q6H PRN Hillary Bow, DO      . apixaban Everlene Balls) tablet 5 mg  5 mg Oral BID Lyda Perone M, DO   5 mg at 08/20/18 1043  . cefTRIAXone (ROCEPHIN) 1 g in sodium chloride  0.9 % 100 mL IVPB  1 g Intravenous Q24H Hillary Bow, DO   Stopped at 08/19/18 1626  . ezetimibe-simvastatin (VYTORIN) 10-20 MG per tablet 1 tablet  1 tablet Oral Daily Lyda Perone M, DO   1 tablet at 08/20/18 1043  . feeding supplement (PRO-STAT SUGAR FREE 64) liquid 30 mL  30 mL Oral BID Richarda Overlie, MD   30 mL at 08/20/18 1044  . hydrocerin (EUCERIN) cream   Topical Daily Richarda Overlie, MD      . memantine (NAMENDA) tablet 10 mg  10 mg Oral BID Lyda Perone M, DO   10 mg at 08/20/18 1043  . multivitamin with minerals tablet 1 tablet  1 tablet Oral Daily Richarda Overlie, MD    1 tablet at 08/20/18 1043  . nystatin (MYCOSTATIN/NYSTOP) topical powder   Topical TID Hillary Bow, DO      . ondansetron [facility]) tablet 4 mg  4 mg Oral Q6H PRN Hillary Bow, DO       Or  . ondansetron [facility]) injection 4 mg  4 mg Intravenous Q6H PRN Hillary Bow, DO      . polyethylene glycol (MIRALAX / GLYCOLAX) packet 17 g  17 g Oral Daily PRN Hillary Bow, DO      . traMADol Janean Sark) tablet 50 mg  50 mg Oral BID Lyda Perone M, DO   50 mg at 08/20/18 1044     Discharge Medications: Please see discharge summary for a list of discharge medications.  Relevant Imaging Results:  Relevant Lab Results:   Additional Information SS#: [id number]  Margarito Liner, LCSW

## 2018-08-20 NOTE — Progress Notes (Signed)
Rocephin was not scanned but has been given by day shift nurse. The bag is still hanging but infusion is completed already. Pharmacist has been notified of the incident.

## 2018-08-20 NOTE — Evaluation (Signed)
Occupational Therapy Evaluation Patient Details Name: [name] MRN: [id number] DOB: 1923/11/29 Today's Date: 08/20/2018    History of Present Illness 84yo female brought to the ED due to fall at home in which she hit her head. CT head negative for acute fracture or bleed, imaging otherwise negative for hip fracture. Diagnosed with UTI. PMH aortic valve disorder, OA, dementia, HTN, vertigo, glaucoma, shoulder surgery, cardiac cath    Clinical Impression   Pt with minimal ability to participate in mobility or ADL due to lethargy likely from medications administered last night. Pt currently requires total assist. Will follow acutely. Recommending SNF for further rehab.    Follow Up Recommendations  SNF;Supervision/Assistance - 24 hour    Equipment Recommendations       Recommendations for Other Services       Precautions / Restrictions Precautions Precautions: Fall Restrictions Weight Bearing Restrictions: No      Mobility Bed Mobility Overal bed mobility: Needs Assistance             General bed mobility comments: +2 total assist for all aspects/positioning  Transfers                 General transfer comment: deferred due to lethargy    Balance                                           ADL either performed or assessed with clinical judgement   ADL                                         General ADL Comments: requires total assist     Vision         Perception     Praxis      Pertinent Vitals/Pain Pain Assessment: Faces Faces Pain Scale: No hurt     Hand Dominance Right   Extremity/Trunk Assessment Upper Extremity Assessment Upper Extremity Assessment: Generalized weakness   Lower Extremity Assessment Lower Extremity Assessment: Generalized weakness   Cervical / Trunk Assessment Cervical / Trunk Assessment: Kyphotic   Communication Communication Communication: HOH   Cognition  Arousal/Alertness: Lethargic;Suspect due to medications(pt given Ativan last night per RN) Behavior During Therapy: Flat affect Overall Cognitive Status: History of cognitive impairments - at baseline                                 General Comments: baseline dementia, unable to participate in feeding due to lethargy   General Comments       Exercises     Shoulder Instructions      Home Living Family/patient expects to be discharged to:: Private residence Living Arrangements: Children(daughter) Available Help at Discharge: Family;Available 24 hours/day Type of Home: House Home Access: Stairs to enter     Home Layout: One level     Bathroom Shower/Tub: Tub/shower unit         Home Equipment: Environmental consultant - 2 wheels;Cane - single point;Bedside commode;Shower seat   Additional Comments: information gained through staff and prior chart      Prior Functioning/Environment Level of Independence: Needs assistance    ADL's / Homemaking Assistance Needed: assisted for bathing, dressing, toileting, could self feed and participate in grooming  OT Problem List: Decreased strength;Decreased activity tolerance;Impaired balance (sitting and/or standing);Decreased cognition;Impaired UE functional use      OT Treatment/Interventions: Self-care/ADL training;Patient/family education;Balance training;Cognitive remediation/compensation;Therapeutic activities;DME and/or AE instruction    OT Goals(Current goals can be found in the care plan section) Acute Rehab OT Goals Patient Stated Goal: for everybody to be quiet OT Goal Formulation: Patient unable to participate in goal setting Time For Goal Achievement: 09/03/18 Potential to Achieve Goals: Fair ADL Goals Pt Will Perform Eating: with min assist;sitting;bed level Pt Will Perform Grooming: with mod assist;sitting Additional ADL Goal #1: Pt will perform bed mobility with moderate assistance for positioning and to  get to EOB for ADL. Additional ADL Goal #2: Pt will sit EOB with min guard assist x 5 minutes as a precursor to ADL. Additional ADL Goal #3: Pt will follow one step commands 50% of time.  OT Frequency: Min 2X/week   Barriers to D/C:            Co-evaluation              AM-PAC PT "6 Clicks" Daily Activity     Outcome Measure Help from another person eating meals?: Total Help from another person taking care of personal grooming?: Total Help from another person toileting, which includes using toliet, bedpan, or urinal?: Total Help from another person bathing (including washing, rinsing, drying)?: Total Help from another person to put on and taking off regular upper body clothing?: Total Help from another person to put on and taking off regular lower body clothing?: Total 6 Click Score: 6   End of Session    Activity Tolerance: Patient limited by lethargy Patient left: in bed;with call bell/phone within reach;with bed alarm set;with nursing/sitter in room  OT Visit Diagnosis: Cognitive communication deficit (R41.841)                Time: 1045-1100 OT Time Calculation (min): 15 min Charges:  OT General Charges $OT Visit: 1 Visit OT Evaluation $OT Eval Moderate Complexity: 1 Mod  Martie Round, OTR/L Acute Rehabilitation Services Pager: [phone] Office: [phone]  Evern Bio 08/20/2018, 11:55 AM

## 2018-08-21 DIAGNOSIS — N390 Urinary tract infection, site not specified: Principal | ICD-10-CM

## 2018-08-21 LAB — URINE CULTURE: Culture: >=100000 — AB

## 2018-08-21 MED ORDER — METOPROLOL SUCCINATE ER 25 MG PO TB24
25.0000 mg | ORAL_TABLET | Freq: Every day | ORAL | Status: DC
  Administered 2018-08-21 – 2018-08-23 (×3): 25 mg via ORAL
  Filled 2018-08-21 (×4): qty 1

## 2018-08-21 MED ORDER — FUROSEMIDE 20 MG PO TABS
20.0000 mg | ORAL_TABLET | Freq: Every day | ORAL | Status: DC
  Administered 2018-08-21 – 2018-08-23 (×3): 20 mg via ORAL
  Filled 2018-08-21 (×4): qty 1

## 2018-08-21 NOTE — Progress Notes (Signed)
PROGRESS NOTE  [name] ZOX:[id number] DOB: 04/15/1924 DOA: 08/18/2018 PCP: Merri Brunette, MD  Brief Narrative: 84 year old woman brought to the emergency department status post fall at home, hit head, on apixaban, found to have poor hygiene, low-grade temperature, sinus tachycardia, evidence of poor hygiene, self-care.  Admitted for UTI, candidiasis, possible cellulitis of the inguinal folds, buttocks, groin.  Assessment/Plan E coli UTI --Complete ceftriaxone course  Cutaneous candidiasis, possible cellulitis inguinal folds, buttocks, groin; chronic venous stasis bilateral legs --Continue topical treatment with nystatin.  Fall at home with closed head injury on apixaban.  CT head no acute abnormalities. --Therapy recommended skilled nursing facility.  Poor hygiene at home  Critical aortic stenosis --Appears asymptomatic.  Not clear whether connected to fall or not.  She would be a poor candidate for any intervention at this point.  Will arrange outpatient follow-up with cardiology  Dementia with acute delirium/behavioral disturbance in the setting of UTI --Continue Namenda  PMH DVT, PE --On apixaban currently  Obesity unspecified --Pro-stat twice daily, multivitamin with minerals daily  Appears to be stabilizing.  Anticipate transfer to skilled nursing facility when bed available hopefully 11/3 if insurance authorization obtained.  DVT prophylaxis: apixaban Code Status: DNR Family Communication: none Disposition Plan: SNF    Brendia Sacks, MD  [facility] Direct contact: [phone] --Via amion app OR  --[email or web address]; password [email or web address]  7PM-7AM contact night coverage as above 08/21/2018, 1:08 PM  LOS: 2 days   Consultants:    Procedures:  Echo Study Conclusions  - Left ventricle: The cavity size was normal. Wall thickness was   increased in a pattern of mild LVH. Systolic function was normal.   The estimated ejection fraction was in the  range of 60% to 65%.   Doppler parameters are consistent with abnormal left ventricular   relaxation (grade 1 diastolic dysfunction). - Aortic valve: There was critical stenosis. There was mild   regurgitation. Valve area (VTI): 0.72 cm^2. Valve area (Vmax):   1.4 cm^2. Valve area (Vmean): 0.75 cm^2. - Mitral valve: Severely calcified annulus. Moderately thickened,   moderately calcified leaflets . There was mild regurgitation. - Left atrium: The atrium was moderately dilated. - Atrial septum: A patent foramen ovale cannot be excluded. - Pulmonary arteries: PA peak pressure: 53 mm Hg (S).  Antimicrobials:    Interval history/Subjective: Feels very anxious today.  Per staff she has been crying out constantly.  Objective: Vitals:  Vitals:   08/21/18 0455 08/21/18 1050  BP:  (!) 160/97  Pulse:  (!) 112  Resp:  (!) 28  Temp:  98.2 F (36.8 C)  SpO2: 98% 97%    Exam:  Constitutional:   . Appears somewhat anxious but comfortable Respiratory:  . CTA bilaterally, no w/r/r.  . Respiratory effort normal.  Cardiovascular:  . RRR, no m/r/g . No change in bilateral lower extremity brawny edema Musculoskeletal:  . Moves bilateral lower extremities to command Psychiatric:  . Mental status . Confused but participates well with examination very awake and alert.   I have personally reviewed the following:   Data: . Basic metabolic panel, hepatic function panel unremarkable. . CBC unremarkable  Scheduled Meds: . apixaban  5 mg Oral BID  . ezetimibe-simvastatin  1 tablet Oral Daily  . feeding supplement (PRO-STAT SUGAR FREE 64)  30 mL Oral BID  . hydrocerin   Topical Daily  . memantine  10 mg Oral BID  . multivitamin with minerals  1 tablet Oral Daily  . nystatin   Topical TID  .  traMADol  50 mg Oral BID   Continuous Infusions: . 0.9 % NaCl with KCl 20 mEq / L 75 mL/hr at 08/21/18 1039  . cefTRIAXone (ROCEPHIN)  IV Stopped (08/19/18 1626)    Principal Problem:   UTI  (urinary tract infection) Active Problems:   Pressure injury of skin   Critical aortic valve stenosis   Dementia with behavioral disturbance (HCC)   Obesity (BMI 30.0-34.9)   LOS: 2 days

## 2018-08-21 NOTE — Progress Notes (Signed)
Patient alert and oriented to self and place, anxious and frequently asked for daughter Armando Gang) throughout shift. Nurse called her multiple times and no response. Patient was reassured and reoriented. Patient remained seated in bed for duration of shift.  Dineen Kid, Student-RN 08/21/2018

## 2018-08-21 NOTE — Progress Notes (Signed)
Received patient transfer from 3E.  Patient alert and oriented to self only, elevated BP 164/96, PR 122 and RR 30 d/t anxiety, does not want to be left alone, reassured patient of her safety and that we are just outside and to check on her every hr.  Patient calm down, gave ice water to drink.  Will monitor.

## 2018-08-21 NOTE — Discharge Instructions (Signed)

## 2018-08-22 MED ORDER — TRAMADOL HCL 50 MG PO TABS: 50.0000 mg | ORAL_TABLET | Freq: Two times a day (BID) | ORAL | 0 refills | Status: DC

## 2018-08-22 MED ORDER — HYDROCERIN EX CREA: 1.0000 "application " | TOPICAL_CREAM | Freq: Every day | CUTANEOUS | 0 refills | Status: AC

## 2018-08-22 MED ORDER — ADULT MULTIVITAMIN W/MINERALS CH: 1.0000 | ORAL_TABLET | Freq: Every day | ORAL | Status: DC

## 2018-08-22 MED ORDER — NYSTATIN 100000 UNIT/GM EX POWD: Freq: Three times a day (TID) | CUTANEOUS | 0 refills | Status: AC

## 2018-08-22 MED ORDER — PRO-STAT SUGAR FREE PO LIQD: 30.0000 mL | Freq: Two times a day (BID) | ORAL | 0 refills | Status: DC

## 2018-08-22 MED ORDER — FUROSEMIDE 20 MG PO TABS: 20.0000 mg | ORAL_TABLET | Freq: Every day | ORAL | Status: DC

## 2018-08-22 NOTE — Discharge Summary (Addendum)
Physician Discharge Summary  [name] QIO:[id number] DOB: December 06, 1923 DOA: 08/18/2018  PCP: Karen Brunette, MD  Admit date: 08/18/2018 Discharge date: 08/24/2018  Recommendations for Outpatient Follow-up:   Cutaneous candidiasis, possible cellulitis inguinal folds, buttocks, groin; chronic venous stasis bilateral legs --Continue topical treatment with nystatin.  Chronic venous stasis skin changes and dry crusted peau de' orange skin appearance Apply 1 application topically daily. Apply Eucerin cream to bilat legs Q day after bathing and roughly towel drying to assist with removal of loose skin   Critical aortic stenosis --Appears asymptomatic.  Not clear whether connected to fall or not.  TAVR was considered in past. Have arranged outpt follow-up for further evaluation.    Contact information for follow-up providers    Karen Brunette, MD. Schedule an appointment as soon as possible for a visit in 1 week(s).   Specialty:  Family Medicine Contact information: [address] [place] [address] [phone]        [name], PA-C Follow up on 09/13/2018.   Specialties:  Cardiology, Radiology Why:  9 AM Contact information: [address] [place] [address] [phone]            Contact information for after-discharge care    Destination    [facility] CARE Preferred SNF .   Service:  Skilled Nursing Contact information: [address] [place] [address] [phone]                   Discharge Diagnoses:  1. E coli UTI (principal) 2. Cutaneous candidiasis 3. Fall at home with closed head injury on apixaban 4. Poor hygiene at home 5. Critical aortic stenosis 6. Dementia with acute delirium/behavioral disturbance  Discharge Condition: improved Disposition: SNFT  Diet recommendation: regular  Filed Weights   08/18/18 1600 08/19/18 0434 08/20/18 0300  Weight: 88.5 kg 82.6  kg 81.2 kg    History of present illness:  84 year old woman brought to the emergency department status post fall at home, hit head, on apixaban, found to have poor hygiene, low-grade temperature, sinus tachycardia, evidence of poor hygiene, self-care.  Admitted for UTI, candidiasis, possible cellulitis of the inguinal folds, buttocks, groin.  Hospital Course:  Patient was treated with empiric antibiotics for UTI with clinical resolution as well as resolution of acute behavioral disturbance.  There is no evidence of cellulitis of the inguinal, buttocks or groin areas, rather this was cutaneous candidiasis which responded to topical treatment.  She was seen by physical therapy with recommendation for skilled rehab.  Hospitalization was uncomplicated.  Individual issues as below. Discharge was delayed 48 hours waiting for insurance authorization.  E coli UTI --Completed ceftriaxone course  Cutaneous candidiasis, possible cellulitis inguinal folds, buttocks, groin; chronic venous stasis bilateral legs --Continue topical treatment with nystatin.  Fall at home with closed head injury on apixaban.  CT head no acute abnormalities. --Therapy recommended skilled nursing facility.  Poor hygiene at home  Critical aortic stenosis --Appears asymptomatic.  Not clear whether connected to fall or not.  She would be a poor candidate for any intervention at this point but TAVR considered in past.  I have arranged outpatient follow-up with cardiology  Dementia with acute delirium/behavioral disturbance in the setting of UTI --Continue Namenda  PMH DVT, PE --On apixaban currently  Obesity unspecified --Pro-stat twice daily, multivitamin with minerals daily  Procedures:  Echo Study Conclusions  - Left ventricle: The cavity size was normal. Wall thickness was increased in  a pattern of mild LVH. Systolic function was normal. The estimated ejection fraction was in the range of 60% to  65%. Doppler parameters are consistent with abnormal left ventricular relaxation (grade 1 diastolic dysfunction). - Aortic valve: There was critical stenosis. There was mild regurgitation. Valve area (VTI): 0.72 cm^2. Valve area (Vmax): 1.4 cm^2. Valve area (Vmean): 0.75 cm^2. - Mitral valve: Severely calcified annulus. Moderately thickened, moderately calcified leaflets . There was mild regurgitation. - Left atrium: The atrium was moderately dilated. - Atrial septum: A patent foramen ovale cannot be excluded. - Pulmonary arteries: PA peak pressure: 53 mm Hg (S).  Antimicrobials:  Ceftriaxone   Exam 11/3: See progress note 11/4 for 11/4 exam S: feels fine, no pain, no complaints RN reports no issues, no concerns  O: Vitals:  Vitals:   08/23/18 1710 08/23/18 2118  BP: (!) 144/81 (!) 137/52  Pulse: 92 75  Resp: 18   Temp: 97.6 F (36.4 C) 97.8 F (36.6 C)  SpO2: 100% 97%    Constitutional:   . Appears calm and comfortable ENMT:  . Slightly hard of hearing Respiratory:  . CTA bilaterally, no w/r/r.  . Respiratory effort normal.  Cardiovascular:  . RRR, no m/r/g . No change in BLE brawny extremity edema 2+ Musculoskeletal:  . RLE, LLE   . Moves both feet to command Skin:  . Cutaneous candidiasis of the abdomen and groin improving. Psychiatric:  . Mental status o Mood, affect appropriate    Discharge Instructions   Allergies as of 08/24/2018   No Known Allergies     Medication List    STOP taking these medications   acetaminophen 325 MG tablet Commonly known as:  TYLENOL     TAKE these medications   ELIQUIS 5 MG Tabs tablet Generic drug:  apixaban Take 5 mg by mouth 2 (two) times daily.   ezetimibe-simvastatin 10-20 MG tablet Commonly known as:  VYTORIN Take 1 tablet by mouth daily.   feeding supplement (PRO-STAT SUGAR FREE 64) Liqd Take 30 mLs by mouth 2 (two) times daily.   furosemide 20 MG tablet Commonly known as:   LASIX Take 1 tablet (20 mg total) by mouth daily. What changed:  how much to take   hydrocerin Crea Apply 1 application topically daily. Apply Eucerin cream to bilat legs Q day after bathing and roughly towel drying to assist with removal of loose skin   memantine 10 MG tablet Commonly known as:  NAMENDA Take 1 tablet (10 mg total) by mouth 2 (two) times daily.   metoprolol succinate 25 MG 24 hr tablet Commonly known as:  TOPROL-XL Take 1 tablet (25 mg total) by mouth daily.   multivitamin with minerals Tabs tablet Take 1 tablet by mouth daily.   nystatin powder Commonly known as:  MYCOSTATIN/NYSTOP Apply topically 3 (three) times daily. Apply to affected areas for cutaneous candidiasis   polyethylene glycol packet Commonly known as:  MIRALAX / GLYCOLAX Take 17 g by mouth daily as needed.   traMADol 50 MG tablet Commonly known as:  ULTRAM Take 1 tablet (50 mg total) by mouth 2 (two) times daily.      No Known Allergies  The results of significant diagnostics from this hospitalization (including imaging, microbiology, ancillary and laboratory) are listed below for reference.    Significant Diagnostic Studies: Ct Head Wo Contrast  Result Date: 08/18/2018 CLINICAL DATA:  Status post fall, with injury to the head. Patient on Eliquis. EXAM: CT HEAD WITHOUT CONTRAST TECHNIQUE: Contiguous axial images  were obtained from the base of the skull through the vertex without intravenous contrast. COMPARISON:  CT of the head performed 12/17/2015 FINDINGS: Brain: No evidence of acute infarction, hemorrhage, hydrocephalus, extra-axial collection or mass lesion / mass effect. Prominence of the ventricles and sulci reflects mild to moderate cortical volume loss. Scattered periventricular and subcortical white matter change likely reflects small vessel ischemic microangiopathy. The brainstem and fourth ventricle are within normal limits. The basal ganglia are unremarkable in appearance. The  cerebral hemispheres demonstrate grossly normal gray-white differentiation. No mass effect or midline shift is seen. Vascular: No hyperdense vessel or unexpected calcification. Skull: There is no evidence of fracture; visualized osseous structures are unremarkable in appearance. Sinuses/Orbits: The orbits are within normal limits. The paranasal sinuses and mastoid air cells are well-aerated. Other: Soft tissue swelling is noted at the left occiput. IMPRESSION: 1. No evidence of traumatic intracranial injury or fracture. 2. Soft tissue swelling at the left occiput. 3. Mild to moderate cortical volume loss and scattered small vessel ischemic microangiopathy. Electronically Signed   By: Roanna Raider M.D.   On: 08/18/2018 13:40   Ct Abdomen Pelvis W Contrast  Result Date: 08/18/2018 CLINICAL DATA:  Pain after fall.  Abdominal infection. EXAM: CT ABDOMEN AND PELVIS WITH CONTRAST TECHNIQUE: Multidetector CT imaging of the abdomen and pelvis was performed using the standard protocol following bolus administration of intravenous contrast. CONTRAST:  OMNIPAQUE IOHEXOL 300 MG/ML  SOLN COMPARISON:  Pelvic radiographs 11/21/2015 FINDINGS: Lower chest: Top normal heart size without pericardial effusion. Eventration or elevation of left hemidiaphragm partially obscuring the left lung base and portions of the spleen. Hepatobiliary: Uncomplicated cholelithiasis. No enhancing hepatic mass or laceration. No biliary dilatation. Pancreas: Normal Spleen: Normal Adrenals/Urinary Tract: Adrenal glands are unremarkable. Symmetric cortical enhancement of both kidneys. Nonobstructing right renal pelvic calculus measuring 10 x 7 mm on series 3/26. Subcentimeter hypodensities in the upper and lower pole the left kidney statistically consistent with cysts. These are too small to further characterize. The urinary bladder is unremarkable. Stomach/Bowel: Stomach is within normal limits. Appendix is not well visualized but no pericecal  inflammation is identified. No evidence of bowel wall thickening, distention, or inflammatory changes. Vascular/Lymphatic: Moderate aortoiliac atherosclerosis without aneurysm. CT evidence of lymphadenopathy. Reproductive: Calcified uterine fibroid.  No adnexal mass. Other: No free air or free fluid.  No abdominal hernia. Musculoskeletal: Moderate 50% compression deformity of T11 likely remote without retropulsion. Mild inferior endplate Schmorl's node of T12. Degenerative disc disease L3 through S1 with facet arthropathy greatest at L5-S1. Degenerative joint space narrowing of both hips. IMPRESSION: 1. No acute bowel obstruction or inflammation. 2. Fibroid uterus. No hydroureteronephrosis. The urinary bladder is unremarkable. 3. 50% compression deformity of T11 with inferior endplate Schmorl's node of T12 likely chronic without retropulsion noted. 4. Uncomplicated cholelithiasis. 5. Nonobstructing right renal pelvic calculus measuring 10 x 7 mm. Electronically Signed   By: Tollie Eth M.D.   On: 08/18/2018 18:04   Dg Chest Portable 1 View  Result Date: 08/18/2018 CLINICAL DATA:  Larey Seat today. EXAM: PORTABLE CHEST 1 VIEW COMPARISON:  12/17/2015. FINDINGS: Stable enlarged cardiac silhouette and tortuous and mildly calcified thoracic aorta. Stable prominence of the pulmonary vasculature and interstitial markings. Mildly progressive elevation of the left hemidiaphragm with interval gas in the splenic flexure. Old bilateral shoulder posttraumatic changes, right fixation screw and degenerative changes are again demonstrated. Thoracic spine degenerative changes. IMPRESSION: 1. No acute finding. 2. Stable cardiomegaly, pulmonary vascular congestion and chronic interstitial lung disease. Electronically Signed  By: Beckie Salts M.D.   On: 08/18/2018 15:13   Dg Hips Bilat W Or Wo Pelvis 2 Views  Result Date: 08/18/2018 CLINICAL DATA:  Fall.  Bilateral hip pain. EXAM: DG HIP (WITH OR WITHOUT PELVIS) 2V BILAT  COMPARISON:  None. FINDINGS: The hips are located bilaterally. Degenerative changes are worse on the right. No acute fractures are present. Extensive vascular calcifications are present. Calcified fibroid is noted. Degenerative changes are noted in the lumbar spine. IMPRESSION: 1. No acute abnormality. 2. Moderate degenerative changes of the hips, right greater than left. 3. Atherosclerosis. Electronically Signed   By: Marin Roberts M.D.   On: 08/18/2018 12:07    Microbiology: Recent Results (from the past 240 hour(s))  Culture, Urine     Status: Abnormal   Collection Time: 08/18/18  3:40 PM  Result Value Ref Range Status   Specimen Description URINE, RANDOM  Final   Special Requests   Final    NONE Performed at [facility], [address], [place], [place] [address]    Culture >=100,000 COLONIES/mL ESCHERICHIA COLI (A)  Final   Report Status 08/21/2018 FINAL  Final   Organism ID, Bacteria ESCHERICHIA COLI (A)  Final      Susceptibility   Escherichia coli - MIC*    AMPICILLIN 8 SENSITIVE Sensitive     CEFAZOLIN <=4 SENSITIVE Sensitive     CEFTRIAXONE <=1 SENSITIVE Sensitive     CIPROFLOXACIN <=0.25 SENSITIVE Sensitive     GENTAMICIN <=1 SENSITIVE Sensitive     IMIPENEM <=0.25 SENSITIVE Sensitive     NITROFURANTOIN <=16 SENSITIVE Sensitive     TRIMETH/SULFA >=320 RESISTANT Resistant     AMPICILLIN/SULBACTAM 4 SENSITIVE Sensitive     PIP/TAZO <=4 SENSITIVE Sensitive     Extended ESBL NEGATIVE Sensitive     * >=100,000 COLONIES/mL ESCHERICHIA COLI  Blood Culture (routine x 2)     Status: None   Collection Time: 08/18/18  4:33 PM  Result Value Ref Range Status   Specimen Description BLOOD LEFT HAND  Final   Special Requests   Final    BOTTLES DRAWN AEROBIC AND ANAEROBIC Blood Culture results may not be optimal due to an inadequate volume of blood received in culture bottles   Culture   Final    NO GROWTH 5 DAYS Performed at [facility], [address],  [place], [place] [address]    Report Status 08/23/2018 FINAL  Final  Blood Culture (routine x 2)     Status: None   Collection Time: 08/18/18  4:45 PM  Result Value Ref Range Status   Specimen Description BLOOD LEFT FOREARM  Final   Special Requests   Final    BOTTLES DRAWN AEROBIC AND ANAEROBIC Blood Culture adequate volume   Culture   Final    NO GROWTH 5 DAYS Performed at [facility], [address], [place], [place] [address]    Report Status 08/23/2018 FINAL  Final     Labs: Basic Metabolic Panel: Recent Labs  Lab 08/18/18 1134 08/19/18 0607 08/20/18 0516  NA 137 139 139  K 4.3 3.5 4.1  CL 100 114* 111  CO2 27 21* 20*  GLUCOSE 115* 80 119*  BUN 14 8 11   CREATININE 0.80 0.64 0.83  CALCIUM 9.0 7.3* 8.2*  MG  --   --  1.7   Liver Function Tests: Recent Labs  Lab 08/18/18 2029 08/20/18 0516  AST 19 27  ALT 10 12  ALKPHOS 83  71  BILITOT 0.6 0.8  PROT 6.7 5.5*  ALBUMIN 3.0* 2.4*   CBC: Recent Labs  Lab 08/18/18 1134 08/19/18 0607 08/20/18 0516  WBC 9.8 6.9 9.2  HGB 12.2 9.2* 11.0*  HCT 40.9 29.9* 36.2  MCV 93.0 90.3 91.6  PLT 307 220 263    Principal Problem:   UTI (urinary tract infection) Active Problems:   Pressure injury of skin   Critical aortic valve stenosis   Dementia with behavioral disturbance (HCC)   Obesity (BMI 30.0-34.9)   Time coordinating discharge: 35 minutes  Signed:  Brendia Sacks, MD [facility] 08/24/2018, 9:26 AM

## 2018-08-22 NOTE — Progress Notes (Signed)
Bed offers given to daughter. Daughter will determine facility by tomorrow. Insurance authorization [facility]) will be needed and cannot be started until Monday.

## 2018-08-23 DIAGNOSIS — F0280 Dementia in other diseases classified elsewhere without behavioral disturbance: Secondary | ICD-10-CM

## 2018-08-23 DIAGNOSIS — Y92009 Unspecified place in unspecified non-institutional (private) residence as the place of occurrence of the external cause: Secondary | ICD-10-CM

## 2018-08-23 DIAGNOSIS — B372 Candidiasis of skin and nail: Secondary | ICD-10-CM

## 2018-08-23 DIAGNOSIS — G20 Parkinson's disease: Secondary | ICD-10-CM

## 2018-08-23 DIAGNOSIS — W19XXXA Unspecified fall, initial encounter: Secondary | ICD-10-CM

## 2018-08-23 LAB — CULTURE, BLOOD (ROUTINE X 2)
CULTURE: NO GROWTH
CULTURE: NO GROWTH
Special Requests: ADEQUATE

## 2018-08-23 NOTE — Progress Notes (Addendum)
PROGRESS NOTE  [name] ZOX:[id number] DOB: [birth year] DOA: 08/18/2018 PCP: Karen Brunette, MD  Brief Narrative: 84 year old woman brought to the emergency department status post fall at home, hit head, on apixaban, found to have poor hygiene, low-grade temperature, sinus tachycardia, evidence of poor hygiene, self-care. Admitted for UTI, candidiasis, possible cellulitis of the inguinal folds, buttocks, groin.  Assessment/Plan E coliUTI --Appears resolved.  Completed ceftriaxone course.  Cutaneous candidiasis, possible cellulitis inguinal folds, buttocks, groin; chronic venous stasis bilateral legs --Resolving rapidly.  Continue topical treatment with nystatin.  Fall at home with closed head injury on apixaban. CT head no acute abnormalities. --Therapyrecommendedskilled nursing facility.  Poor hygiene at home  Critical aortic stenosis --Continues to appear asymptomatic. Not clear whether connected to fall or not. She would be a poor candidate for any intervention at this point. Will arrange outpatient follow-up with cardiology  Dementia with acute delirium/behavioral disturbancein the setting of UTI --ContinueNamenda  PMH DVT, PE --On apixaban currently  Obesity unspecified --Pro-stat twice daily, multivitamin with minerals daily   Medically stable for discharge and discharged 11/3, however insurance authorization not yet obtained.  Stable for discharge again today.  DVT prophylaxis: apixaban Code Status: DNR Family Communication: called 2 children yesterday at number provided, no answer. Discussed care with daughter [name] today with plans for SNF. Disposition Plan: SNF    Brendia Sacks, MD  [facility] Direct contact: [phone] --Via amion app OR  --[email or web address]; password [email or web address]  7PM-7AM contact night coverage as above 08/23/2018, 9:41 AM  LOS: 4 days    Procedures:  Echo Study Conclusions  - Left ventricle: The cavity size  was normal. Wall thickness was increased in a pattern of mild LVH. Systolic function was normal. The estimated ejection fraction was in the range of 60% to 65%. Doppler parameters are consistent with abnormal left ventricular relaxation (grade 1 diastolic dysfunction). - Aortic valve: There was critical stenosis. There was mild regurgitation. Valve area (VTI): 0.72 cm^2. Valve area (Vmax): 1.4 cm^2. Valve area (Vmean): 0.75 cm^2. - Mitral valve: Severely calcified annulus. Moderately thickened, moderately calcified leaflets . There was mild regurgitation. - Left atrium: The atrium was moderately dilated. - Atrial septum: A patent foramen ovale cannot be excluded. - Pulmonary arteries: PA peak pressure: 53 mm Hg (S).  Antimicrobials:  Ceftriaxone   Interval history/Subjective: Feels ok, no complaints.  Objective: Vitals:  Vitals:   08/22/18 2154 08/23/18 0600  BP: 131/79 (!) 157/68  Pulse: 71 92  Resp: 18 (!) 22  Temp:  97.8 F (36.6 C)  SpO2: 99% 100%    Exam:  Constitutional:  . Appears mildly anxious, comfortable Respiratory:  . CTA bilaterally, no w/r/r.  . Respiratory effort normal.  Cardiovascular:  . RRR, no m/r/g . 2+  bilateral LE extremity edema   Abdomen:  . Soft, nontender, nondistended Musculoskeletal:  . Moves both feet to command Skin:  . No change in the appearance of the bilateral lower extremities.  Groin erythema from candidiasis appears resolved. Psychiatric:  . Mental status o Mood appropriate, affect slightly anxious   I have personally reviewed the following:   Data: . Blood cultures no growth to date  Scheduled Meds: . apixaban  5 mg Oral BID  . ezetimibe-simvastatin  1 tablet Oral Daily  . feeding supplement (PRO-STAT SUGAR FREE 64)  30 mL Oral BID  . furosemide  20 mg Oral Daily  . hydrocerin   Topical Daily  . memantine  10 mg Oral BID  . metoprolol succinate  25 mg Oral Daily  . multivitamin with minerals  1  tablet Oral Daily  . nystatin   Topical TID  . traMADol  50 mg Oral BID   Continuous Infusions: . cefTRIAXone (ROCEPHIN)  IV Stopped (08/22/18 1631)    Principal Problem:   UTI (urinary tract infection) Active Problems:   Pressure injury of skin   Critical aortic valve stenosis   Dementia with behavioral disturbance (HCC)   Obesity (BMI 30.0-34.9)   LOS: 4 days

## 2018-08-23 NOTE — Social Work (Addendum)
CSW spoke with pt daughter, she will be here at 12:30 to discuss SNF placement offers.   1:42pm- Spoke with daughter at bedside, she would like packet of offers (this was given) and for CSW to reach out to [employer] (completed). Insurance authorization was started today with [employer] Continues to be processed.  3:37pm- Pt daughter chose [facility], pt authorization still pending with [employer] Medicare. Paged acute rehab team for updated PT note requested by insurance.   5:15pm- Additional clinicals faxed to [facility], has bed at [facility] for tomorrow. Will need script for pain medication and gold DNR form signed, both on hard chart.    Continuing to follow.   Octavio Graves, MSW, [facility] Clinical Social Work [phone]

## 2018-08-23 NOTE — Progress Notes (Signed)
Physical Therapy Treatment Patient Details Name: [name] MRN: [id number] DOB: 1924-08-04 Today's Date: 08/23/2018    History of Present Illness 84yo female brought to the ED due to fall at home in which she hit her head. CT head negative for acute fracture or bleed, imaging otherwise negative for hip fracture. Diagnosed with UTI. PMH aortic valve disorder, OA, dementia, HTN, vertigo, glaucoma, shoulder surgery, cardiac cath     PT Comments    Pt is pleasant and agreeable to working with therapy. She is able to ambulate MinAx2 with RW for safety and chair follow, with cuing needed for proper hand placement on RW during transfers. Pt fatigues quickly with activity but is able to complete activities in therapy with intermittent seated rest breaks. Impaired skin integrity of BLE still present with "tree trunk scaling" appearance. Pt appears A&Ox3 with decreased awareness of skin condition of LE, and decreased awareness of amount of assistance needed during mobility. DC plan remains appropriate at this time.   Follow Up Recommendations  SNF     Equipment Recommendations  Other (comment)(defer to next venue )       Precautions / Restrictions Precautions Precautions: Fall Restrictions Weight Bearing Restrictions: No    Mobility   Transfers Overall transfer level: Needs assistance Equipment used: Rolling walker (2 wheeled) Transfers: Sit to/from Stand Sit to Stand: +2 physical assistance;+2 safety/equipment;Min assist;Mod assist         General transfer comment: ModAx2 at initial powerup, MinAx2 for every sit to stand after that. Vc given for hand placement throughout transfer, and to bend knees to put her feet underneath her for standing.  Ambulation/Gait Ambulation/Gait assistance: Min assist;+2 physical assistance;+2 safety/equipment Gait Distance (Feet): 8 Feet Assistive device: Rolling walker (2 wheeled) Gait Pattern/deviations: Step-through pattern;Decreased stride  length;Trunk flexed Gait velocity: slowed Gait velocity interpretation: <1.8 ft/sec, indicate of risk for recurrent falls General Gait Details: minimal cuing needed during ambulation, MinAx2 for safety and chair follow, pt quick to fatigue and had increased respiratory rate      Balance Overall balance assessment: Needs assistance;History of Falls Sitting-balance support: Bilateral upper extremity supported;Feet supported Sitting balance-Leahy Scale: Fair     Standing balance support: Bilateral upper extremity supported Standing balance-Leahy Scale: Poor                              Cognition Arousal/Alertness: Awake/alert Behavior During Therapy: WFL for tasks assessed/performed Overall Cognitive Status: History of cognitive impairments - at baseline                                 General Comments: baseline dementia; pleasant and able to follow simple commands with increased time, and verbal and tactile cuing. Decreased awareness of assistance needed as she states that she was "living on her own" and independent with toileting PTA.       Exercises Other Exercises Other Exercises: Standing Marches: AROM, 10 reps, both sides. Pt completed activity with good form and increased respiratory rate    General Comments General comments (skin integrity, edema, etc.): B LEs have significant integumentary impairment, "tree bark" look to both, pt appears unaware of condition of LE, daughter was present for duration of treatment. Pt reported that she prefers to sitting upright in chair rather than in the bed.       Pertinent Vitals/Pain Pain Assessment: Faces Faces Pain Scale: No hurt  PT Goals (current goals can now be found in the care plan section) Acute Rehab PT Goals PT Goal Formulation: With patient Time For Goal Achievement: 09/06/18 Potential to Achieve Goals: Fair    Frequency    Min 2X/week      PT Plan Current plan remains  appropriate       AM-PAC PT "6 Clicks" Daily Activity  Outcome Measure  Difficulty turning over in bed (including adjusting bedclothes, sheets and blankets)?: Unable Difficulty moving from lying on back to sitting on the side of the bed? : Unable Difficulty sitting down on and standing up from a chair with arms (e.g., wheelchair, bedside commode, etc,.)?: Unable Help needed moving to and from a bed to chair (including a wheelchair)?: A Lot Help needed walking in hospital room?: A Lot Help needed climbing 3-5 steps with a railing? : Total 6 Click Score: 8    End of Session Equipment Utilized During Treatment: Gait belt Activity Tolerance: Patient limited by fatigue;Patient tolerated treatment well Patient left: with call bell/phone within reach;in chair;with chair alarm set;with family/visitor present Nurse Communication: Mobility status PT Visit Diagnosis: Muscle weakness (generalized) (M62.81);History of falling (Z91.81);Unsteadiness on feet (R26.81);Difficulty in walking, not elsewhere classified (R26.2)     Time: 1610-9604 PT Time Calculation (min) (ACUTE ONLY): 22 min  Charges:  $Gait Training: 8-22 mins                     Rinaldo Cloud, SPT Acute Rehabilitation Services Office [phone]   Rinaldo Cloud 08/23/2018, 3:56 PM

## 2018-08-23 NOTE — Progress Notes (Signed)
Occupational Therapy Treatment Patient Details Name: [name] MRN: [id number] DOB: 04/21/1924 Today's Date: 08/23/2018    History of present illness 84yo female brought to the ED due to fall at home in which she hit her head. CT head negative for acute fracture or bleed, imaging otherwise negative for hip fracture. Diagnosed with UTI. PMH aortic valve disorder, OA, dementia, HTN, vertigo, glaucoma, shoulder surgery, cardiac cath    OT comments  Pt more alert and participating with OT. Able to wash her face and feed self after set up with intermittent S. Apparent confusion but pleasant. Pt making progress toward goals. Continue to recommend SNF for rehab.   Follow Up Recommendations  SNF;Supervision/Assistance - 24 hour    Equipment Recommendations  Other (comment)(TBA at SNF)    Recommendations for Other Services      Precautions / Restrictions Precautions Precautions: Fall       Mobility Bed Mobility Overal bed mobility: Needs Assistance Bed Mobility: Supine to Sit     Supine to sit: Max assist        Transfers Overall transfer level: Needs assistance   Transfers: Sit to/from Stand;Stand Pivot Transfers Sit to Stand: Mod assist Stand pivot transfers: Max assist            Balance Overall balance assessment: Needs assistance;History of Falls   Sitting balance-Leahy Scale: Fair       Standing balance-Leahy Scale: Poor Standing balance comment: posterior bias; kyphotic                           ADL either performed or assessed with clinical judgement   ADL Overall ADL's : Needs assistance/impaired                             Toileting- Clothing Manipulation and Hygiene: Maximal assistance       Functional mobility during ADLs: Maximal assistance General ADL Comments: Able to wash her face and hold cup to drink; Max A with LB ADL adn mod A wtih UB bathing     Vision       Perception     Praxis      Cognition  Arousal/Alertness: Awake/alert Behavior During Therapy: Restless Overall Cognitive Status: No family/caregiver present to determine baseline cognitive functioning                                          Exercises     Shoulder Instructions       General Comments      Pertinent Vitals/ Pain       Pain Assessment: Faces Faces Pain Scale: No hurt  Home Living                                          Prior Functioning/Environment              Frequency  Min 2X/week        Progress Toward Goals  OT Goals(current goals can now be found in the care plan section)  Progress towards OT goals: Progressing toward goals  Acute Rehab OT Goals Patient Stated Goal: to see her daughters OT Goal Formulation: Patient unable to participate in goal setting Time For  Goal Achievement: 09/03/18 Potential to Achieve Goals: Fair ADL Goals Pt Will Perform Eating: with min assist;sitting;bed level Pt Will Perform Grooming: with mod assist;sitting Additional ADL Goal #1: Pt will perform bed mobility with moderate assistance for positioning and to get to EOB for ADL. Additional ADL Goal #2: Pt will sit EOB with min guard assist x 5 minutes as a precursor to ADL. Additional ADL Goal #3: Pt will follow one step commands 50% of time.  Plan Discharge plan remains appropriate    Co-evaluation                 AM-PAC PT "6 Clicks" Daily Activity     Outcome Measure   Help from another person eating meals?: A Little Help from another person taking care of personal grooming?: A Lot Help from another person toileting, which includes using toliet, bedpan, or urinal?: Total Help from another person bathing (including washing, rinsing, drying)?: A Lot Help from another person to put on and taking off regular upper body clothing?: A Lot Help from another person to put on and taking off regular lower body clothing?: Total 6 Click Score: 11    End of  Session Equipment Utilized During Treatment: Gait belt  OT Visit Diagnosis: Cognitive communication deficit (R41.841);Muscle weakness (generalized) (M62.81);History of falling (Z91.81)   Activity Tolerance Patient tolerated treatment well   Patient Left in chair;with call bell/phone within reach;with chair alarm set   Nurse Communication Mobility status        Time: 7829-5621 OT Time Calculation (min): 27 min  Charges: OT General Charges $OT Visit: 1 Visit OT Treatments $Self Care/Home Management : 23-37 mins  Luisa Dago, OT/L   Acute OT Clinical Specialist Acute Rehabilitation Services Pager [phone] Office [phone]    [facility] 08/23/2018, 10:53 AM

## 2018-08-24 NOTE — Progress Notes (Signed)
Report called to [facility] at [facility]

## 2018-08-24 NOTE — Social Work (Signed)
Authorization [id number] Approved for 3 days- review date of 11/7 546 minutes per week  of therapy PDPM- PT/OT: TJ ST: SB NTA: NF RN: PBC1   Clinical Social Worker facilitated patient discharge including contacting patient family and facility to confirm patient discharge plans.  Clinical information faxed to facility and family agreeable with plan.  CSW arranged ambulance transport via [facility] to [facility]. RN to call [phone]  with report prior to discharge.  Clinical Social Worker will sign off for now as social work intervention is no longer needed. Please consult [place] again if new need arises.  Doy Hutching, [place] Clinical Social Worker [phone]

## 2018-08-24 NOTE — Clinical Social Work Placement (Signed)
   CLINICAL SOCIAL WORK PLACEMENT  NOTE [facility] RN to call report to [phone]  Date:  08/24/2018  Patient Details  Name: [name] MRN: [id number] Date of Birth: 03/24/1924  Clinical Social Work is seeking post-discharge placement for this patient at the Skilled  Nursing Facility level of care (*CSW will initial, date and re-position this form in  chart as items are completed):  Yes   Patient/family provided with [facility] Clinical Social Work Department's list of facilities offering this level of care within the geographic area requested by the patient (or if unable, by the patient's family).  Yes   Patient/family informed of their freedom to choose among providers that offer the needed level of care, that participate in Medicare, Medicaid or managed care program needed by the patient, have an available bed and are willing to accept the patient.  Yes   Patient/family informed of [facility]'s ownership interest in [facility] and [facility], as well as of the fact that they are under no obligation to receive care at these facilities.  PASRR submitted to EDS on 08/20/18     PASRR number received on       Existing PASRR number confirmed on 08/20/18     FL2 transmitted to all facilities in geographic area requested by pt/family on 08/20/18     FL2 transmitted to all facilities within larger geographic area on       Patient informed that his/her managed care company has contracts with or will negotiate with certain facilities, including the following:        Yes   Patient/family informed of bed offers received.  Patient chooses bed at [facility]     Physician recommends and patient chooses bed at      Patient to be transferred to [facility] on 08/24/18.  Patient to be transferred to facility by [facility]     Patient family notified on 08/24/18 of transfer.  Name of family member notified:  pt daughter [name]     PHYSICIAN     Additional Comment:    _______________________________________________ Doy Hutching, LCSWA 08/24/2018, 9:50 AM

## 2018-08-24 NOTE — Progress Notes (Signed)
PROGRESS NOTE  [name] UJW:[id number] DOB: 07-03-1924 DOA: 08/18/2018 PCP: Merri Brunette, MD  Brief Narrative: 84 year old woman brought to the emergency department status post fall at home, hit head, on apixaban, found to have poor hygiene, low-grade temperature, sinus tachycardia, evidence of poor hygiene, self-care. Admitted for UTI, candidiasis, possible cellulitis of the inguinal folds, buttocks, groin.  Assessment/Plan E coliUTI --resolved.  Completed ceftriaxone course.  Cutaneous candidiasis, possible cellulitis inguinal folds, buttocks, groin; chronic venous stasis bilateral legs --Resolved.  Continue topical treatment with nystatin.  Fall at home with closed head injury on apixaban. CT head no acute abnormalities. --Therapyrecommendedskilled nursing facility.  Poor hygiene at home  Critical aortic stenosis --Asymptomatic. Not clear whether connected to fall or not. She would be a poor candidate for any intervention at this point. Have arranged outpatient follow-up with cardiology  Dementia with acute delirium/behavioral disturbancein the setting of UTI --ContinueNamenda  PMH DVT, PE --On apixaban currently  Obesity unspecified --Pro-stat twice daily, multivitamin with minerals daily   Medically stable for discharge and discharged 11/3, however insurance authorization just obtained today.  Stablefor discharge again today.  DVT prophylaxis: apixaban Code Status: DNR Family Communication: Discussed care with daughter [name] yesterday Disposition Plan: SNF    Brendia Sacks, MD  [facility] Direct contact: [phone] --Via amion app OR  --[email or web address]; password [email or web address]  7PM-7AM contact night coverage as above 08/24/2018, 9:28 AM  LOS: 5 days    Procedures:  Echo Study Conclusions  - Left ventricle: The cavity size was normal. Wall thickness was increased in a pattern of mild LVH. Systolic function was normal. The  estimated ejection fraction was in the range of 60% to 65%. Doppler parameters are consistent with abnormal left ventricular relaxation (grade 1 diastolic dysfunction). - Aortic valve: There was critical stenosis. There was mild regurgitation. Valve area (VTI): 0.72 cm^2. Valve area (Vmax): 1.4 cm^2. Valve area (Vmean): 0.75 cm^2. - Mitral valve: Severely calcified annulus. Moderately thickened, moderately calcified leaflets . There was mild regurgitation. - Left atrium: The atrium was moderately dilated. - Atrial septum: A patent foramen ovale cannot be excluded. - Pulmonary arteries: PA peak pressure: 53 mm Hg (S).  Antimicrobials:  Ceftriaxone   Interval history/Subjective: Feels ok.  No complaints.  Objective: Vitals:  Vitals:   08/23/18 1710 08/23/18 2118  BP: (!) 144/81 (!) 137/52  Pulse: 92 75  Resp: 18   Temp: 97.6 F (36.4 C) 97.8 F (36.6 C)  SpO2: 100% 97%    Exam: Constitutional:   . Appears calm and comfortable Respiratory:  . CTA bilaterally, no w/r/r.  . Respiratory effort normal. No retractions or accessory muscle use Cardiovascular:  . RRR, no m/r/g . No change in 2+ BLE extremity brawny edema   Musculoskeletal:  . Moves arms and legs to command Skin:  . No change in brawny, chronic skin changes BLE Psychiatric:  . Mental status o Mood, affect appropriate   I have personally reviewed the following:   Data: . Blood cultures no growth, final  Scheduled Meds: . apixaban  5 mg Oral BID  . ezetimibe-simvastatin  1 tablet Oral Daily  . feeding supplement (PRO-STAT SUGAR FREE 64)  30 mL Oral BID  . furosemide  20 mg Oral Daily  . hydrocerin   Topical Daily  . memantine  10 mg Oral BID  . metoprolol succinate  25 mg Oral Daily  . multivitamin with minerals  1 tablet Oral Daily  . nystatin   Topical TID  . traMADol  50 mg Oral BID   Continuous Infusions: . cefTRIAXone (ROCEPHIN)  IV 1 g (08/23/18 1715)    Principal Problem:    UTI (urinary tract infection) Active Problems:   Pressure injury of skin   Critical aortic valve stenosis   Dementia with behavioral disturbance (HCC)   Obesity (BMI 30.0-34.9)   LOS: 5 days

## 2018-09-02 ENCOUNTER — Emergency Department (HOSPITAL_COMMUNITY): Payer: Medicare Other

## 2018-09-02 ENCOUNTER — Other Ambulatory Visit: Payer: Self-pay

## 2018-09-02 ENCOUNTER — Encounter (HOSPITAL_COMMUNITY): Payer: Self-pay | Admitting: Emergency Medicine

## 2018-09-02 ENCOUNTER — Inpatient Hospital Stay (HOSPITAL_COMMUNITY)
Admission: EM | Admit: 2018-09-02 | Discharge: 2018-09-08 | DRG: 988 | Disposition: A | Discharge status: Skilled Nursing Facility | Payer: Medicare Other | Attending: Family Medicine | Admitting: Family Medicine

## 2018-09-02 DIAGNOSIS — I110 Hypertensive heart disease with heart failure: Secondary | ICD-10-CM | POA: Diagnosis present

## 2018-09-02 DIAGNOSIS — Z515 Encounter for palliative care: Secondary | ICD-10-CM | POA: Diagnosis present

## 2018-09-02 DIAGNOSIS — S066X9A Traumatic subarachnoid hemorrhage with loss of consciousness of unspecified duration, initial encounter: Principal | ICD-10-CM | POA: Diagnosis present

## 2018-09-02 DIAGNOSIS — S066X0A Traumatic subarachnoid hemorrhage without loss of consciousness, initial encounter: Secondary | ICD-10-CM | POA: Diagnosis not present

## 2018-09-02 DIAGNOSIS — Z86711 Personal history of pulmonary embolism: Secondary | ICD-10-CM

## 2018-09-02 DIAGNOSIS — E669 Obesity, unspecified: Secondary | ICD-10-CM | POA: Diagnosis present

## 2018-09-02 DIAGNOSIS — I609 Nontraumatic subarachnoid hemorrhage, unspecified: Secondary | ICD-10-CM

## 2018-09-02 DIAGNOSIS — W050XXA Fall from non-moving wheelchair, initial encounter: Secondary | ICD-10-CM | POA: Diagnosis present

## 2018-09-02 DIAGNOSIS — Y92129 Unspecified place in nursing home as the place of occurrence of the external cause: Secondary | ICD-10-CM

## 2018-09-02 DIAGNOSIS — I824Z1 Acute embolism and thrombosis of unspecified deep veins of right distal lower extremity: Secondary | ICD-10-CM

## 2018-09-02 DIAGNOSIS — Z7189 Other specified counseling: Secondary | ICD-10-CM | POA: Diagnosis not present

## 2018-09-02 DIAGNOSIS — Z87440 Personal history of urinary (tract) infections: Secondary | ICD-10-CM | POA: Diagnosis not present

## 2018-09-02 DIAGNOSIS — Z7901 Long term (current) use of anticoagulants: Secondary | ICD-10-CM

## 2018-09-02 DIAGNOSIS — I2699 Other pulmonary embolism without acute cor pulmonale: Secondary | ICD-10-CM | POA: Diagnosis not present

## 2018-09-02 DIAGNOSIS — R451 Restlessness and agitation: Secondary | ICD-10-CM | POA: Diagnosis present

## 2018-09-02 DIAGNOSIS — S0181XA Laceration without foreign body of other part of head, initial encounter: Secondary | ICD-10-CM | POA: Diagnosis present

## 2018-09-02 DIAGNOSIS — F0390 Unspecified dementia without behavioral disturbance: Secondary | ICD-10-CM | POA: Diagnosis present

## 2018-09-02 DIAGNOSIS — Z8249 Family history of ischemic heart disease and other diseases of the circulatory system: Secondary | ICD-10-CM

## 2018-09-02 DIAGNOSIS — Z818 Family history of other mental and behavioral disorders: Secondary | ICD-10-CM | POA: Diagnosis not present

## 2018-09-02 DIAGNOSIS — I509 Heart failure, unspecified: Secondary | ICD-10-CM | POA: Diagnosis present

## 2018-09-02 DIAGNOSIS — Z86718 Personal history of other venous thrombosis and embolism: Secondary | ICD-10-CM | POA: Diagnosis not present

## 2018-09-02 DIAGNOSIS — I10 Essential (primary) hypertension: Secondary | ICD-10-CM

## 2018-09-02 DIAGNOSIS — J42 Unspecified chronic bronchitis: Secondary | ICD-10-CM | POA: Diagnosis present

## 2018-09-02 DIAGNOSIS — D62 Acute posthemorrhagic anemia: Secondary | ICD-10-CM | POA: Diagnosis not present

## 2018-09-02 DIAGNOSIS — Z66 Do not resuscitate: Secondary | ICD-10-CM | POA: Diagnosis present

## 2018-09-02 DIAGNOSIS — E785 Hyperlipidemia, unspecified: Secondary | ICD-10-CM | POA: Diagnosis present

## 2018-09-02 DIAGNOSIS — I350 Nonrheumatic aortic (valve) stenosis: Secondary | ICD-10-CM | POA: Diagnosis present

## 2018-09-02 DIAGNOSIS — W19XXXA Unspecified fall, initial encounter: Secondary | ICD-10-CM

## 2018-09-02 DIAGNOSIS — S066XAA Traumatic subarachnoid hemorrhage with loss of consciousness status unknown, initial encounter: Secondary | ICD-10-CM | POA: Diagnosis present

## 2018-09-02 DIAGNOSIS — W19XXXD Unspecified fall, subsequent encounter: Secondary | ICD-10-CM | POA: Diagnosis not present

## 2018-09-02 DIAGNOSIS — E7800 Pure hypercholesterolemia, unspecified: Secondary | ICD-10-CM | POA: Diagnosis present

## 2018-09-02 HISTORY — DX: Anxiety disorder, unspecified: F41.9

## 2018-09-02 HISTORY — DX: Unspecified place in nursing home as the place of occurrence of the external cause: Y92.129

## 2018-09-02 HISTORY — DX: Unspecified fall, initial encounter: W19.XXXA

## 2018-09-02 HISTORY — DX: Cardiac murmur, unspecified: R01.1

## 2018-09-02 HISTORY — DX: Urinary tract infection, site not specified: N39.0

## 2018-09-02 HISTORY — DX: Unspecified chronic bronchitis: J42

## 2018-09-02 LAB — CBC WITH DIFFERENTIAL/PLATELET
Abs Immature Granulocytes: 0.05 10*3/uL (ref 0.00–0.07)
BASOS PCT: 1 %
Basophils Absolute: 0.1 10*3/uL (ref 0.0–0.1)
EOS ABS: 0.2 10*3/uL (ref 0.0–0.5)
Eosinophils Relative: 2 %
HCT: 42.4 % (ref 36.0–46.0)
Hemoglobin: 12.7 g/dL (ref 12.0–15.0)
Immature Granulocytes: 1 %
Lymphocytes Relative: 32 %
Lymphs Abs: 3.1 10*3/uL (ref 0.7–4.0)
MCH: 28.2 pg (ref 26.0–34.0)
MCHC: 30 g/dL (ref 30.0–36.0)
MCV: 94 fL (ref 80.0–100.0)
MONO ABS: 0.6 10*3/uL (ref 0.1–1.0)
Monocytes Relative: 7 %
Neutro Abs: 5.6 10*3/uL (ref 1.7–7.7)
Neutrophils Relative %: 57 %
PLATELETS: 281 10*3/uL (ref 150–400)
RBC: 4.51 MIL/uL (ref 3.87–5.11)
RDW: 15.9 % — ABNORMAL HIGH (ref 11.5–15.5)
WBC: 9.6 10*3/uL (ref 4.0–10.5)
nRBC: 0 % (ref 0.0–0.2)

## 2018-09-02 LAB — BASIC METABOLIC PANEL
Anion gap: 9 (ref 5–15)
BUN: 22 mg/dL (ref 8–23)
CO2: 27 mmol/L (ref 22–32)
CREATININE: 0.85 mg/dL (ref 0.44–1.00)
Calcium: 8.8 mg/dL — ABNORMAL LOW (ref 8.9–10.3)
Chloride: 103 mmol/L (ref 98–111)
GFR calc Af Amer: >60 mL/min (ref >60)
GFR calc non Af Amer: 58 mL/min — ABNORMAL LOW (ref >60)
Glucose, Bld: 158 mg/dL — ABNORMAL HIGH (ref 70–99)
Potassium: 4 mmol/L (ref 3.5–5.1)
SODIUM: 139 mmol/L (ref 135–145)

## 2018-09-02 LAB — CBC
HEMATOCRIT: 38.9 % (ref 36.0–46.0)
Hemoglobin: 11.4 g/dL — ABNORMAL LOW (ref 12.0–15.0)
MCH: 27.2 pg (ref 26.0–34.0)
MCHC: 29.3 g/dL — AB (ref 30.0–36.0)
MCV: 92.8 fL (ref 80.0–100.0)
NRBC: 0 % (ref 0.0–0.2)
Platelets: 278 10*3/uL (ref 150–400)
RBC: 4.19 MIL/uL (ref 3.87–5.11)
RDW: 15.8 % — AB (ref 11.5–15.5)
WBC: 10.3 10*3/uL (ref 4.0–10.5)

## 2018-09-02 LAB — PROTIME-INR
INR: 1.15
Prothrombin Time: 14.6 seconds (ref 11.4–15.2)

## 2018-09-02 LAB — APTT: aPTT: 36 seconds (ref 24–36)

## 2018-09-02 LAB — I-STAT TROPONIN, ED: TROPONIN I, POC: 0.01 ng/mL (ref 0.00–0.08)

## 2018-09-02 MED ORDER — ACETAMINOPHEN 650 MG RE SUPP: 650.0000 mg | RECTAL | Status: DC | PRN

## 2018-09-02 MED ORDER — HYDROCERIN EX CREA
1.0000 "application " | TOPICAL_CREAM | Freq: Every day | CUTANEOUS | Status: DC
  Administered 2018-09-02 – 2018-09-08 (×6): 1 via TOPICAL
  Filled 2018-09-02: qty 113

## 2018-09-02 MED ORDER — INFLUENZA VAC SPLIT HIGH-DOSE 0.5 ML IM SUSY
0.5000 mL | PREFILLED_SYRINGE | INTRAMUSCULAR | Status: DC
  Filled 2018-09-02: qty 0.5

## 2018-09-02 MED ORDER — PANTOPRAZOLE SODIUM 40 MG PO PACK: 40.0000 mg | PACK | Freq: Every day | ORAL | Status: DC

## 2018-09-02 MED ORDER — PANTOPRAZOLE SODIUM 40 MG PO TBEC
40.0000 mg | DELAYED_RELEASE_TABLET | Freq: Every day | ORAL | Status: DC
  Filled 2018-09-02: qty 1

## 2018-09-02 MED ORDER — STROKE: EARLY STAGES OF RECOVERY BOOK: Freq: Once | Status: DC

## 2018-09-02 MED ORDER — ACETAMINOPHEN 325 MG PO TABS: 650.0000 mg | ORAL_TABLET | ORAL | Status: DC | PRN

## 2018-09-02 MED ORDER — ONDANSETRON HCL 4 MG/2ML IJ SOLN: 4.0000 mg | Freq: Four times a day (QID) | INTRAMUSCULAR | Status: DC | PRN

## 2018-09-02 MED ORDER — SODIUM CHLORIDE 0.9 % IV SOLN
INTRAVENOUS | Status: DC
  Administered 2018-09-04 – 2018-09-07 (×5): via INTRAVENOUS

## 2018-09-02 MED ORDER — LEVETIRACETAM IN NACL 500 MG/100ML IV SOLN
500.0000 mg | Freq: Two times a day (BID) | INTRAVENOUS | Status: DC
  Administered 2018-09-02 – 2018-09-08 (×12): 500 mg via INTRAVENOUS
  Filled 2018-09-02 (×12): qty 100

## 2018-09-02 MED ORDER — LIDOCAINE HCL 2 % IJ SOLN: 10.0000 mL | Freq: Once | INTRAMUSCULAR | Status: DC

## 2018-09-02 MED ORDER — MORPHINE SULFATE (PF) 2 MG/ML IV SOLN
2.0000 mg | INTRAVENOUS | Status: DC | PRN
  Administered 2018-09-02 – 2018-09-06 (×10): 2 mg via INTRAVENOUS
  Filled 2018-09-02 (×10): qty 1

## 2018-09-02 MED ORDER — FENTANYL CITRATE (PF) 100 MCG/2ML IJ SOLN
25.0000 ug | Freq: Once | INTRAMUSCULAR | Status: AC
  Administered 2018-09-02: 25 ug via INTRAMUSCULAR
  Filled 2018-09-02: qty 2

## 2018-09-02 MED ORDER — NYSTATIN 100000 UNIT/GM EX POWD
Freq: Three times a day (TID) | CUTANEOUS | Status: DC
  Administered 2018-09-02 – 2018-09-04 (×5): via TOPICAL
  Administered 2018-09-04: 1 g via TOPICAL
  Administered 2018-09-04: 16:00:00 via TOPICAL
  Administered 2018-09-05: 1 g via TOPICAL
  Administered 2018-09-05 – 2018-09-08 (×8): via TOPICAL
  Filled 2018-09-02: qty 15

## 2018-09-02 MED ORDER — LORAZEPAM 2 MG/ML IJ SOLN
0.5000 mg | Freq: Once | INTRAMUSCULAR | Status: AC
  Administered 2018-09-02: 0.5 mg via INTRAVENOUS
  Filled 2018-09-02: qty 1

## 2018-09-02 MED ORDER — FEIBA NF IV SOLR: 25.0000 [IU]/kg | Status: DC

## 2018-09-02 MED ORDER — DOCUSATE SODIUM 100 MG PO CAPS
100.0000 mg | ORAL_CAPSULE | Freq: Two times a day (BID) | ORAL | Status: DC
  Administered 2018-09-07: 100 mg via ORAL
  Filled 2018-09-02 (×5): qty 1

## 2018-09-02 MED ORDER — ACETAMINOPHEN 160 MG/5ML PO SOLN: 650.0000 mg | ORAL | Status: DC | PRN

## 2018-09-02 MED ORDER — ONDANSETRON 4 MG PO TBDP: 4.0000 mg | ORAL_TABLET | Freq: Four times a day (QID) | ORAL | Status: DC | PRN

## 2018-09-02 MED ORDER — PROTHROMBIN COMPLEX CONC HUMAN 500 UNITS IV KIT
4352.0000 [IU] | PACK | Status: AC
  Administered 2018-09-02: 4352 [IU] via INTRAVENOUS
  Filled 2018-09-02: qty 1352

## 2018-09-02 NOTE — ED Notes (Signed)
Pt back from CT with RN, patient noted to call out for her "mama", "please just cut the cord!"

## 2018-09-02 NOTE — ED Notes (Signed)
Patient transported to CT 

## 2018-09-02 NOTE — ED Provider Notes (Signed)
Medical screening examination/treatment/procedure(s) were conducted as a shared visit with non-physician practitioner(s) and myself.  I personally evaluated the patient during the encounter. Briefly, the patient is a 84 y.o. female is a 84 year old female with history of pulmonary embolism on Eliquis, generalized weakness, dementia with behavioral disturbance with prior history of subarachnoid hemorrhage who presents the ED following fall.  Patient according to nursing home fell out of her wheelchair striking the right side of her head.  Patient has 2 lacerations over the right side of her forehead.  Patient is agitated upon my examination.  However she does appear to move all of her extremities.  She is chronically ill-appearing and overall looks to be in poor overall health.  She is unable to fully participate in a neurological exam secondary to her dementia and also fully unable to give history due to her dementia.  Level 5 caveat secondary to dementia and history was obtained by EMS and family member.  Due to concern for trauma and that patient is on a blood thinner CT scan was done of the head, face, neck.  Basic labs were ordered.  Radiology called my physician assistant to state that patient has subarachnoid hemorrhage likely traumatic but also likely has several other smaller head bleeds likely secondary to metastasis or other process.  Pharmacy was consulted for recommendations for anticoagulation reversal given that patient is on apixaban.  They recommend Kcentra.  Patient family members at the bedside and had long discussion with the family member about whether or not to make the patient comfort care/hospice.  Patient is already DNR.  Family member at this time would like to talk with neurosurgery and have admission and discuss case with surgeons and palliative care.  She would not like comfort care palliative care at this time which is my recommendation.  Theodoro ParmaKcentra was given.  Basic labs were overall  unremarkable.  Patient to be evaluated by neurosurgery and to be admitted to hospitalist service.  Suspect that patient will have poor outcome from this head bleed.  This chart was dictated using voice recognition software.  Despite best efforts to proofread,  errors can occur which can change the documentation meaning.   .Critical Care Performed by: Virgina Norfolkuratolo, Kynlei Piontek, DO Authorized by: Virgina Norfolkuratolo, Tobias Avitabile, DO   Critical care provider statement:    Critical care time (minutes):  45   Critical care time was exclusive of:  Separately billable procedures and treating other patients and teaching time   Critical care was necessary to treat or prevent imminent or life-threatening deterioration of the following conditions:  CNS failure or compromise   Critical care was time spent personally by me on the following activities:  Development of treatment plan with patient or surrogate, discussions with consultants, discussions with primary provider, examination of patient, obtaining history from patient or surrogate, ordering and performing treatments and interventions, ordering and review of laboratory studies, ordering and review of radiographic studies, pulse oximetry and re-evaluation of patient's condition   I assumed direction of critical care for this patient from another provider in my specialty: no       EKG Interpretation  Date/Time:  Thursday September 02 2018 14:54:25 EST Ventricular Rate:  80 PR Interval:    QRS Duration: 85 QT Interval:  419 QTC Calculation: 484 R Axis:   23 Text Interpretation:  Sinus rhythm Consider left atrial enlargement Confirmed by Virgina NorfolkAdam, Ronelle Michie [phone]([id number]) on 09/02/2018 6:30:57 PM          Virgina Norfolkuratolo, Zniyah Midkiff, DO 09/02/18 1832

## 2018-09-02 NOTE — ED Triage Notes (Signed)
[facility]-Pt appears wheel chair bound and is presumed she fell from her wheelchair. Pt has two laceration on her forehead bleeding controlled with sterile gauze- left thumb deformity. Pt is alert and oriented to person and place- not time or situation.

## 2018-09-02 NOTE — Progress Notes (Signed)
CSW responded to consult. Consult placed to assist with locating pt's daughter. Per EDP pt is at bedside.   CSW will continue to follow if other social work needs arise.   Montine CircleKelsy Rosalena Mccorry, Silverio LayLCSWA [facility]  [phone]

## 2018-09-02 NOTE — ED Notes (Signed)
Attempted report x1. 

## 2018-09-02 NOTE — ED Provider Notes (Signed)
[facility] EMERGENCY DEPARTMENT Provider Note  CSN: [id number] Arrival date & time: 09/02/18  1356  History   Chief Complaint Chief Complaint  Patient presents with  . Fall    Past Medical History:  Diagnosis Date  . Allergic rhinitis   . Aortic valve disorders   . Arthritis   . Dementia (HCC)   . Dyslipidemia   . Glaucoma   . Hypercholesteremia   . Hypertension   . Memory loss   . Obesity   . Vertigo     Patient Active Problem List   Diagnosis Date Noted  . Subarachnoid hematoma (HCC) 09/02/2018  . Pressure injury of skin 08/20/2018  . Critical aortic valve stenosis 08/20/2018  . Dementia with behavioral disturbance (HCC) 08/20/2018  . Obesity (BMI 30.0-34.9) 08/20/2018  . UTI (urinary tract infection) 08/18/2018  . Lower leg DVT (deep venous thrombosis), right 12/26/2015  . Generalized weakness 12/26/2015  . Pulmonary embolism without acute cor pulmonale (HCC) 12/26/2015  . Humerus fracture 12/18/2015  . Pulmonary embolism (HCC) 12/18/2015  . Acute encephalopathy   . Fracture of humerus, left, closed 12/17/2015  . SAH (subarachnoid hemorrhage) (HCC) 11/16/2015  . Diastolic dysfunction-grade 2 11/16/2015  . Wrist fracture 11/14/2015  . Fall 11/14/2015  . Essential hypertension   . Hypercholesteremia   . Allergic rhinitis   . Arthritis   . Dyslipidemia   . Obesity     Past Surgical History:  Procedure Laterality Date  . APPENDECTOMY    . bilataral cataracts removal with lens replacement Dr. Darel Hong    . CARDIAC CATHETERIZATION     normal coronary arteries  . SHOULDER SURGERY       OB History   None      Home Medications    Prior to Admission medications   Medication Sig Start Date End Date Taking? Authorizing Provider  furosemide (LASIX) 20 MG tablet Take 1 tablet (20 mg total) by mouth daily. 08/22/18  Yes Standley Brooking, MD  memantine (NAMENDA) 10 MG tablet Take 1 tablet (10 mg total) by mouth 2 (two) times daily.  01/12/18  Yes Butch Penny, NP  metoprolol succinate (TOPROL-XL) 25 MG 24 hr tablet Take 1 tablet (25 mg total) by mouth daily. 07/17/15  Yes Robbie Lis M, PA-C  Multiple Vitamin (MULTIVITAMIN WITH MINERALS) TABS tablet Take 1 tablet by mouth daily. 08/23/18  Yes Standley Brooking, MD  polyethylene glycol [facility] / Ethelene Hal) packet Take 17 g by mouth daily as needed for mild constipation.    Yes [provider]  Amino Acids-Protein Hydrolys (FEEDING SUPPLEMENT, PRO-STAT SUGAR FREE 64,) LIQD Take 30 mLs by mouth 2 (two) times daily. 08/22/18   Standley Brooking, MD  ezetimibe-simvastatin (VYTORIN) 10-20 MG per tablet Take 1 tablet by mouth daily.     [provider]  hydrocerin (EUCERIN) CREA Apply 1 application topically daily. Apply Eucerin cream to bilat legs Q day after bathing and roughly towel drying to assist with removal of loose skin 08/23/18   Standley Brooking, MD  nystatin (MYCOSTATIN/NYSTOP) powder Apply topically 3 (three) times daily. Apply to affected areas for cutaneous candidiasis 08/22/18   Standley Brooking, MD  traMADol (ULTRAM) 50 MG tablet Take 1 tablet (50 mg total) by mouth 2 (two) times daily. 08/22/18   Standley Brooking, MD   Family History Family History  Problem Relation Age of Onset  . Heart disease Sister   . Stroke Neg Hx   . CAD Neg Hx   .  Cancer Neg Hx     Social History Social History   Tobacco Use  . Smoking status: Never Smoker  . Smokeless tobacco: Never Used  Substance Use Topics  . Alcohol use: No    Alcohol/week: 0.0 standard drinks  . Drug use: No   Allergies   Patient has no known allergies.   Review of Systems Review of Systems  Performed by intial EDP Cortni Couture, PA-C.  Physical Exam Updated Vital Signs BP 119/77   Pulse (!) 102   Temp (!) 97.5 F (36.4 C) (Rectal)   Resp 18   SpO2 99%   Physical Exam Performed by initial EDP Courtni Couture, PA-C.  ED Treatments / Results  Labs (all labs  ordered are listed, but only abnormal results are displayed) Labs Reviewed  CBC WITH DIFFERENTIAL/PLATELET - Abnormal; Notable for the following components:      Result Value   RDW 15.9 (*)    All other components within normal limits  BASIC METABOLIC PANEL - Abnormal; Notable for the following components:   Glucose, Bld 158 (*)    Calcium 8.8 (*)    GFR calc non Af Amer 58 (*)    All other components within normal limits  URINALYSIS, ROUTINE W REFLEX MICROSCOPIC  I-STAT TROPONIN, ED    EKG None  Radiology Ct Head Wo Contrast  Result Date: 09/02/2018 CLINICAL DATA:  84 y/o F; unwitnessed fall. Laceration on the forehead with bleeding control. EXAM: CT HEAD WITHOUT CONTRAST CT MAXILLOFACIAL WITHOUT CONTRAST CT CERVICAL SPINE WITHOUT CONTRAST TECHNIQUE: Multidetector CT imaging of the head, cervical spine, and maxillofacial structures were performed using the standard protocol without intravenous contrast. Multiplanar CT image reconstructions of the cervical spine and maxillofacial structures were also generated. COMPARISON:  08/18/2018 CT head. FINDINGS: CT HEAD FINDINGS Brain: Acute hemorrhage within the paramedian parietal lobes bilaterally with subarachnoid hemorrhage along the dorsal falx. Mild local mass effect without herniation. The larger left parietal focus of hemorrhage measures 2.0 x 1.6 x 2.4 cm (volume = 4 cm^3)(AP x ML x CC series 9, image 23 and series 11, image 56). There are several additional subcentimeter hyperdense foci scattered throughout the brain that are rounded and cortically based for example in the right parietal lobe (series 9, image 27). No hydrocephalus, findings of stroke, mass effect, or herniation. Nonspecific white matter hypodensities are compatible with mild to moderate chronic microvascular ischemic changes and there is stable volume loss of the brain. Vascular: Calcific atherosclerosis of carotid siphons. No hyperdense vessel identified. Skull: Motion  artifact, no grossly displaced calvarial fracture. Other: None. CT MAXILLOFACIAL FINDINGS Osseous: Extensive motion artifact. No grossly displaced facial fracture identified. No mandibular dislocation. Orbits: Negative. No traumatic or inflammatory finding. Sinuses: Clear. Soft tissues: Negative. CT CERVICAL SPINE FINDINGS Alignment: C6-7, C7-T1, T1-2 grade 1 anterolisthesis is stable. Straightening of cervical lordosis. Skull base and vertebrae: No acute fracture. No primary bone lesion or focal pathologic process. Soft tissues and spinal canal: No prevertebral fluid or swelling. No visible canal hematoma. Disc levels: Advanced disc and facet degenerative changes greatest at the C4-5 level where prominent disc osteophyte complex results in at least moderate spinal canal stenosis. Multilevel facet effusion, likely on degenerative basis. Upper chest: Negative. Other: Calcific atherosclerosis of carotid bifurcations. IMPRESSION: CT head: 1. Motion degraded study.  No gross calvarial fracture. 2. Acute hemorrhages in the paramedian parietal lobes bilaterally measuring up to 2.4 cm, 4 cc with small volume of subarachnoid hemorrhage along dorsal falx. Mild local mass effect without  herniation. 3. Several additional tiny acute hemorrhages scattered throughout the brain in cortical distribution are somewhat atypical for trauma and may represent microhemorrhage related to amyloid disease, hypertension, or possibly metastasis. When patient is able to hold still, consider MRI of the brain without and with contrast. 4. Stable mild to moderate chronic microvascular ischemic changes and volume loss of the brain for age. CT maxillofacial: 1. Motion artifact, no grossly displaced facial fracture identified. No mandibular dislocation. CT cervical spine: 1. No acute fracture or dislocation identified. 2. Stable advanced cervical spondylosis greatest at C4-5 level where there is at least moderate spinal canal stenosis. Critical  Value/emergent results were called by telephone at the time of interpretation on 09/02/2018 at 3:36 pm to Dr. Leonia Corona , who verbally acknowledged these results. Electronically Signed   By: Mitzi Hansen M.D.   On: 09/02/2018 15:40   Ct Cervical Spine Wo Contrast  Result Date: 09/02/2018 CLINICAL DATA:  84 y/o F; unwitnessed fall. Laceration on the forehead with bleeding control. EXAM: CT HEAD WITHOUT CONTRAST CT MAXILLOFACIAL WITHOUT CONTRAST CT CERVICAL SPINE WITHOUT CONTRAST TECHNIQUE: Multidetector CT imaging of the head, cervical spine, and maxillofacial structures were performed using the standard protocol without intravenous contrast. Multiplanar CT image reconstructions of the cervical spine and maxillofacial structures were also generated. COMPARISON:  08/18/2018 CT head. FINDINGS: CT HEAD FINDINGS Brain: Acute hemorrhage within the paramedian parietal lobes bilaterally with subarachnoid hemorrhage along the dorsal falx. Mild local mass effect without herniation. The larger left parietal focus of hemorrhage measures 2.0 x 1.6 x 2.4 cm (volume = 4 cm^3)(AP x ML x CC series 9, image 23 and series 11, image 56). There are several additional subcentimeter hyperdense foci scattered throughout the brain that are rounded and cortically based for example in the right parietal lobe (series 9, image 27). No hydrocephalus, findings of stroke, mass effect, or herniation. Nonspecific white matter hypodensities are compatible with mild to moderate chronic microvascular ischemic changes and there is stable volume loss of the brain. Vascular: Calcific atherosclerosis of carotid siphons. No hyperdense vessel identified. Skull: Motion artifact, no grossly displaced calvarial fracture. Other: None. CT MAXILLOFACIAL FINDINGS Osseous: Extensive motion artifact. No grossly displaced facial fracture identified. No mandibular dislocation. Orbits: Negative. No traumatic or inflammatory finding. Sinuses: Clear.  Soft tissues: Negative. CT CERVICAL SPINE FINDINGS Alignment: C6-7, C7-T1, T1-2 grade 1 anterolisthesis is stable. Straightening of cervical lordosis. Skull base and vertebrae: No acute fracture. No primary bone lesion or focal pathologic process. Soft tissues and spinal canal: No prevertebral fluid or swelling. No visible canal hematoma. Disc levels: Advanced disc and facet degenerative changes greatest at the C4-5 level where prominent disc osteophyte complex results in at least moderate spinal canal stenosis. Multilevel facet effusion, likely on degenerative basis. Upper chest: Negative. Other: Calcific atherosclerosis of carotid bifurcations. IMPRESSION: CT head: 1. Motion degraded study.  No gross calvarial fracture. 2. Acute hemorrhages in the paramedian parietal lobes bilaterally measuring up to 2.4 cm, 4 cc with small volume of subarachnoid hemorrhage along dorsal falx. Mild local mass effect without herniation. 3. Several additional tiny acute hemorrhages scattered throughout the brain in cortical distribution are somewhat atypical for trauma and may represent microhemorrhage related to amyloid disease, hypertension, or possibly metastasis. When patient is able to hold still, consider MRI of the brain without and with contrast. 4. Stable mild to moderate chronic microvascular ischemic changes and volume loss of the brain for age. CT maxillofacial: 1. Motion artifact, no grossly displaced facial fracture identified. No  mandibular dislocation. CT cervical spine: 1. No acute fracture or dislocation identified. 2. Stable advanced cervical spondylosis greatest at C4-5 level where there is at least moderate spinal canal stenosis. Critical Value/emergent results were called by telephone at the time of interpretation on 09/02/2018 at 3:36 pm to Dr. Leonia Corona , who verbally acknowledged these results. Electronically Signed   By: Mitzi Hansen M.D.   On: 09/02/2018 15:40   Dg Chest Portable 1  View  Result Date: 09/02/2018 CLINICAL DATA:  Fall, bleeding from the right side of the forehead, uncooperative patient EXAM: PORTABLE CHEST 1 VIEW COMPARISON:  Portable chest x-ray of 08/18/2017 FINDINGS: No focal pneumonia or pleural effusion is seen. Moderate cardiomegaly is stable. The descending thoracic aorta appears somewhat ectatic. Old fractures of the left and right proximal humeri are noted. The bones are osteopenic. IMPRESSION: 1. Stable moderate cardiomegaly.  No active lung disease. 2. Old fractures of the proximal humeri left-greater-than-right. There may be fusion of the left shoulder joint. Electronically Signed   By: Dwyane Dee M.D.   On: 09/02/2018 14:56   Ct Maxillofacial Wo Contrast  Result Date: 09/02/2018 CLINICAL DATA:  84 y/o F; unwitnessed fall. Laceration on the forehead with bleeding control. EXAM: CT HEAD WITHOUT CONTRAST CT MAXILLOFACIAL WITHOUT CONTRAST CT CERVICAL SPINE WITHOUT CONTRAST TECHNIQUE: Multidetector CT imaging of the head, cervical spine, and maxillofacial structures were performed using the standard protocol without intravenous contrast. Multiplanar CT image reconstructions of the cervical spine and maxillofacial structures were also generated. COMPARISON:  08/18/2018 CT head. FINDINGS: CT HEAD FINDINGS Brain: Acute hemorrhage within the paramedian parietal lobes bilaterally with subarachnoid hemorrhage along the dorsal falx. Mild local mass effect without herniation. The larger left parietal focus of hemorrhage measures 2.0 x 1.6 x 2.4 cm (volume = 4 cm^3)(AP x ML x CC series 9, image 23 and series 11, image 56). There are several additional subcentimeter hyperdense foci scattered throughout the brain that are rounded and cortically based for example in the right parietal lobe (series 9, image 27). No hydrocephalus, findings of stroke, mass effect, or herniation. Nonspecific white matter hypodensities are compatible with mild to moderate chronic microvascular  ischemic changes and there is stable volume loss of the brain. Vascular: Calcific atherosclerosis of carotid siphons. No hyperdense vessel identified. Skull: Motion artifact, no grossly displaced calvarial fracture. Other: None. CT MAXILLOFACIAL FINDINGS Osseous: Extensive motion artifact. No grossly displaced facial fracture identified. No mandibular dislocation. Orbits: Negative. No traumatic or inflammatory finding. Sinuses: Clear. Soft tissues: Negative. CT CERVICAL SPINE FINDINGS Alignment: C6-7, C7-T1, T1-2 grade 1 anterolisthesis is stable. Straightening of cervical lordosis. Skull base and vertebrae: No acute fracture. No primary bone lesion or focal pathologic process. Soft tissues and spinal canal: No prevertebral fluid or swelling. No visible canal hematoma. Disc levels: Advanced disc and facet degenerative changes greatest at the C4-5 level where prominent disc osteophyte complex results in at least moderate spinal canal stenosis. Multilevel facet effusion, likely on degenerative basis. Upper chest: Negative. Other: Calcific atherosclerosis of carotid bifurcations. IMPRESSION: CT head: 1. Motion degraded study.  No gross calvarial fracture. 2. Acute hemorrhages in the paramedian parietal lobes bilaterally measuring up to 2.4 cm, 4 cc with small volume of subarachnoid hemorrhage along dorsal falx. Mild local mass effect without herniation. 3. Several additional tiny acute hemorrhages scattered throughout the brain in cortical distribution are somewhat atypical for trauma and may represent microhemorrhage related to amyloid disease, hypertension, or possibly metastasis. When patient is able to hold still, consider MRI  of the brain without and with contrast. 4. Stable mild to moderate chronic microvascular ischemic changes and volume loss of the brain for age. CT maxillofacial: 1. Motion artifact, no grossly displaced facial fracture identified. No mandibular dislocation. CT cervical spine: 1. No acute  fracture or dislocation identified. 2. Stable advanced cervical spondylosis greatest at C4-5 level where there is at least moderate spinal canal stenosis. Critical Value/emergent results were called by telephone at the time of interpretation on 09/02/2018 at 3:36 pm to Dr. Leonia CoronaORTNI COUTURE , who verbally acknowledged these results. Electronically Signed   By: Mitzi HansenLance  Furusawa-Stratton M.D.   On: 09/02/2018 15:40    Procedures .[name].Laceration Repair Date/Time: 09/02/2018 5:17 PM Performed by: Windy CarinaMortis, Servando Kyllonen I, PA-C Authorized by: Windy CarinaMortis, Dmetrius Ambs I, PA-C   Consent:    Consent obtained:  Verbal   Consent given by:  Healthcare agent (patient's daughter)   Risks discussed:  Infection, pain, poor cosmetic result and poor wound healing   Alternatives discussed:  No treatment Anesthesia (see MAR for exact dosages):    Anesthesia method:  None Laceration details:    Location:  Face   Face location:  Forehead   Length (cm):  3   Depth (mm):  4 Repair type:    Repair type:  Simple Pre-procedure details:    Preparation:  Patient was prepped and draped in usual sterile fashion Exploration:    Hemostasis achieved with:  Direct pressure   Wound exploration: wound explored through full range of motion and entire depth of wound probed and visualized     Contaminated: no   Treatment:    Area cleansed with:  Saline Skin repair:    Repair method:  Steri-Strips   Number of Steri-Strips:  2 Approximation:    Approximation:  Close Post-procedure details:    Dressing:  Open (no dressing) Comments:     Patient too agitated to tolerate sutures.   (including critical care time)  Medications Ordered in ED Medications  prothrombin complex conc human (KCENTRA) IVPB 4,352 Units (4,352 Units Intravenous New Bag/Given 09/02/18 1703)  levETIRAcetam (KEPPRA) IVPB 500 mg/100 mL premix (has no administration in time range)  fentaNYL (SUBLIMAZE) injection 25 mcg (25 mcg Intramuscular Given 09/02/18 1451)   LORazepam (ATIVAN) injection 0.5 mg (0.5 mg Intravenous Given 09/02/18 1535)  fentaNYL (SUBLIMAZE) injection 25 mcg (25 mcg Intramuscular Given 09/02/18 1713)     Initial Impression / Assessment and Plan / ED Course  Triage vital signs and the nursing notes have been reviewed.  Pertinent labs & imaging results that were available during care of the patient were reviewed and considered in medical decision making (see chart for details). Clinical Course as of Sep 02 1716  Thu Sep 02, 2018  1633 Case discussed with Courtni Couture, PA-C at shift change. Patient is going to be admitted, but needs 2 head lacerations repaired. This EDP will perform lac repair.   [GM]    Clinical Course User Index [GM] Windy CarinaMortis, Braven Wolk I, [place]PA-C    Final diagnoses:  Fall, initial encounter  Subarachnoid hemorrhage [facility](HCC)    ED Discharge Orders    None        Reva BoresMortis, Jarek Longton I, PA-C 09/02/18 1721    Terrilee FilesButler, Michael C, MD 09/07/18 [phone]

## 2018-09-02 NOTE — H&P (Signed)
[facility] History and Physical  [name] ZOX:[id number] DOB: 1924/03/04 DOA: 09/02/2018  PCP: Merri Brunette, MD  Patient coming from: [facility]   Chief Complaint: Marletta Lor  HPI: [name] is a 84 y.o. female with a medical history of dementia, hypertension, hyperlipidemia, history of DVT/PE on Eliquis, presented to the emergency department after falling at [facility] house.  Patient is unable to provide history, some obtained from daughter, some obtained from EDP.The patient was brought in by EMS.  It seems that she had an unwitnessed fall out of her wheelchair today.  Patient did sustain some head trauma but unclear if she lost consciousness.  She does have several lacerations on her face.  It was recently hospitalized 08/18/2018 through 08/24/2018 and found to have urinary tract infection and cellulitis.  She was discharged to rehab.  Patient's daughter states that she has been taking care of her mother for the past 3 years but has been requiring assistance with ambulation as well as daily activities.  ED Course: CT scan showed subarachnoid hemorrhage.  Neurosurgery called for consult and [facility] called for admission.  Review of Systems:  Cannot obtain from patient given her current state and dementia.  Past Medical History:  Diagnosis Date  . Allergic rhinitis   . Aortic valve disorders   . Arthritis   . Dementia (HCC)   . Dyslipidemia   . Glaucoma   . Hypercholesteremia   . Hypertension   . Memory loss   . Obesity   . Vertigo     Past Surgical History:  Procedure Laterality Date  . APPENDECTOMY    . bilataral cataracts removal with lens replacement Dr. Darel Hong    . CARDIAC CATHETERIZATION     normal coronary arteries  . SHOULDER SURGERY      Social History:  reports that she has never smoked. She has never used smokeless tobacco. She reports that she does not drink alcohol or use drugs.   No Known Allergies  Family History  Problem Relation Age of  Onset  . Heart disease Sister   . Stroke Neg Hx   . CAD Neg Hx   . Cancer Neg Hx      Prior to Admission medications   Medication Sig Start Date End Date Taking? Authorizing Provider  furosemide (LASIX) 20 MG tablet Take 1 tablet (20 mg total) by mouth daily. 08/22/18  Yes Standley Brooking, MD  memantine (NAMENDA) 10 MG tablet Take 1 tablet (10 mg total) by mouth 2 (two) times daily. 01/12/18  Yes Butch Penny, NP  metoprolol succinate (TOPROL-XL) 25 MG 24 hr tablet Take 1 tablet (25 mg total) by mouth daily. 07/17/15  Yes Robbie Lis M, PA-C  Multiple Vitamin (MULTIVITAMIN WITH MINERALS) TABS tablet Take 1 tablet by mouth daily. 08/23/18  Yes Standley Brooking, MD  polyethylene glycol [facility] / Ethelene Hal) packet Take 17 g by mouth daily as needed for mild constipation.    Yes [provider]  Amino Acids-Protein Hydrolys (FEEDING SUPPLEMENT, PRO-STAT SUGAR FREE 64,) LIQD Take 30 mLs by mouth 2 (two) times daily. 08/22/18   Standley Brooking, MD  ezetimibe-simvastatin (VYTORIN) 10-20 MG per tablet Take 1 tablet by mouth daily.     [provider]  hydrocerin (EUCERIN) CREA Apply 1 application topically daily. Apply Eucerin cream to bilat legs Q day after bathing and roughly towel drying to assist with removal of loose skin 08/23/18   Standley Brooking, MD  nystatin (MYCOSTATIN/NYSTOP) powder Apply topically  3 (three) times daily. Apply to affected areas for cutaneous candidiasis 08/22/18   Standley Brooking, MD  traMADol (ULTRAM) 50 MG tablet Take 1 tablet (50 mg total) by mouth 2 (two) times daily. 08/22/18   Standley Brooking, MD    Physical Exam: Vitals:   09/02/18 1600 09/02/18 1630  BP: 113/64 119/77  Pulse:    Resp: 18   Temp:    SpO2:       General: Well developed, chronically ill-appearing, agitated  HEENT: [place], linear laceration on the right temple as well as right eyebrow with mild active bleeding; TTP above the right eyebrow, PERRLA, EOMI,  Anicteic Sclera, mucous membranes dry.  Dried blood in nostrils.  Neck: Supple, no JVD, no masses  Cardiovascular: S1 S2 auscultated, no rubs, murmurs or gallops. Regular rate and rhythm.  Respiratory: Clear to auscultation bilaterally with equal chest rise  Abdomen: Soft, TTP, nondistended, + bowel sounds  Extremities: warm dry without cyanosis clubbing or edema  Neuro: AAOx1 (self and daughter only), dementia. Calling out for her mother. Does not follow commands but moving extremities spontaneously.   Skin: Multiple ecchymosis/bruising on body; lower extremities with venous stasis skin changes and erythema  Psych: agitated, anxious and crying.  Labs on Admission: I have personally reviewed following labs and imaging studies CBC: Recent Labs  Lab 09/02/18 1431  WBC 9.6  NEUTROABS 5.6  HGB 12.7  HCT 42.4  MCV 94.0  PLT 281   Basic Metabolic Panel: Recent Labs  Lab 09/02/18 1431  NA 139  K 4.0  CL 103  CO2 27  GLUCOSE 158*  BUN 22  CREATININE 0.85  CALCIUM 8.8*   GFR: Estimated Creatinine Clearance: 40.8 mL/min (by C-G formula based on SCr of 0.85 mg/dL). Liver Function Tests: No results for input(s): AST, ALT, ALKPHOS, BILITOT, PROT, ALBUMIN in the last 168 hours. No results for input(s): LIPASE, AMYLASE in the last 168 hours. No results for input(s): AMMONIA in the last 168 hours. Coagulation Profile: No results for input(s): INR, PROTIME in the last 168 hours. Cardiac Enzymes: No results for input(s): CKTOTAL, CKMB, CKMBINDEX, TROPONINI in the last 168 hours. BNP (last 3 results) No results for input(s): PROBNP in the last 8760 hours. HbA1C: No results for input(s): HGBA1C in the last 72 hours. CBG: No results for input(s): GLUCAP in the last 168 hours. Lipid Profile: No results for input(s): CHOL, HDL, LDLCALC, TRIG, CHOLHDL, LDLDIRECT in the last 72 hours. Thyroid Function Tests: No results for input(s): TSH, T4TOTAL, FREET4, T3FREE, THYROIDAB in the  last 72 hours. Anemia Panel: No results for input(s): VITAMINB12, FOLATE, FERRITIN, TIBC, IRON, RETICCTPCT in the last 72 hours. Urine analysis:    Component Value Date/Time   COLORURINE YELLOW 08/18/2018 1538   APPEARANCEUR HAZY (A) 08/18/2018 1538   LABSPEC 1.011 08/18/2018 1538   PHURINE 6.0 08/18/2018 1538   GLUCOSEU NEGATIVE 08/18/2018 1538   HGBUR LARGE (A) 08/18/2018 1538   BILIRUBINUR NEGATIVE 08/18/2018 1538   KETONESUR 5 (A) 08/18/2018 1538   PROTEINUR NEGATIVE 08/18/2018 1538   NITRITE POSITIVE (A) 08/18/2018 1538   LEUKOCYTESUR MODERATE (A) 08/18/2018 1538   Sepsis Labs: @LABRCNTIP (procalcitonin:4,lacticidven:4) )No results found for this or any previous visit (from the past 240 hour(s)).   Radiological Exams on Admission: Ct Head Wo Contrast  Result Date: 09/02/2018 CLINICAL DATA:  84 y/o F; unwitnessed fall. Laceration on the forehead with bleeding control. EXAM: CT HEAD WITHOUT CONTRAST CT MAXILLOFACIAL WITHOUT CONTRAST CT CERVICAL SPINE WITHOUT CONTRAST TECHNIQUE: Multidetector  CT imaging of the head, cervical spine, and maxillofacial structures were performed using the standard protocol without intravenous contrast. Multiplanar CT image reconstructions of the cervical spine and maxillofacial structures were also generated. COMPARISON:  08/18/2018 CT head. FINDINGS: CT HEAD FINDINGS Brain: Acute hemorrhage within the paramedian parietal lobes bilaterally with subarachnoid hemorrhage along the dorsal falx. Mild local mass effect without herniation. The larger left parietal focus of hemorrhage measures 2.0 x 1.6 x 2.4 cm (volume = 4 cm^3)(AP x ML x CC series 9, image 23 and series 11, image 56). There are several additional subcentimeter hyperdense foci scattered throughout the brain that are rounded and cortically based for example in the right parietal lobe (series 9, image 27). No hydrocephalus, findings of stroke, mass effect, or herniation. Nonspecific white matter  hypodensities are compatible with mild to moderate chronic microvascular ischemic changes and there is stable volume loss of the brain. Vascular: Calcific atherosclerosis of carotid siphons. No hyperdense vessel identified. Skull: Motion artifact, no grossly displaced calvarial fracture. Other: None. CT MAXILLOFACIAL FINDINGS Osseous: Extensive motion artifact. No grossly displaced facial fracture identified. No mandibular dislocation. Orbits: Negative. No traumatic or inflammatory finding. Sinuses: Clear. Soft tissues: Negative. CT CERVICAL SPINE FINDINGS Alignment: C6-7, C7-T1, T1-2 grade 1 anterolisthesis is stable. Straightening of cervical lordosis. Skull base and vertebrae: No acute fracture. No primary bone lesion or focal pathologic process. Soft tissues and spinal canal: No prevertebral fluid or swelling. No visible canal hematoma. Disc levels: Advanced disc and facet degenerative changes greatest at the C4-5 level where prominent disc osteophyte complex results in at least moderate spinal canal stenosis. Multilevel facet effusion, likely on degenerative basis. Upper chest: Negative. Other: Calcific atherosclerosis of carotid bifurcations. IMPRESSION: CT head: 1. Motion degraded study.  No gross calvarial fracture. 2. Acute hemorrhages in the paramedian parietal lobes bilaterally measuring up to 2.4 cm, 4 cc with small volume of subarachnoid hemorrhage along dorsal falx. Mild local mass effect without herniation. 3. Several additional tiny acute hemorrhages scattered throughout the brain in cortical distribution are somewhat atypical for trauma and may represent microhemorrhage related to amyloid disease, hypertension, or possibly metastasis. When patient is able to hold still, consider MRI of the brain without and with contrast. 4. Stable mild to moderate chronic microvascular ischemic changes and volume loss of the brain for age. CT maxillofacial: 1. Motion artifact, no grossly displaced facial fracture  identified. No mandibular dislocation. CT cervical spine: 1. No acute fracture or dislocation identified. 2. Stable advanced cervical spondylosis greatest at C4-5 level where there is at least moderate spinal canal stenosis. Critical Value/emergent results were called by telephone at the time of interpretation on 09/02/2018 at 3:36 pm to Dr. Leonia Corona , who verbally acknowledged these results. Electronically Signed   By: Mitzi Hansen M.D.   On: 09/02/2018 15:40   Ct Cervical Spine Wo Contrast  Result Date: 09/02/2018 CLINICAL DATA:  84 y/o F; unwitnessed fall. Laceration on the forehead with bleeding control. EXAM: CT HEAD WITHOUT CONTRAST CT MAXILLOFACIAL WITHOUT CONTRAST CT CERVICAL SPINE WITHOUT CONTRAST TECHNIQUE: Multidetector CT imaging of the head, cervical spine, and maxillofacial structures were performed using the standard protocol without intravenous contrast. Multiplanar CT image reconstructions of the cervical spine and maxillofacial structures were also generated. COMPARISON:  08/18/2018 CT head. FINDINGS: CT HEAD FINDINGS Brain: Acute hemorrhage within the paramedian parietal lobes bilaterally with subarachnoid hemorrhage along the dorsal falx. Mild local mass effect without herniation. The larger left parietal focus of hemorrhage measures 2.0 x 1.6 x  2.4 cm (volume = 4 cm^3)(AP x ML x CC series 9, image 23 and series 11, image 56). There are several additional subcentimeter hyperdense foci scattered throughout the brain that are rounded and cortically based for example in the right parietal lobe (series 9, image 27). No hydrocephalus, findings of stroke, mass effect, or herniation. Nonspecific white matter hypodensities are compatible with mild to moderate chronic microvascular ischemic changes and there is stable volume loss of the brain. Vascular: Calcific atherosclerosis of carotid siphons. No hyperdense vessel identified. Skull: Motion artifact, no grossly displaced  calvarial fracture. Other: None. CT MAXILLOFACIAL FINDINGS Osseous: Extensive motion artifact. No grossly displaced facial fracture identified. No mandibular dislocation. Orbits: Negative. No traumatic or inflammatory finding. Sinuses: Clear. Soft tissues: Negative. CT CERVICAL SPINE FINDINGS Alignment: C6-7, C7-T1, T1-2 grade 1 anterolisthesis is stable. Straightening of cervical lordosis. Skull base and vertebrae: No acute fracture. No primary bone lesion or focal pathologic process. Soft tissues and spinal canal: No prevertebral fluid or swelling. No visible canal hematoma. Disc levels: Advanced disc and facet degenerative changes greatest at the C4-5 level where prominent disc osteophyte complex results in at least moderate spinal canal stenosis. Multilevel facet effusion, likely on degenerative basis. Upper chest: Negative. Other: Calcific atherosclerosis of carotid bifurcations. IMPRESSION: CT head: 1. Motion degraded study.  No gross calvarial fracture. 2. Acute hemorrhages in the paramedian parietal lobes bilaterally measuring up to 2.4 cm, 4 cc with small volume of subarachnoid hemorrhage along dorsal falx. Mild local mass effect without herniation. 3. Several additional tiny acute hemorrhages scattered throughout the brain in cortical distribution are somewhat atypical for trauma and may represent microhemorrhage related to amyloid disease, hypertension, or possibly metastasis. When patient is able to hold still, consider MRI of the brain without and with contrast. 4. Stable mild to moderate chronic microvascular ischemic changes and volume loss of the brain for age. CT maxillofacial: 1. Motion artifact, no grossly displaced facial fracture identified. No mandibular dislocation. CT cervical spine: 1. No acute fracture or dislocation identified. 2. Stable advanced cervical spondylosis greatest at C4-5 level where there is at least moderate spinal canal stenosis. Critical Value/emergent results were called  by telephone at the time of interpretation on 09/02/2018 at 3:36 pm to Dr. Leonia Corona , who verbally acknowledged these results. Electronically Signed   By: Mitzi Hansen M.D.   On: 09/02/2018 15:40   Dg Chest Portable 1 View  Result Date: 09/02/2018 CLINICAL DATA:  Fall, bleeding from the right side of the forehead, uncooperative patient EXAM: PORTABLE CHEST 1 VIEW COMPARISON:  Portable chest x-ray of 08/18/2017 FINDINGS: No focal pneumonia or pleural effusion is seen. Moderate cardiomegaly is stable. The descending thoracic aorta appears somewhat ectatic. Old fractures of the left and right proximal humeri are noted. The bones are osteopenic. IMPRESSION: 1. Stable moderate cardiomegaly.  No active lung disease. 2. Old fractures of the proximal humeri left-greater-than-right. There may be fusion of the left shoulder joint. Electronically Signed   By: Dwyane Dee M.D.   On: 09/02/2018 14:56   Ct Maxillofacial Wo Contrast  Result Date: 09/02/2018 CLINICAL DATA:  84 y/o F; unwitnessed fall. Laceration on the forehead with bleeding control. EXAM: CT HEAD WITHOUT CONTRAST CT MAXILLOFACIAL WITHOUT CONTRAST CT CERVICAL SPINE WITHOUT CONTRAST TECHNIQUE: Multidetector CT imaging of the head, cervical spine, and maxillofacial structures were performed using the standard protocol without intravenous contrast. Multiplanar CT image reconstructions of the cervical spine and maxillofacial structures were also generated. COMPARISON:  08/18/2018 CT head. FINDINGS: CT  HEAD FINDINGS Brain: Acute hemorrhage within the paramedian parietal lobes bilaterally with subarachnoid hemorrhage along the dorsal falx. Mild local mass effect without herniation. The larger left parietal focus of hemorrhage measures 2.0 x 1.6 x 2.4 cm (volume = 4 cm^3)(AP x ML x CC series 9, image 23 and series 11, image 56). There are several additional subcentimeter hyperdense foci scattered throughout the brain that are rounded and  cortically based for example in the right parietal lobe (series 9, image 27). No hydrocephalus, findings of stroke, mass effect, or herniation. Nonspecific white matter hypodensities are compatible with mild to moderate chronic microvascular ischemic changes and there is stable volume loss of the brain. Vascular: Calcific atherosclerosis of carotid siphons. No hyperdense vessel identified. Skull: Motion artifact, no grossly displaced calvarial fracture. Other: None. CT MAXILLOFACIAL FINDINGS Osseous: Extensive motion artifact. No grossly displaced facial fracture identified. No mandibular dislocation. Orbits: Negative. No traumatic or inflammatory finding. Sinuses: Clear. Soft tissues: Negative. CT CERVICAL SPINE FINDINGS Alignment: C6-7, C7-T1, T1-2 grade 1 anterolisthesis is stable. Straightening of cervical lordosis. Skull base and vertebrae: No acute fracture. No primary bone lesion or focal pathologic process. Soft tissues and spinal canal: No prevertebral fluid or swelling. No visible canal hematoma. Disc levels: Advanced disc and facet degenerative changes greatest at the C4-5 level where prominent disc osteophyte complex results in at least moderate spinal canal stenosis. Multilevel facet effusion, likely on degenerative basis. Upper chest: Negative. Other: Calcific atherosclerosis of carotid bifurcations. IMPRESSION: CT head: 1. Motion degraded study.  No gross calvarial fracture. 2. Acute hemorrhages in the paramedian parietal lobes bilaterally measuring up to 2.4 cm, 4 cc with small volume of subarachnoid hemorrhage along dorsal falx. Mild local mass effect without herniation. 3. Several additional tiny acute hemorrhages scattered throughout the brain in cortical distribution are somewhat atypical for trauma and may represent microhemorrhage related to amyloid disease, hypertension, or possibly metastasis. When patient is able to hold still, consider MRI of the brain without and with contrast. 4. Stable  mild to moderate chronic microvascular ischemic changes and volume loss of the brain for age. CT maxillofacial: 1. Motion artifact, no grossly displaced facial fracture identified. No mandibular dislocation. CT cervical spine: 1. No acute fracture or dislocation identified. 2. Stable advanced cervical spondylosis greatest at C4-5 level where there is at least moderate spinal canal stenosis. Critical Value/emergent results were called by telephone at the time of interpretation on 09/02/2018 at 3:36 pm to Dr. Leonia Corona , who verbally acknowledged these results. Electronically Signed   By: Mitzi Hansen M.D.   On: 09/02/2018 15:40    EKG: Independently reviewed.  Sinus rhythm, rate 80  Assessment/Plan  Subarachnoid hemorrhage -status post fall, in the setting of Eliquis -Seems that patient fell out of her wheelchair earlier today -CT head showed acute hemorrhage in the paramedian parietal lobes bilaterally measuring up to 2.4 cm, 4 cc with small volume of subarachnoid hemorrhage along the dorsal falx.  Mild local mass-effect without herniation.  Several additional tiny acute hemorrhage scattered throughout the brain and cortical distribution somewhat atypical for trauma and may represent microhemorrhage related to amyloid disease, hypertension or metastasis. -CT maxillofacial unremarkable for displaced facial fracture.   -CT cervical spine no acute fracture dislocation. -Patient currently DNR.  Discussed with her daughter at bedside comfort care measures and goals of care.  Daughter agrees to palliative care consult but is not ready to make this decision. -Will place on IV morphine every 4 hours as needed fior pain control -Continue  neuro checks -Will be NPO, and consult speech therapy -Given the patient is on Eliquis, patient will receive Kcentra for reversal -Neurosurgery consulted and appreciated, repeat imaging tomorrow and Keppra 500 mg twice daily x7 days for seizure  prophylaxis.  Dementia -Stable, will hold Namenda at this time  Essential hypertension -Hold Lasix and metoprolol.  If needed will place on IV hydralazine  Hyperlipidemia -Hold Vytorin  Critical aortic stenosis -Upon last admission, patient was to follow with outpatient cardiology -Given her age and fragility, she likely would not be a candidate for intervention at this point  History of DVT/PE -As above patient was on Eliquis, now will be given [place]  Goals of care -Discussed with patient's daughter at bedside, feel the patient has poor prognosis overall.  Patient currently is DNR however discussed possibly moving more towards comfort measures.  Patient's daughter does want her to be comfortable however not ready to make this decision.  We will place on IV morphine every 4 hours for pain.  Will also consult palliative care.  DVT prophylaxis: SCDs  Code Status: DNR  Family Communication: Daughter at bedside.   Disposition Plan: TBD   Consults called: Neurosurgery palliative care  Admission status: Inpatient (patient will need inpatient admission given her age and fragility and current status of subarachnoid hemorrhage.  She will need constant monitoring with both neuro checks and vital signs.  Patient will be receiving IV Kcentra for reversal of Eliquis which will require lab monitoring.)  Time spent: 70 minutes  Jaxson Anglin D.O. [facility]  Between 7am to 7pm - Please see pager noted on [email or web address]  After 7pm go to [email or web address] 09/02/2018, 5:11 PM

## 2018-09-02 NOTE — ED Notes (Signed)
Pt is super agitated, not tolerating any medical interventions, monitoring devices, unable to redirect or console; daughter at bedside

## 2018-09-02 NOTE — ED Provider Notes (Signed)
[facility] Provider Note   CSN: [id number] Arrival date & time: 09/02/18  1356     History   Chief Complaint Chief Complaint  Patient presents with  . Fall    HPI [name] is a 84 y.o. female.  HPI   She is a 84 year old female with a history of aortic valve disorder, dementia, hyperlipidemia, hypertension, who presents the emergency department today for evaluation after a fall.  [facility].  According to EMS patient reportedly fell out of her wheelchair.  The fall was unwitnessed.  Patient sustained head trauma but unclear LOC.  Has lacerations to the forehead.  Is currently on apixaban.  Level 5 caveat given patient's history of dementia.  Past Medical History:  Diagnosis Date  . Allergic rhinitis   . Aortic valve disorders   . Arthritis   . Dementia (HCC)   . Dyslipidemia   . Glaucoma   . Hypercholesteremia   . Hypertension   . Memory loss   . Obesity   . Vertigo     Patient Active Problem List   Diagnosis Date Noted  . Subarachnoid hematoma (HCC) 09/02/2018  . Pressure injury of skin 08/20/2018  . Critical aortic valve stenosis 08/20/2018  . Dementia with behavioral disturbance (HCC) 08/20/2018  . Obesity (BMI 30.0-34.9) 08/20/2018  . UTI (urinary tract infection) 08/18/2018  . Lower leg DVT (deep venous thrombosis), right 12/26/2015  . Generalized weakness 12/26/2015  . Pulmonary embolism without acute cor pulmonale (HCC) 12/26/2015  . Humerus fracture 12/18/2015  . Pulmonary embolism (HCC) 12/18/2015  . Acute encephalopathy   . Fracture of humerus, left, closed 12/17/2015  . SAH (subarachnoid hemorrhage) (HCC) 11/16/2015  . Diastolic dysfunction-grade 2 11/16/2015  . Wrist fracture 11/14/2015  . Fall 11/14/2015  . Essential hypertension   . Hypercholesteremia   . Allergic rhinitis   . Arthritis   . Dyslipidemia   . Obesity     Past Surgical History:  Procedure  Laterality Date  . APPENDECTOMY    . bilataral cataracts removal with lens replacement Dr. Darel HongBeavis    . CARDIAC CATHETERIZATION     normal coronary arteries  . SHOULDER SURGERY       OB History   None      Home Medications    Prior to Admission medications   Medication Sig Start Date End Date Taking? Authorizing Provider  apixaban (ELIQUIS) 5 MG TABS tablet Take 5 mg by mouth 2 (two) times daily.    Yes [provider]  furosemide (LASIX) 20 MG tablet Take 1 tablet (20 mg total) by mouth daily. 08/22/18  Yes Standley BrookingGoodrich, Daniel P, MD  memantine (NAMENDA) 10 MG tablet Take 1 tablet (10 mg total) by mouth 2 (two) times daily. 01/12/18  Yes Butch PennyMillikan, Megan, NP  metoprolol succinate (TOPROL-XL) 25 MG 24 hr tablet Take 1 tablet (25 mg total) by mouth daily. 07/17/15  Yes Robbie LisSimmons, Brittainy M, PA-C  Multiple Vitamin (MULTIVITAMIN WITH MINERALS) TABS tablet Take 1 tablet by mouth daily. 08/23/18  Yes Standley BrookingGoodrich, Daniel P, MD  polyethylene glycol [facility](MIRALAX / Ethelene HalGLYCOLAX) packet Take 17 g by mouth daily as needed for mild constipation.    Yes [provider]  Amino Acids-Protein Hydrolys (FEEDING SUPPLEMENT, PRO-STAT SUGAR FREE 64,) LIQD Take 30 mLs by mouth 2 (two) times daily. 08/22/18   Standley BrookingGoodrich, Daniel P, MD  ezetimibe-simvastatin (VYTORIN) 10-20 MG per tablet Take 1 tablet by mouth daily.     [provider]  hydrocerin (EUCERIN) CREA Apply 1 application topically daily. Apply Eucerin cream to bilat legs Q day after bathing and roughly towel drying to assist with removal of loose skin 08/23/18   Standley Brooking, MD  nystatin (MYCOSTATIN/NYSTOP) powder Apply topically 3 (three) times daily. Apply to affected areas for cutaneous candidiasis 08/22/18   Standley Brooking, MD  traMADol (ULTRAM) 50 MG tablet Take 1 tablet (50 mg total) by mouth 2 (two) times daily. 08/22/18   Standley Brooking, MD    Family History Family History  Problem Relation Age of Onset  . Heart disease  Sister   . Stroke Neg Hx   . CAD Neg Hx   . Cancer Neg Hx     Social History Social History   Tobacco Use  . Smoking status: Never Smoker  . Smokeless tobacco: Never Used  Substance Use Topics  . Alcohol use: No    Alcohol/week: 0.0 standard drinks  . Drug use: No     Allergies   Patient has no known allergies.   Review of Systems Review of Systems  Unable to perform ROS: Dementia  Constitutional:       Fall  Skin: Positive for wound.  Neurological:       Head trauma     Physical Exam Updated Vital Signs BP 119/77   Pulse (!) 102   Temp (!) 97.5 F (36.4 C) (Rectal)   Resp 18   SpO2 99%   Physical Exam  Constitutional:  Chronically ill-appearing female, agitated  HENT:  2 cm linear laceration to the right temple, 1 cm linear laceration just superior to the right eyebrow with no significant bleeding.  Mild tenderness to the right eyebrow.  No significant tenderness to the nose though there is dried blood to the bilateral nares.  Mucous membranes are dry.  Eyes:  Left pupil slightly larger than right.  Both are equal round and reactive to light.  Neck: Neck supple.  Cardiovascular: Normal rate, regular rhythm and intact distal pulses.  Pulmonary/Chest: Effort normal and breath sounds normal. No respiratory distress.  Abdominal: Soft.  Tenderness to the lower abdomen.  No rigidity  Musculoskeletal:  No midline cervical, thoracic or lumbar spine tenderness.  Bilateral lower extremities are edematous with venous stasis changes and erythema.  Skin is dry.  No deformity or tenderness to the bilateral upper or lower extremity's.  Neurological: She is alert.  Patient unable to follow commands.  She is moving all extremities.  She responds to voice but is unable to answer most questions appropriately.  Skin: Skin is warm. Capillary refill takes less than 2 seconds.  Psychiatric:  Agitated, anxious, crying  Nursing note and vitals reviewed.    ED Treatments /  Results  Labs (all labs ordered are listed, but only abnormal results are displayed) Labs Reviewed  CBC WITH DIFFERENTIAL/PLATELET - Abnormal; Notable for the following components:      Result Value   RDW 15.9 (*)    All other components within normal limits  BASIC METABOLIC PANEL - Abnormal; Notable for the following components:   Glucose, Bld 158 (*)    Calcium 8.8 (*)    GFR calc non Af Amer 58 (*)    All other components within normal limits  URINALYSIS, ROUTINE W REFLEX MICROSCOPIC  I-STAT TROPONIN, ED    EKG None  Radiology Ct Head Wo Contrast  Result Date: 09/02/2018 CLINICAL DATA:  84 y/o F; unwitnessed fall. Laceration on the forehead with  bleeding control. EXAM: CT HEAD WITHOUT CONTRAST CT MAXILLOFACIAL WITHOUT CONTRAST CT CERVICAL SPINE WITHOUT CONTRAST TECHNIQUE: Multidetector CT imaging of the head, cervical spine, and maxillofacial structures were performed using the standard protocol without intravenous contrast. Multiplanar CT image reconstructions of the cervical spine and maxillofacial structures were also generated. COMPARISON:  08/18/2018 CT head. FINDINGS: CT HEAD FINDINGS Brain: Acute hemorrhage within the paramedian parietal lobes bilaterally with subarachnoid hemorrhage along the dorsal falx. Mild local mass effect without herniation. The larger left parietal focus of hemorrhage measures 2.0 x 1.6 x 2.4 cm (volume = 4 cm^3)(AP x ML x CC series 9, image 23 and series 11, image 56). There are several additional subcentimeter hyperdense foci scattered throughout the brain that are rounded and cortically based for example in the right parietal lobe (series 9, image 27). No hydrocephalus, findings of stroke, mass effect, or herniation. Nonspecific white matter hypodensities are compatible with mild to moderate chronic microvascular ischemic changes and there is stable volume loss of the brain. Vascular: Calcific atherosclerosis of carotid siphons. No hyperdense vessel  identified. Skull: Motion artifact, no grossly displaced calvarial fracture. Other: None. CT MAXILLOFACIAL FINDINGS Osseous: Extensive motion artifact. No grossly displaced facial fracture identified. No mandibular dislocation. Orbits: Negative. No traumatic or inflammatory finding. Sinuses: Clear. Soft tissues: Negative. CT CERVICAL SPINE FINDINGS Alignment: C6-7, C7-T1, T1-2 grade 1 anterolisthesis is stable. Straightening of cervical lordosis. Skull base and vertebrae: No acute fracture. No primary bone lesion or focal pathologic process. Soft tissues and spinal canal: No prevertebral fluid or swelling. No visible canal hematoma. Disc levels: Advanced disc and facet degenerative changes greatest at the C4-5 level where prominent disc osteophyte complex results in at least moderate spinal canal stenosis. Multilevel facet effusion, likely on degenerative basis. Upper chest: Negative. Other: Calcific atherosclerosis of carotid bifurcations. IMPRESSION: CT head: 1. Motion degraded study.  No gross calvarial fracture. 2. Acute hemorrhages in the paramedian parietal lobes bilaterally measuring up to 2.4 cm, 4 cc with small volume of subarachnoid hemorrhage along dorsal falx. Mild local mass effect without herniation. 3. Several additional tiny acute hemorrhages scattered throughout the brain in cortical distribution are somewhat atypical for trauma and may represent microhemorrhage related to amyloid disease, hypertension, or possibly metastasis. When patient is able to hold still, consider MRI of the brain without and with contrast. 4. Stable mild to moderate chronic microvascular ischemic changes and volume loss of the brain for age. CT maxillofacial: 1. Motion artifact, no grossly displaced facial fracture identified. No mandibular dislocation. CT cervical spine: 1. No acute fracture or dislocation identified. 2. Stable advanced cervical spondylosis greatest at C4-5 level where there is at least moderate spinal  canal stenosis. Critical Value/emergent results were called by telephone at the time of interpretation on 09/02/2018 at 3:36 pm to Dr. Leonia Corona , who verbally acknowledged these results. Electronically Signed   By: Mitzi Hansen M.D.   On: 09/02/2018 15:40   Ct Cervical Spine Wo Contrast  Result Date: 09/02/2018 CLINICAL DATA:  84 y/o F; unwitnessed fall. Laceration on the forehead with bleeding control. EXAM: CT HEAD WITHOUT CONTRAST CT MAXILLOFACIAL WITHOUT CONTRAST CT CERVICAL SPINE WITHOUT CONTRAST TECHNIQUE: Multidetector CT imaging of the head, cervical spine, and maxillofacial structures were performed using the standard protocol without intravenous contrast. Multiplanar CT image reconstructions of the cervical spine and maxillofacial structures were also generated. COMPARISON:  08/18/2018 CT head. FINDINGS: CT HEAD FINDINGS Brain: Acute hemorrhage within the paramedian parietal lobes bilaterally with subarachnoid hemorrhage along the dorsal falx.  Mild local mass effect without herniation. The larger left parietal focus of hemorrhage measures 2.0 x 1.6 x 2.4 cm (volume = 4 cm^3)(AP x ML x CC series 9, image 23 and series 11, image 56). There are several additional subcentimeter hyperdense foci scattered throughout the brain that are rounded and cortically based for example in the right parietal lobe (series 9, image 27). No hydrocephalus, findings of stroke, mass effect, or herniation. Nonspecific white matter hypodensities are compatible with mild to moderate chronic microvascular ischemic changes and there is stable volume loss of the brain. Vascular: Calcific atherosclerosis of carotid siphons. No hyperdense vessel identified. Skull: Motion artifact, no grossly displaced calvarial fracture. Other: None. CT MAXILLOFACIAL FINDINGS Osseous: Extensive motion artifact. No grossly displaced facial fracture identified. No mandibular dislocation. Orbits: Negative. No traumatic or inflammatory  finding. Sinuses: Clear. Soft tissues: Negative. CT CERVICAL SPINE FINDINGS Alignment: C6-7, C7-T1, T1-2 grade 1 anterolisthesis is stable. Straightening of cervical lordosis. Skull base and vertebrae: No acute fracture. No primary bone lesion or focal pathologic process. Soft tissues and spinal canal: No prevertebral fluid or swelling. No visible canal hematoma. Disc levels: Advanced disc and facet degenerative changes greatest at the C4-5 level where prominent disc osteophyte complex results in at least moderate spinal canal stenosis. Multilevel facet effusion, likely on degenerative basis. Upper chest: Negative. Other: Calcific atherosclerosis of carotid bifurcations. IMPRESSION: CT head: 1. Motion degraded study.  No gross calvarial fracture. 2. Acute hemorrhages in the paramedian parietal lobes bilaterally measuring up to 2.4 cm, 4 cc with small volume of subarachnoid hemorrhage along dorsal falx. Mild local mass effect without herniation. 3. Several additional tiny acute hemorrhages scattered throughout the brain in cortical distribution are somewhat atypical for trauma and may represent microhemorrhage related to amyloid disease, hypertension, or possibly metastasis. When patient is able to hold still, consider MRI of the brain without and with contrast. 4. Stable mild to moderate chronic microvascular ischemic changes and volume loss of the brain for age. CT maxillofacial: 1. Motion artifact, no grossly displaced facial fracture identified. No mandibular dislocation. CT cervical spine: 1. No acute fracture or dislocation identified. 2. Stable advanced cervical spondylosis greatest at C4-5 level where there is at least moderate spinal canal stenosis. Critical Value/emergent results were called by telephone at the time of interpretation on 09/02/2018 at 3:36 pm to Dr. Leonia Corona , who verbally acknowledged these results. Electronically Signed   By: Mitzi Hansen M.D.   On: 09/02/2018 15:40    Dg Chest Portable 1 View  Result Date: 09/02/2018 CLINICAL DATA:  Fall, bleeding from the right side of the forehead, uncooperative patient EXAM: PORTABLE CHEST 1 VIEW COMPARISON:  Portable chest x-ray of 08/18/2017 FINDINGS: No focal pneumonia or pleural effusion is seen. Moderate cardiomegaly is stable. The descending thoracic aorta appears somewhat ectatic. Old fractures of the left and right proximal humeri are noted. The bones are osteopenic. IMPRESSION: 1. Stable moderate cardiomegaly.  No active lung disease. 2. Old fractures of the proximal humeri left-greater-than-right. There may be fusion of the left shoulder joint. Electronically Signed   By: Dwyane Dee M.D.   On: 09/02/2018 14:56   Ct Maxillofacial Wo Contrast  Result Date: 09/02/2018 CLINICAL DATA:  84 y/o F; unwitnessed fall. Laceration on the forehead with bleeding control. EXAM: CT HEAD WITHOUT CONTRAST CT MAXILLOFACIAL WITHOUT CONTRAST CT CERVICAL SPINE WITHOUT CONTRAST TECHNIQUE: Multidetector CT imaging of the head, cervical spine, and maxillofacial structures were performed using the standard protocol without intravenous contrast. Multiplanar CT image  reconstructions of the cervical spine and maxillofacial structures were also generated. COMPARISON:  08/18/2018 CT head. FINDINGS: CT HEAD FINDINGS Brain: Acute hemorrhage within the paramedian parietal lobes bilaterally with subarachnoid hemorrhage along the dorsal falx. Mild local mass effect without herniation. The larger left parietal focus of hemorrhage measures 2.0 x 1.6 x 2.4 cm (volume = 4 cm^3)(AP x ML x CC series 9, image 23 and series 11, image 56). There are several additional subcentimeter hyperdense foci scattered throughout the brain that are rounded and cortically based for example in the right parietal lobe (series 9, image 27). No hydrocephalus, findings of stroke, mass effect, or herniation. Nonspecific white matter hypodensities are compatible with mild to moderate  chronic microvascular ischemic changes and there is stable volume loss of the brain. Vascular: Calcific atherosclerosis of carotid siphons. No hyperdense vessel identified. Skull: Motion artifact, no grossly displaced calvarial fracture. Other: None. CT MAXILLOFACIAL FINDINGS Osseous: Extensive motion artifact. No grossly displaced facial fracture identified. No mandibular dislocation. Orbits: Negative. No traumatic or inflammatory finding. Sinuses: Clear. Soft tissues: Negative. CT CERVICAL SPINE FINDINGS Alignment: C6-7, C7-T1, T1-2 grade 1 anterolisthesis is stable. Straightening of cervical lordosis. Skull base and vertebrae: No acute fracture. No primary bone lesion or focal pathologic process. Soft tissues and spinal canal: No prevertebral fluid or swelling. No visible canal hematoma. Disc levels: Advanced disc and facet degenerative changes greatest at the C4-5 level where prominent disc osteophyte complex results in at least moderate spinal canal stenosis. Multilevel facet effusion, likely on degenerative basis. Upper chest: Negative. Other: Calcific atherosclerosis of carotid bifurcations. IMPRESSION: CT head: 1. Motion degraded study.  No gross calvarial fracture. 2. Acute hemorrhages in the paramedian parietal lobes bilaterally measuring up to 2.4 cm, 4 cc with small volume of subarachnoid hemorrhage along dorsal falx. Mild local mass effect without herniation. 3. Several additional tiny acute hemorrhages scattered throughout the brain in cortical distribution are somewhat atypical for trauma and may represent microhemorrhage related to amyloid disease, hypertension, or possibly metastasis. When patient is able to hold still, consider MRI of the brain without and with contrast. 4. Stable mild to moderate chronic microvascular ischemic changes and volume loss of the brain for age. CT maxillofacial: 1. Motion artifact, no grossly displaced facial fracture identified. No mandibular dislocation. CT cervical  spine: 1. No acute fracture or dislocation identified. 2. Stable advanced cervical spondylosis greatest at C4-5 level where there is at least moderate spinal canal stenosis. Critical Value/emergent results were called by telephone at the time of interpretation on 09/02/2018 at 3:36 pm to Dr. Leonia Corona , who verbally acknowledged these results. Electronically Signed   By: Mitzi Hansen M.D.   On: 09/02/2018 15:40    Procedures Procedures (including critical care time)  CRITICAL CARE Performed by: Karrie Meres   Total critical care time: 38 minutes  Critical care time was exclusive of separately billable procedures and treating other patients.  Critical care was necessary to treat or prevent imminent or life-threatening deterioration.  Critical care was time spent personally by me on the following activities: development of treatment plan with patient and/or surrogate as well as nursing, discussions with consultants, evaluation of patient's response to treatment, examination of patient, obtaining history from patient or surrogate, ordering and performing treatments and interventions, ordering and review of laboratory studies, ordering and review of radiographic studies, pulse oximetry and re-evaluation of patient's condition.   Medications Ordered in ED Medications  fentaNYL (SUBLIMAZE) injection 25 mcg (has no administration in time range)  prothrombin complex conc human (KCENTRA) IVPB 4,352 Units (has no administration in time range)  fentaNYL (SUBLIMAZE) injection 25 mcg (25 mcg Intramuscular Given 09/02/18 1451)  LORazepam (ATIVAN) injection 0.5 mg (0.5 mg Intravenous Given 09/02/18 1535)     Initial Impression / Assessment and Plan / ED Course  I have reviewed the triage vital signs and the nursing notes.  Pertinent labs & imaging results that were available during my care of the patient were reviewed by me and considered in my medical decision making (see chart  for details).  Clinical Course as of Sep 02 1658  Thu Sep 02, 2018  1633 Case discussed with Courtni Baden Betsch, PA-C at shift change. Patient is going to be admitted, but needs 2 head lacerations repaired. This EDP will perform lac repair.   [GM]    Clinical Course User Index [GM] Windy Carina, [place]    Final Clinical Impressions(s) / ED Diagnoses   Final diagnoses:  Fall, initial encounter  Subarachnoid hemorrhage (HCC)   84 year old female with a history of dementia presenting after unwitnessed fall of her wheelchair prior to arrival.  Head trauma confirmed but unsure of LOC.  Has lacerations to the right forehead and just superior to the right eye.  Patient is alert but is unable to follow simple commands or answer questions appropriately.  She has no midline tenderness.  No obvious bony tenderness to the bilateral upper and lower extremities.  Chest x-ray with stable cardiomegaly megaly and old fractures to the bilateral humeri.  Pelvis x-ray still pending after time of admission.  CT maxillofacial without bony abnormality.  CT cervical spine is degraded secondary to motion however there is no evidence of acute fracture.  CT head with acute hemorrhages in the paramedian parietal lobes with small volume subarachnoid hemorrhage along the dorsal falx.  There is local mass-effect without herniation.  There also several additional small acute hemorrhages scattered throughout the brain that seem atypical for trauma, could represent microhemorrhage or possibly metastasis.  MRI at a further date recommended for follow-up.  Based on findings of the CT scan showing bleeding in the brain, have goals of care discussion with the patient's daughter at bedside with Dr. Lockie Mola.  Patient's daughter states that she is unable to make a decision about transitioning patient to comfort care at this time and would like to proceed with reversal of her anticoagulation.  Discussed reversal options with  pharmacy who recommended [facility] for this patient.  Neurosurgery was consulted, [place] PA-C will see the patient at bedside and provide further recommendations.  He recommended admission to the hospital in the meantime.  Dr. Lockie Mola spoke with hospitalist service in regards to the patient.  Patient was accepted for admission.  Discussed case with Dagoberto Ligas, PA-C who will repair the wounds on the patient's face.  ED Discharge Orders    None       Karrie Meres, PA-C 09/02/18 1700    Virgina Norfolk, DO 09/02/18 1824

## 2018-09-02 NOTE — ED Notes (Signed)
neurosurg PA at bedside 

## 2018-09-02 NOTE — Consult Note (Signed)
Chief Complaint   Chief Complaint  Patient presents with  . Fall    HPI   Consult requested by: Ronnald Nian Reason for consult: ICH  HPI: [name] is a 84 y.o. female who presents to ER from SNF after a fall in the cafeteria. Unclear events surrounding the fall. Patient has dementia and is unable to give history. Daughter at bedside assisting. Patient was recently hospitalized from 10/30-11/5 after a fall at home, found to have a UTI and cellulitis. She was discharged to SNF for rehab. Patient was previously living with daughter. At baseline was ambulatory although required significant assistance with getting dressed, showering, etc bu-t able to hold a conversation. Is much more altered afternoon/evening than in am. Is on Eliquis for DVT/PE.   Patient Active Problem List   Diagnosis Date Noted  . Subarachnoid hematoma ([place]) 09/02/2018  . Pressure injury of skin 08/20/2018  . Critical aortic valve stenosis 08/20/2018  . Dementia with behavioral disturbance ([place]) 08/20/2018  . Obesity (BMI 30.0-34.9) 08/20/2018  . UTI (urinary tract infection) 08/18/2018  . Lower leg DVT (deep venous thrombosis), right 12/26/2015  . Generalized weakness 12/26/2015  . Pulmonary embolism without acute cor pulmonale ([place]) 12/26/2015  . Humerus fracture 12/18/2015  . Pulmonary embolism ([place]) 12/18/2015  . Acute encephalopathy   . Fracture of humerus, left, closed 12/17/2015  . SAH (subarachnoid hemorrhage) ([place]) 11/16/2015  . Diastolic dysfunction-grade 2 11/16/2015  . Wrist fracture 11/14/2015  . Fall 11/14/2015  . Essential hypertension   . Hypercholesteremia   . Allergic rhinitis   . Arthritis   . Dyslipidemia   . Obesity     PMH: Past Medical History:  Diagnosis Date  . Allergic rhinitis   . Aortic valve disorders   . Arthritis   . Dementia ([place])   . Dyslipidemia   . Glaucoma   . Hypercholesteremia   . Hypertension   . Memory loss   . Obesity   . Vertigo     PSH: Past  Surgical History:  Procedure Laterality Date  . APPENDECTOMY    . bilataral cataracts removal with lens replacement Dr. Tommy Rainwater    . CARDIAC CATHETERIZATION     normal coronary arteries  . SHOULDER SURGERY       (Not in a hospital admission)  SH: Social History   Tobacco Use  . Smoking status: Never Smoker  . Smokeless tobacco: Never Used  Substance Use Topics  . Alcohol use: No    Alcohol/week: 0.0 standard drinks  . Drug use: No    MEDS: Prior to Admission medications   Medication Sig Start Date End Date Taking? Authorizing Provider  apixaban (ELIQUIS) 5 MG TABS tablet Take 5 mg by mouth 2 (two) times daily.    Yes [provider]  furosemide (LASIX) 20 MG tablet Take 1 tablet (20 mg total) by mouth daily. 08/22/18  Yes Samuella Cota, MD  memantine (NAMENDA) 10 MG tablet Take 1 tablet (10 mg total) by mouth 2 (two) times daily. 01/12/18  Yes Ward Givens, NP  metoprolol succinate (TOPROL-XL) 25 MG 24 hr tablet Take 1 tablet (25 mg total) by mouth daily. 07/17/15  Yes Lyda Jester M, PA-C  Multiple Vitamin (MULTIVITAMIN WITH MINERALS) TABS tablet Take 1 tablet by mouth daily. 08/23/18  Yes Samuella Cota, MD  polyethylene glycol [facility] Floria Raveling) packet Take 17 g by mouth daily as needed for mild constipation.    Yes [provider]  Amino Acids-Protein Hydrolys (FEEDING SUPPLEMENT,  PRO-STAT SUGAR FREE 64,) LIQD Take 30 mLs by mouth 2 (two) times daily. 08/22/18   Samuella Cota, MD  ezetimibe-simvastatin (VYTORIN) 10-20 MG per tablet Take 1 tablet by mouth daily.     [provider]  hydrocerin (EUCERIN) CREA Apply 1 application topically daily. Apply Eucerin cream to bilat legs Q day after bathing and roughly towel drying to assist with removal of loose skin 08/23/18   Samuella Cota, MD  nystatin (MYCOSTATIN/NYSTOP) powder Apply topically 3 (three) times daily. Apply to affected areas for cutaneous candidiasis 08/22/18    Samuella Cota, MD  traMADol (ULTRAM) 50 MG tablet Take 1 tablet (50 mg total) by mouth 2 (two) times daily. 08/22/18   Samuella Cota, MD    ALLERGY: No Known Allergies  Social History   Tobacco Use  . Smoking status: Never Smoker  . Smokeless tobacco: Never Used  Substance Use Topics  . Alcohol use: No    Alcohol/week: 0.0 standard drinks     Family History  Problem Relation Age of Onset  . Heart disease Sister   . Stroke Neg Hx   . CAD Neg Hx   . Cancer Neg Hx      ROS   ROS unable to perform, AMS  Exam   Vitals:   09/02/18 1535 09/02/18 1600  BP:  113/64  Pulse: (!) 102   Resp: 19 18  Temp:    SpO2: 99%    Elderly female, laying on right side Asking for leads to be removed. Calling for daughter and mother Does not follow commands but does move all extremities spontaneously  Pupils equal round and reactive Unable to perform strength testing Unable to obtain thorough CN exam, but grossly intact  Results - Imaging/Labs   Results for orders placed or performed during the hospital encounter of 09/02/18 (from the past 48 hour(s))  CBC with Differential     Status: Abnormal   Collection Time: 09/02/18  2:31 PM  Result Value Ref Range   WBC 9.6 4.0 - 10.5 K/uL   RBC 4.51 3.87 - 5.11 MIL/uL   Hemoglobin 12.7 12.0 - 15.0 g/dL   HCT 42.4 36.0 - 46.0 %   MCV 94.0 80.0 - 100.0 fL   MCH 28.2 26.0 - 34.0 pg   MCHC 30.0 30.0 - 36.0 g/dL   RDW 15.9 (H) 11.5 - 15.5 %   Platelets 281 150 - 400 K/uL   nRBC 0.0 0.0 - 0.2 %   Neutrophils Relative % 57 %   Neutro Abs 5.6 1.7 - 7.7 K/uL   Lymphocytes Relative 32 %   Lymphs Abs 3.1 0.7 - 4.0 K/uL   Monocytes Relative 7 %   Monocytes Absolute 0.6 0.1 - 1.0 K/uL   Eosinophils Relative 2 %   Eosinophils Absolute 0.2 0.0 - 0.5 K/uL   Basophils Relative 1 %   Basophils Absolute 0.1 0.0 - 0.1 K/uL   Immature Granulocytes 1 %   Abs Immature Granulocytes 0.05 0.00 - 0.07 K/uL    Comment: Performed at [facility], [address] [place], [place] [address]  Basic metabolic panel     Status: Abnormal   Collection Time: 09/02/18  2:31 PM  Result Value Ref Range   Sodium 139 135 - 145 mmol/L   Potassium 4.0 3.5 - 5.1 mmol/L   Chloride 103 98 - 111 mmol/L   CO2 27 22 - 32 mmol/L   Glucose, Bld 158 (H) 70 - 99  mg/dL   BUN 22 8 - 23 mg/dL   Creatinine, Ser 0.85 0.44 - 1.00 mg/dL   Calcium 8.8 (L) 8.9 - 10.3 mg/dL   GFR calc non Af Amer 58 (L) >60 mL/min   GFR calc Af Amer >60 >60 mL/min    Comment: (NOTE) The eGFR has been calculated using the CKD EPI equation. This calculation has not been validated in all clinical situations. eGFR's persistently <60 mL/min signify possible Chronic Kidney Disease.    Anion gap 9 5 - 15    Comment: Performed at [facility] [address] [place], [place] [address]  I-Stat Troponin, ED (not at [facility])     Status: None   Collection Time: 09/02/18  2:44 PM  Result Value Ref Range   Troponin i, poc 0.01 0.00 - 0.08 ng/mL   Comment 3            Comment: Due to the release kinetics of cTnI, a negative result within the first hours of the onset of symptoms does not rule out myocardial infarction with certainty. If myocardial infarction is still suspected, repeat the test at appropriate intervals.     Ct Head Wo Contrast  Result Date: 09/02/2018 CLINICAL DATA:  84 y/o F; unwitnessed fall. Laceration on the forehead with bleeding control. EXAM: CT HEAD WITHOUT CONTRAST CT MAXILLOFACIAL WITHOUT CONTRAST CT CERVICAL SPINE WITHOUT CONTRAST TECHNIQUE: Multidetector CT imaging of the head, cervical spine, and maxillofacial structures were performed using the standard protocol without intravenous contrast. Multiplanar CT image reconstructions of the cervical spine and maxillofacial structures were also generated. COMPARISON:  08/18/2018 CT head. FINDINGS: CT HEAD FINDINGS Brain: Acute hemorrhage within the paramedian parietal lobes bilaterally with  subarachnoid hemorrhage along the dorsal falx. Mild local mass effect without herniation. The larger left parietal focus of hemorrhage measures 2.0 x 1.6 x 2.4 cm (volume = 4 cm^3)(AP x ML x CC series 9, image 23 and series 11, image 56). There are several additional subcentimeter hyperdense foci scattered throughout the brain that are rounded and cortically based for example in the right parietal lobe (series 9, image 27). No hydrocephalus, findings of stroke, mass effect, or herniation. Nonspecific white matter hypodensities are compatible with mild to moderate chronic microvascular ischemic changes and there is stable volume loss of the brain. Vascular: Calcific atherosclerosis of carotid siphons. No hyperdense vessel identified. Skull: Motion artifact, no grossly displaced calvarial fracture. Other: None. CT MAXILLOFACIAL FINDINGS Osseous: Extensive motion artifact. No grossly displaced facial fracture identified. No mandibular dislocation. Orbits: Negative. No traumatic or inflammatory finding. Sinuses: Clear. Soft tissues: Negative. CT CERVICAL SPINE FINDINGS Alignment: C6-7, C7-T1, T1-2 grade 1 anterolisthesis is stable. Straightening of cervical lordosis. Skull base and vertebrae: No acute fracture. No primary bone lesion or focal pathologic process. Soft tissues and spinal canal: No prevertebral fluid or swelling. No visible canal hematoma. Disc levels: Advanced disc and facet degenerative changes greatest at the C4-5 level where prominent disc osteophyte complex results in at least moderate spinal canal stenosis. Multilevel facet effusion, likely on degenerative basis. Upper chest: Negative. Other: Calcific atherosclerosis of carotid bifurcations. IMPRESSION: CT head: 1. Motion degraded study.  No gross calvarial fracture. 2. Acute hemorrhages in the paramedian parietal lobes bilaterally measuring up to 2.4 cm, 4 cc with small volume of subarachnoid hemorrhage along dorsal falx. Mild local mass effect  without herniation. 3. Several additional tiny acute hemorrhages scattered throughout the brain in cortical distribution are somewhat atypical for trauma and may represent microhemorrhage related  to amyloid disease, hypertension, or possibly metastasis. When patient is able to hold still, consider MRI of the brain without and with contrast. 4. Stable mild to moderate chronic microvascular ischemic changes and volume loss of the brain for age. CT maxillofacial: 1. Motion artifact, no grossly displaced facial fracture identified. No mandibular dislocation. CT cervical spine: 1. No acute fracture or dislocation identified. 2. Stable advanced cervical spondylosis greatest at C4-5 level where there is at least moderate spinal canal stenosis. Critical Value/emergent results were called by telephone at the time of interpretation on 09/02/2018 at 3:36 pm to Dr. Coral Ceo , who verbally acknowledged these results. Electronically Signed   By: Kristine Garbe M.D.   On: 09/02/2018 15:40   Ct Cervical Spine Wo Contrast  Result Date: 09/02/2018 CLINICAL DATA:  84 y/o F; unwitnessed fall. Laceration on the forehead with bleeding control. EXAM: CT HEAD WITHOUT CONTRAST CT MAXILLOFACIAL WITHOUT CONTRAST CT CERVICAL SPINE WITHOUT CONTRAST TECHNIQUE: Multidetector CT imaging of the head, cervical spine, and maxillofacial structures were performed using the standard protocol without intravenous contrast. Multiplanar CT image reconstructions of the cervical spine and maxillofacial structures were also generated. COMPARISON:  08/18/2018 CT head. FINDINGS: CT HEAD FINDINGS Brain: Acute hemorrhage within the paramedian parietal lobes bilaterally with subarachnoid hemorrhage along the dorsal falx. Mild local mass effect without herniation. The larger left parietal focus of hemorrhage measures 2.0 x 1.6 x 2.4 cm (volume = 4 cm^3)(AP x ML x CC series 9, image 23 and series 11, image 56). There are several additional  subcentimeter hyperdense foci scattered throughout the brain that are rounded and cortically based for example in the right parietal lobe (series 9, image 27). No hydrocephalus, findings of stroke, mass effect, or herniation. Nonspecific white matter hypodensities are compatible with mild to moderate chronic microvascular ischemic changes and there is stable volume loss of the brain. Vascular: Calcific atherosclerosis of carotid siphons. No hyperdense vessel identified. Skull: Motion artifact, no grossly displaced calvarial fracture. Other: None. CT MAXILLOFACIAL FINDINGS Osseous: Extensive motion artifact. No grossly displaced facial fracture identified. No mandibular dislocation. Orbits: Negative. No traumatic or inflammatory finding. Sinuses: Clear. Soft tissues: Negative. CT CERVICAL SPINE FINDINGS Alignment: C6-7, C7-T1, T1-2 grade 1 anterolisthesis is stable. Straightening of cervical lordosis. Skull base and vertebrae: No acute fracture. No primary bone lesion or focal pathologic process. Soft tissues and spinal canal: No prevertebral fluid or swelling. No visible canal hematoma. Disc levels: Advanced disc and facet degenerative changes greatest at the C4-5 level where prominent disc osteophyte complex results in at least moderate spinal canal stenosis. Multilevel facet effusion, likely on degenerative basis. Upper chest: Negative. Other: Calcific atherosclerosis of carotid bifurcations. IMPRESSION: CT head: 1. Motion degraded study.  No gross calvarial fracture. 2. Acute hemorrhages in the paramedian parietal lobes bilaterally measuring up to 2.4 cm, 4 cc with small volume of subarachnoid hemorrhage along dorsal falx. Mild local mass effect without herniation. 3. Several additional tiny acute hemorrhages scattered throughout the brain in cortical distribution are somewhat atypical for trauma and may represent microhemorrhage related to amyloid disease, hypertension, or possibly metastasis. When patient is  able to hold still, consider MRI of the brain without and with contrast. 4. Stable mild to moderate chronic microvascular ischemic changes and volume loss of the brain for age. CT maxillofacial: 1. Motion artifact, no grossly displaced facial fracture identified. No mandibular dislocation. CT cervical spine: 1. No acute fracture or dislocation identified. 2. Stable advanced cervical spondylosis greatest at C4-5 level where there is  at least moderate spinal canal stenosis. Critical Value/emergent results were called by telephone at the time of interpretation on 09/02/2018 at 3:36 pm to Dr. Coral Ceo , who verbally acknowledged these results. Electronically Signed   By: Kristine Garbe M.D.   On: 09/02/2018 15:40   Dg Chest Portable 1 View  Result Date: 09/02/2018 CLINICAL DATA:  Fall, bleeding from the right side of the forehead, uncooperative patient EXAM: PORTABLE CHEST 1 VIEW COMPARISON:  Portable chest x-ray of 08/18/2017 FINDINGS: No focal pneumonia or pleural effusion is seen. Moderate cardiomegaly is stable. The descending thoracic aorta appears somewhat ectatic. Old fractures of the left and right proximal humeri are noted. The bones are osteopenic. IMPRESSION: 1. Stable moderate cardiomegaly.  No active lung disease. 2. Old fractures of the proximal humeri left-greater-than-right. There may be fusion of the left shoulder joint. Electronically Signed   By: Ivar Drape M.D.   On: 09/02/2018 14:56   Ct Maxillofacial Wo Contrast  Result Date: 09/02/2018 CLINICAL DATA:  84 y/o F; unwitnessed fall. Laceration on the forehead with bleeding control. EXAM: CT HEAD WITHOUT CONTRAST CT MAXILLOFACIAL WITHOUT CONTRAST CT CERVICAL SPINE WITHOUT CONTRAST TECHNIQUE: Multidetector CT imaging of the head, cervical spine, and maxillofacial structures were performed using the standard protocol without intravenous contrast. Multiplanar CT image reconstructions of the cervical spine and maxillofacial  structures were also generated. COMPARISON:  08/18/2018 CT head. FINDINGS: CT HEAD FINDINGS Brain: Acute hemorrhage within the paramedian parietal lobes bilaterally with subarachnoid hemorrhage along the dorsal falx. Mild local mass effect without herniation. The larger left parietal focus of hemorrhage measures 2.0 x 1.6 x 2.4 cm (volume = 4 cm^3)(AP x ML x CC series 9, image 23 and series 11, image 56). There are several additional subcentimeter hyperdense foci scattered throughout the brain that are rounded and cortically based for example in the right parietal lobe (series 9, image 27). No hydrocephalus, findings of stroke, mass effect, or herniation. Nonspecific white matter hypodensities are compatible with mild to moderate chronic microvascular ischemic changes and there is stable volume loss of the brain. Vascular: Calcific atherosclerosis of carotid siphons. No hyperdense vessel identified. Skull: Motion artifact, no grossly displaced calvarial fracture. Other: None. CT MAXILLOFACIAL FINDINGS Osseous: Extensive motion artifact. No grossly displaced facial fracture identified. No mandibular dislocation. Orbits: Negative. No traumatic or inflammatory finding. Sinuses: Clear. Soft tissues: Negative. CT CERVICAL SPINE FINDINGS Alignment: C6-7, C7-T1, T1-2 grade 1 anterolisthesis is stable. Straightening of cervical lordosis. Skull base and vertebrae: No acute fracture. No primary bone lesion or focal pathologic process. Soft tissues and spinal canal: No prevertebral fluid or swelling. No visible canal hematoma. Disc levels: Advanced disc and facet degenerative changes greatest at the C4-5 level where prominent disc osteophyte complex results in at least moderate spinal canal stenosis. Multilevel facet effusion, likely on degenerative basis. Upper chest: Negative. Other: Calcific atherosclerosis of carotid bifurcations. IMPRESSION: CT head: 1. Motion degraded study.  No gross calvarial fracture. 2. Acute  hemorrhages in the paramedian parietal lobes bilaterally measuring up to 2.4 cm, 4 cc with small volume of subarachnoid hemorrhage along dorsal falx. Mild local mass effect without herniation. 3. Several additional tiny acute hemorrhages scattered throughout the brain in cortical distribution are somewhat atypical for trauma and may represent microhemorrhage related to amyloid disease, hypertension, or possibly metastasis. When patient is able to hold still, consider MRI of the brain without and with contrast. 4. Stable mild to moderate chronic microvascular ischemic changes and volume loss of the brain for age.  CT maxillofacial: 1. Motion artifact, no grossly displaced facial fracture identified. No mandibular dislocation. CT cervical spine: 1. No acute fracture or dislocation identified. 2. Stable advanced cervical spondylosis greatest at C4-5 level where there is at least moderate spinal canal stenosis. Critical Value/emergent results were called by telephone at the time of interpretation on 09/02/2018 at 3:36 pm to Dr. Coral Ceo , who verbally acknowledged these results. Electronically Signed   By: Kristine Garbe M.D.   On: 09/02/2018 15:40    Impression/Plan   84 y.o. female with bilateral parietal lobe hemorrhages with small amount of SAH along dorsal falx. Unable to obtain thorough neuro exam due to altered mental status. Patient does move all extremities spontaneously. She is on Eliquis. Kcentra protocol ordered.   I had a long discussion with the patients daughter at bedside regarding the head CT findings. While there is no indication for acute NS intervention at present, there is a chance given her Eliquis use, that this [place] will worsen and require a craniotomy. Given her age and comorbidities, the daughter has decided that should this ICH worsen we will transition over to comfort care and not pursue any NS intervention. I believe this is the right decision.  Patient will be  admitted for monitoring by TH. - Keppra 589m BID x7 days for seizure prophylaxis - Repeat head CT tomorrow am for monitoring

## 2018-09-02 NOTE — ED Notes (Signed)
Attempted report x 2 

## 2018-09-03 ENCOUNTER — Inpatient Hospital Stay (HOSPITAL_COMMUNITY): Payer: Medicare Other

## 2018-09-03 DIAGNOSIS — Z7189 Other specified counseling: Secondary | ICD-10-CM

## 2018-09-03 DIAGNOSIS — Z515 Encounter for palliative care: Secondary | ICD-10-CM

## 2018-09-03 DIAGNOSIS — W19XXXA Unspecified fall, initial encounter: Secondary | ICD-10-CM

## 2018-09-03 DIAGNOSIS — S066X0A Traumatic subarachnoid hemorrhage without loss of consciousness, initial encounter: Secondary | ICD-10-CM

## 2018-09-03 LAB — RAPID URINE DRUG SCREEN, HOSP PERFORMED
AMPHETAMINES: NOT DETECTED
Barbiturates: NOT DETECTED
Benzodiazepines: POSITIVE — AB
Cocaine: NOT DETECTED
Opiates: POSITIVE — AB
Tetrahydrocannabinol: NOT DETECTED

## 2018-09-03 LAB — CBC
HCT: 35.2 % — ABNORMAL LOW (ref 36.0–46.0)
Hemoglobin: 10.6 g/dL — ABNORMAL LOW (ref 12.0–15.0)
MCH: 27.6 pg (ref 26.0–34.0)
MCHC: 30.1 g/dL (ref 30.0–36.0)
MCV: 91.7 fL (ref 80.0–100.0)
PLATELETS: 248 10*3/uL (ref 150–400)
RBC: 3.84 MIL/uL — ABNORMAL LOW (ref 3.87–5.11)
RDW: 15.9 % — ABNORMAL HIGH (ref 11.5–15.5)
WBC: 9.4 10*3/uL (ref 4.0–10.5)
nRBC: 0 % (ref 0.0–0.2)

## 2018-09-03 LAB — URINALYSIS, ROUTINE W REFLEX MICROSCOPIC
Bilirubin Urine: NEGATIVE
GLUCOSE, UA: NEGATIVE mg/dL
Hgb urine dipstick: NEGATIVE
Ketones, ur: 5 mg/dL — AB
LEUKOCYTES UA: NEGATIVE
NITRITE: NEGATIVE
PH: 7 (ref 5.0–8.0)
Protein, ur: NEGATIVE mg/dL
Specific Gravity, Urine: 1.017 (ref 1.005–1.030)

## 2018-09-03 LAB — BASIC METABOLIC PANEL
Anion gap: 10 (ref 5–15)
BUN: 15 mg/dL (ref 8–23)
CALCIUM: 8.3 mg/dL — AB (ref 8.9–10.3)
CO2: 24 mmol/L (ref 22–32)
Chloride: 106 mmol/L (ref 98–111)
Creatinine, Ser: 0.73 mg/dL (ref 0.44–1.00)
GFR calc Af Amer: >60 mL/min (ref >60)
GLUCOSE: 99 mg/dL (ref 70–99)
Potassium: 3.9 mmol/L (ref 3.5–5.1)
Sodium: 140 mmol/L (ref 135–145)

## 2018-09-03 LAB — MRSA PCR SCREENING: MRSA by PCR: POSITIVE — AB

## 2018-09-03 MED ORDER — MUPIROCIN 2 % EX OINT
1.0000 "application " | TOPICAL_OINTMENT | Freq: Two times a day (BID) | CUTANEOUS | Status: AC
  Administered 2018-09-03 – 2018-09-06 (×9): 1 via NASAL
  Filled 2018-09-03: qty 22

## 2018-09-03 MED ORDER — CHLORHEXIDINE GLUCONATE CLOTH 2 % EX PADS
6.0000 | MEDICATED_PAD | Freq: Every day | CUTANEOUS | Status: AC
  Administered 2018-09-03 – 2018-09-07 (×5): 6 via TOPICAL

## 2018-09-03 MED ORDER — OLANZAPINE 10 MG IM SOLR: 2.5000 mg | Freq: Once | INTRAMUSCULAR | Status: DC

## 2018-09-03 MED ORDER — OLANZAPINE 5 MG PO TBDP
5.0000 mg | ORAL_TABLET | Freq: Two times a day (BID) | ORAL | Status: DC | PRN
  Administered 2018-09-06: 5 mg via ORAL
  Filled 2018-09-03 (×3): qty 1

## 2018-09-03 MED ORDER — HALOPERIDOL LACTATE 5 MG/ML IJ SOLN
2.0000 mg | Freq: Once | INTRAMUSCULAR | Status: AC
  Administered 2018-09-03: 2 mg via INTRAVENOUS
  Filled 2018-09-03: qty 1

## 2018-09-03 MED ORDER — OLANZAPINE 5 MG PO TBDP
5.0000 mg | ORAL_TABLET | Freq: Once | ORAL | Status: AC
  Administered 2018-09-03: 5 mg via ORAL
  Filled 2018-09-03: qty 1

## 2018-09-03 NOTE — Progress Notes (Signed)
Patient has been occasionally screaming "help me" throughout the night, at times she stated her head hurt, and at times said she can't help it. Have been giving her morphine 2mg  iv every 4hours as ordered.

## 2018-09-03 NOTE — Consult Note (Addendum)
Consultation Note Date: 09/03/2018   Patient Name: [name]  DOB: [birth year]  MRN: [id number]  Age / Sex: 84 y.o., female  PCP: Merri Brunette, MD Referring Physician: Edsel Petrin, DO  Reason for Consultation: Establishing goals of care  HPI/Patient Profile: [name] is a 84 y.o. female with a medical history of dementia, hypertension, hyperlipidemia, history of DVT/PE on Eliquis, presented to the emergency department after falling at [facility] house.  Patient is unable to provide history, some obtained from daughter, some obtained from EDP.The patient was brought in by EMS.  It seems that she had an unwitnessed fall out of her wheelchair. She is noted to have a subarachnoid hemorrhage with other small areas of bleeding.  Clinical Assessment and Goals of Care: Patient is resting in bed. She opens here eyes very briefly and closes them. She says several times " I don't want her..." with further unclear conversation.   Spoke with her daughter [name] over the phone. She states her mother lived with her for 3 years. She had a fall and was sent to the SNF. She is widowed as of 2003. There are 2 children [name] and [name].   She states her mother has dementia, and can sometimes remember previous conversations. Over the past month, she has had difficulty feeding herself due to arthritis. She has had incontinence that has been worse over the past month.  She stopped driving 3 years ago.   She states her mother has a living will, and has said " I wish I could go home and be with the Lord. It's not that I don't love you, but I'm ready to go home." She states her mother has been seeing people that are not there, and seeing people on the porch with a suitcase. She has been seeing deceased family members.   While here in the hospital, she has been calling out to deceased loved ones.   [name] states she  believes she is the POA, but would like for me to speak to [place]. She states that [name] is an anxious person with bipolar. She does not drive.   Called [name] on conference with [name]. [name] becomes extremely angry with [name] and they argue about family issues. Attempted to return to the issue of [facility] care. She is upset that I am not available tomorrow when I am today, although a provider is available tomorrow to meet with them, and she is aware. She is upset that [name] asked me to call over the phone to speak to her on a Friday afternoon, and that she has plans for the weekend that will need to be changed. She states she has not seen her mother in 7 months, and that she has "clinical depression".   She continues to argue heavily with her sister. She stated several times "I knew this would happen. I knew she was getting worse". Both sisters state they feel their mother would not want to continue care if she is suffering, but [name] then repeats she is  not discussing her mother's death over the phone. She became verbally abusive to me and the conversation was ended.   Dr. Catha GosselinMikhail and Dr. Phillips OdorGolding made aware.    SUMMARY OF RECOMMENDATIONS   Will sign off given verbal abuse.     Code Status/Advance Care Planning:  DNR    Symptom Management:   Morphine for pain.   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  Dementia   Discharge Planning: To Be Determined      Primary Diagnoses: Present on Admission: . Subarachnoid hematoma (HCC) . Dyslipidemia . Essential hypertension . Fall . Deep vein thrombosis (DVT) of distal vein of right lower extremity (HCC) . Pulmonary embolism (HCC)   I have reviewed the medical  record, interviewed the patient and family, and examined the patient. The following aspects are pertinent.  Past Medical History:  Diagnosis Date  . Allergic rhinitis   . Anxiety   . Aortic valve disorders   . Arthritis    "all over" (09/02/2018)  . Chronic bronchitis (HCC)   . Dementia (HCC)    "early stages" (09/02/2018)  . Dyslipidemia   . Fall at nursing home 09/02/2018  . Frequent UTI   . Glaucoma   . Heart murmur   . Hypercholesteremia   . Hypertension   . Memory loss   . Obesity   . Vertigo    Social History   Socioeconomic History  . Marital status: Widowed    Spouse name: Not on file  . Number of children: 2  . Years of education: 2512  . Highest education level: Not on file  Occupational History  . Occupation: Worked for [employer]  Social Needs  . Financial resource strain: Not on file  . Food insecurity:    Worry: Not on file    Inability: Not on file  . Transportation needs:    Medical: Not on file    Non-medical: Not on file  Tobacco Use  . Smoking status: Never Smoker  . Smokeless tobacco: Never Used  Substance and Sexual Activity  . Alcohol use: Never    Alcohol/week: 0.0 standard drinks    Frequency: Never  . Drug use: Never  . Sexual activity: Not Currently  Lifestyle  . Physical activity:    Days per week: Not on file    Minutes per session: Not on file  . Stress: Not on file  Relationships  . Social connections:    Talks on phone: Not on file    Gets together: Not on file    Attends religious service: Not on file    Active member of club or organization: Not on file    Attends meetings of clubs or organizations: Not on file    Relationship status: Not on file  Other Topics Concern  . Not on file  Social History Narrative   Lives with daughter   Family History  Problem Relation Age of Onset  . Heart disease Sister   . Stroke Neg Hx   . CAD Neg Hx   . Cancer Neg Hx    Scheduled Meds: .  stroke: mapping our early  stages of recovery book   Does not apply Once  . Chlorhexidine Gluconate Cloth  6 each Topical Q0600  .  docusate sodium  100 mg Oral BID  . hydrocerin  1 application Topical Daily  . Influenza vac split quadrivalent PF  0.5 mL Intramuscular Tomorrow-1000  . mupirocin ointment  1 application Nasal BID  . nystatin   Topical TID  . pantoprazole  40 mg Oral Daily   Or  . pantoprazole sodium  40 mg Per Tube Daily   Continuous Infusions: . sodium chloride    . levETIRAcetam 500 mg (09/03/18 0426)   PRN Meds:.acetaminophen **OR** acetaminophen (TYLENOL) oral liquid 160 mg/5 mL **OR** acetaminophen, morphine injection, ondansetron **OR** ondansetron (ZOFRAN) IV Medications Prior to Admission:  Prior to Admission medications   Medication Sig Start Date End Date Taking? Authorizing Provider  furosemide (LASIX) 20 MG tablet Take 1 tablet (20 mg total) by mouth daily. 08/22/18  Yes Standley Brooking, MD  memantine (NAMENDA) 10 MG tablet Take 1 tablet (10 mg total) by mouth 2 (two) times daily. 01/12/18  Yes Butch Penny, NP  metoprolol succinate (TOPROL-XL) 25 MG 24 hr tablet Take 1 tablet (25 mg total) by mouth daily. 07/17/15  Yes Robbie Lis M, PA-C  Multiple Vitamin (MULTIVITAMIN WITH MINERALS) TABS tablet Take 1 tablet by mouth daily. 08/23/18  Yes Standley Brooking, MD  polyethylene glycol [facility] Ethelene Hal) packet Take 17 g by mouth daily as needed for mild constipation.    Yes [provider]  Amino Acids-Protein Hydrolys (FEEDING SUPPLEMENT, PRO-STAT SUGAR FREE 64,) LIQD Take 30 mLs by mouth 2 (two) times daily. 08/22/18   Standley Brooking, MD  ezetimibe-simvastatin (VYTORIN) 10-20 MG per tablet Take 1 tablet by mouth daily.     [provider]  hydrocerin (EUCERIN) CREA Apply 1 application topically daily. Apply Eucerin cream to bilat legs Q day after bathing and roughly towel drying to assist with removal of loose skin 08/23/18   Standley Brooking, MD  nystatin  (MYCOSTATIN/NYSTOP) powder Apply topically 3 (three) times daily. Apply to affected areas for cutaneous candidiasis 08/22/18   Standley Brooking, MD  traMADol (ULTRAM) 50 MG tablet Take 1 tablet (50 mg total) by mouth 2 (two) times daily. 08/22/18   Standley Brooking, MD   No Known Allergies Review of Systems  Unable to perform ROS   Physical Exam  Constitutional: No distress.  Pulmonary/Chest: Effort normal.  Skin: Skin is warm and dry.    Vital Signs: BP (!) 122/58 (BP Location: Right Arm)   Pulse (!) 112   Temp 98.4 F (36.9 C) (Oral)   Resp 20   SpO2 97%  Pain Scale: PAINAD   Pain Score: 6    SpO2: SpO2: 97 % O2 Device:SpO2: 97 % O2 Flow Rate: .   IO: Intake/output summary:   Intake/Output Summary (Last 24 hours) at 09/03/2018 1411 Last data filed at 09/03/2018 1224 Gross per 24 hour  Intake 0 ml  Output 500 ml  Net -500 ml    LBM:   Baseline Weight:   Most recent weight:       Palliative Assessment/Data:     Time In: 2:00 Time Out: 4:30 Time Total: 2.5 hours Greater than 50%  of this time was spent counseling and coordinating care related to the above assessment and plan.  Signed by: Morton Stall, NP   Please contact Palliative Medicine Team phone at [phone] for questions and concerns.  For individual provider: See Loretha Stapler

## 2018-09-03 NOTE — Progress Notes (Signed)
PT Cancellation Note  Patient Details Name: [name] MRN: [id number] DOB: 1924/03/01   Cancelled Treatment:    Reason Eval/Treat Not Completed: Other (comment) Per OT, RN requesting to hold on PT for today secondary to increased agitation. Will follow up as pt appropriate and as schedule allows.   Gladys DammeBrittany Kit Brubacher, PT, DPT  Acute Rehabilitation Services  Pager: [phone] Office: [phone]    Lehman PromBrittany S Broghan Pannone 09/03/2018, 10:10 AM

## 2018-09-03 NOTE — Progress Notes (Addendum)
OT Cancellation Note  Patient Details Name: [name] MRN: [id number] DOB: Aug 31, 1924   Cancelled Treatment:    Reason Eval/Treat Not Completed: Other (comment). Per conversation with nurse and observation of pt, pt is not appropriate for OT eval today due to agitation. Pt noted to be screaming off and on. Plan to reattempt tomorrow as able.   Raynald KempKathryn Keyan Folson, OT Acute Rehabilitation Services Pager: [phone] Office: [phone]  09/03/2018, 10:08 AM

## 2018-09-03 NOTE — Progress Notes (Signed)
Patient appears to be resting quietly at this time after bath and peri care performed along with administration of oral zyprexa. [facility]   Sim BoastHavy, RN

## 2018-09-03 NOTE — Progress Notes (Signed)
PROGRESS NOTE    [name]  ZOX:[id number] DOB: 12/14/1923 DOA: 09/02/2018 PCP: Merri Brunette, MD   Brief Narrative:  HPI on 09/02/2018  [name] is a 84 y.o. female with a medical history of dementia, hypertension, hyperlipidemia, history of DVT/PE on Eliquis, presented to the emergency department after falling at [facility] house.  Patient is unable to provide history, some obtained from daughter, some obtained from EDP.The patient was brought in by EMS.  It seems that she had an unwitnessed fall out of her wheelchair today.  Patient did sustain some head trauma but unclear if she lost consciousness.  She does have several lacerations on her face.  It was recently hospitalized 08/18/2018 through 08/24/2018 and found to have urinary tract infection and cellulitis.  She was discharged to rehab.  Patient's daughter states that she has been taking care of her mother for the past 3 years but has been requiring assistance with ambulation as well as daily activities.  Interim history Admitted for [facility]. Pending palliative care consult. Assessment & Plan   Subarachnoid hemorrhage -status post fall, in the setting of Eliquis -Seems that patient fell out of her wheelchair earlier today -CT head showed acute hemorrhage in the paramedian parietal lobes bilaterally measuring up to 2.4 cm, 4 cc with small volume of subarachnoid hemorrhage along the dorsal falx.  Mild local mass-effect without herniation.  Several additional tiny acute hemorrhage scattered throughout the brain and cortical distribution somewhat atypical for trauma and may represent microhemorrhage related to amyloid disease, hypertension or metastasis. -CT maxillofacial unremarkable for displaced facial fracture.   -CT cervical spine no acute fracture dislocation. -Patient currently DNR.  Discussed with her daughter at bedside comfort care measures and goals of care.  Daughter agrees to palliative care consult but is not ready to  make this decision. -Placed on IV morphine every 4 hours as needed fior pain control -Continue neuro checks -Continue NPO, speech therapy consulted  -Given the patient is on Eliquis, received Kcentra for reversal -Neurosurgery consulted and appreciated, repeat imaging tomorrow and Keppra 500 mg twice daily x7 days for seizure prophylaxis. -CT today shows worsening posttraumatic sequela of closed head injury with increased bilateral parietal hemorrhages now greater than 2 cm, new 1 cm right occipital parenchymal hemorrhage and worsening occipital subarachnoid blood.  Dementia -Stable, continue to hold Namenda  Essential hypertension -Hold Lasix and metoprolol.  If needed will place on IV hydralazine  Hyperlipidemia -Hold Vytorin  Critical aortic stenosis -Upon last admission, patient was to follow with outpatient cardiology -Given her age and fragility, she likely would not be a candidate for intervention at this point  History of DVT/PE -As above patient was on Eliquis, now will be given Kcentra  Normocytic Anemia/Acute blood loss anemia -Hemoglobin down to 10.6 -Continue to monitor CBC  Goals of care -Discussed with patient's daughter at bedside, feel the patient has poor prognosis overall.  Patient currently is DNR however discussed possibly moving more towards comfort measures.  Patient's daughter does want her to be comfortable however not ready to make this decision.    Placed on IV morphine every 4 hours for pain.   -Palliative care consulted and appreciated -Patient was given Haldol without any improvement in her agitation.  She currently appears to be an active pain. -Pending decision from daughter regarding possible upon comfort care measures. -Given Zyprexa  DVT Prophylaxis SCDs  Code Status: DNR  Family Communication: None at bedside  Disposition Plan: Admitted.  Consultants Neurosurgery Palliative care  Procedures  None  Antibiotics     Anti-infectives (From admission, onward)   None      Subjective:   [name] seen and examined today.  Yelling out for Myriam Jacobson and her mother. Not cooperative with exam.   Objective:   Vitals:   09/03/18 0338 09/03/18 0415 09/03/18 0844 09/03/18 1219  BP: (!) 123/46 114/85 (!) 158/96 (!) 122/58  Pulse: 89  (!) 111 (!) 112  Resp: 18  18 20   Temp: 98.3 F (36.8 C)  98.6 F (37 C) 98.4 F (36.9 C)  TempSrc: Axillary  Oral Oral  SpO2: 96% 95% 95% 97%    Intake/Output Summary (Last 24 hours) at 09/03/2018 1519 Last data filed at 09/03/2018 1224 Gross per 24 hour  Intake 0 ml  Output 500 ml  Net -500 ml   There were no vitals filed for this visit.  Exam  General: Well developed, chronically ill appearing, mild distress  HEENT: NCAT, mucous membranes moist.   Neck: Supple  Cardiovascular: S1 S2 auscultated, no murmurs, RRR  Respiratory: Clear to auscultation bilaterally with equal chest rise  Abdomen: Soft, nontender, nondistended, + bowel sounds  Extremities: warm dry without cyanosis clubbing. LE venous stasis skin changes   Neuro: Awake, not cooperative with exam   Psych: Agitated    Data Reviewed: I have personally reviewed following labs and imaging studies  CBC: Recent Labs  Lab 09/02/18 1431 09/02/18 2044 09/03/18 0347  WBC 9.6 10.3 9.4  NEUTROABS 5.6  --   --   HGB 12.7 11.4* 10.6*  HCT 42.4 38.9 35.2*  MCV 94.0 92.8 91.7  PLT 281 278 248   Basic Metabolic Panel: Recent Labs  Lab 09/02/18 1431 09/03/18 0347  NA 139 140  K 4.0 3.9  CL 103 106  CO2 27 24  GLUCOSE 158* 99  BUN 22 15  CREATININE 0.85 0.73  CALCIUM 8.8* 8.3*   GFR: CrCl cannot be calculated (Unknown ideal weight.). Liver Function Tests: No results for input(s): AST, ALT, ALKPHOS, BILITOT, PROT, ALBUMIN in the last 168 hours. No results for input(s): LIPASE, AMYLASE in the last 168 hours. No results for input(s): AMMONIA in the last 168 hours. Coagulation  Profile: Recent Labs  Lab 09/02/18 2044  INR 1.15   Cardiac Enzymes: No results for input(s): CKTOTAL, CKMB, CKMBINDEX, TROPONINI in the last 168 hours. BNP (last 3 results) No results for input(s): PROBNP in the last 8760 hours. HbA1C: No results for input(s): HGBA1C in the last 72 hours. CBG: No results for input(s): GLUCAP in the last 168 hours. Lipid Profile: No results for input(s): CHOL, HDL, LDLCALC, TRIG, CHOLHDL, LDLDIRECT in the last 72 hours. Thyroid Function Tests: No results for input(s): TSH, T4TOTAL, FREET4, T3FREE, THYROIDAB in the last 72 hours. Anemia Panel: No results for input(s): VITAMINB12, FOLATE, FERRITIN, TIBC, IRON, RETICCTPCT in the last 72 hours. Urine analysis:    Component Value Date/Time   COLORURINE YELLOW 09/03/2018 0359   APPEARANCEUR CLEAR 09/03/2018 0359   LABSPEC 1.017 09/03/2018 0359   PHURINE 7.0 09/03/2018 0359   GLUCOSEU NEGATIVE 09/03/2018 0359   HGBUR NEGATIVE 09/03/2018 0359   BILIRUBINUR NEGATIVE 09/03/2018 0359   KETONESUR 5 (A) 09/03/2018 0359   PROTEINUR NEGATIVE 09/03/2018 0359   NITRITE NEGATIVE 09/03/2018 0359   LEUKOCYTESUR NEGATIVE 09/03/2018 0359   Sepsis Labs: @LABRCNTIP (procalcitonin:4,lacticidven:4)  ) Recent Results (from the past 240 hour(s))  MRSA PCR Screening     Status: Abnormal   Collection Time: 09/02/18 10:25 PM  Result  Value Ref Range Status   MRSA by PCR POSITIVE (A) NEGATIVE Final    Comment:        The GeneXpert MRSA Assay (FDA approved for NASAL specimens only), is one component of a comprehensive MRSA colonization surveillance program. It is not intended to diagnose MRSA infection nor to guide or monitor treatment for MRSA infections. RESULT CALLED TO, READ BACK BY AND VERIFIED WITH: I.NHOUYVANISVONG,RN AT 0121 BY L.PITT 09/03/18       Radiology Studies: Ct Head Wo Contrast  Result Date: 09/03/2018 CLINICAL DATA:  Unwitnessed fall. Patient presumably fell from wheelchair.  Parenchymal hemorrhages demonstrated on yesterday's exam. History of dementia. EXAM: CT HEAD WITHOUT CONTRAST TECHNIQUE: Contiguous axial images were obtained from the base of the skull through the vertex without intravenous contrast. COMPARISON:  09/02/2018. FINDINGS: The patient was unable to remain motionless for the exam. Small or subtle lesions could be overlooked. Brain: There is worsening of the BILATERAL parietal hemorrhages, demonstrated on yesterday's exam. These measure 22 x 24 mm on the LEFT and 21 x 25 mm on the RIGHT. Slight worsening edema surrounding both. New RIGHT occipital parenchymal hemorrhage, 7 x 11 mm also consistent with a posttraumatic shearing injury. Worsening subarachnoid blood involving the calcarine fissures. RIGHT frontal parenchymal contusion, 5 x 10 mm, stable. Motion degraded exam precludes evaluation of the other small bleeds, which are probably unchanged. Baseline atrophy with chronic microvascular ischemic change. Vascular: Calcification of the cavernous internal carotid arteries consistent with cerebrovascular atherosclerotic disease. No signs of intracranial large vessel occlusion. Skull: Calvarium intact. Sinuses/Orbits: No layering sinus fluid.  Grossly negative orbits. Other: None. IMPRESSION: Worsening of posttraumatic sequelae of closed head injury, with increased BILATERAL parietal hemorrhages, now each greater than 2 cm, with a new 1 cm RIGHT occipital parenchymal hemorrhage, and worsening occipital subarachnoid blood. Continued surveillance is warranted. A call has been placed to the ordering provider. Electronically Signed   By: Elsie Stain M.D.   On: 09/03/2018 08:52   Ct Head Wo Contrast  Result Date: 09/02/2018 CLINICAL DATA:  84 y/o F; unwitnessed fall. Laceration on the forehead with bleeding control. EXAM: CT HEAD WITHOUT CONTRAST CT MAXILLOFACIAL WITHOUT CONTRAST CT CERVICAL SPINE WITHOUT CONTRAST TECHNIQUE: Multidetector CT imaging of the head, cervical  spine, and maxillofacial structures were performed using the standard protocol without intravenous contrast. Multiplanar CT image reconstructions of the cervical spine and maxillofacial structures were also generated. COMPARISON:  08/18/2018 CT head. FINDINGS: CT HEAD FINDINGS Brain: Acute hemorrhage within the paramedian parietal lobes bilaterally with subarachnoid hemorrhage along the dorsal falx. Mild local mass effect without herniation. The larger left parietal focus of hemorrhage measures 2.0 x 1.6 x 2.4 cm (volume = 4 cm^3)(AP x ML x CC series 9, image 23 and series 11, image 56). There are several additional subcentimeter hyperdense foci scattered throughout the brain that are rounded and cortically based for example in the right parietal lobe (series 9, image 27). No hydrocephalus, findings of stroke, mass effect, or herniation. Nonspecific white matter hypodensities are compatible with mild to moderate chronic microvascular ischemic changes and there is stable volume loss of the brain. Vascular: Calcific atherosclerosis of carotid siphons. No hyperdense vessel identified. Skull: Motion artifact, no grossly displaced calvarial fracture. Other: None. CT MAXILLOFACIAL FINDINGS Osseous: Extensive motion artifact. No grossly displaced facial fracture identified. No mandibular dislocation. Orbits: Negative. No traumatic or inflammatory finding. Sinuses: Clear. Soft tissues: Negative. CT CERVICAL SPINE FINDINGS Alignment: C6-7, C7-T1, T1-2 grade 1 anterolisthesis is stable. Straightening of cervical  lordosis. Skull base and vertebrae: No acute fracture. No primary bone lesion or focal pathologic process. Soft tissues and spinal canal: No prevertebral fluid or swelling. No visible canal hematoma. Disc levels: Advanced disc and facet degenerative changes greatest at the C4-5 level where prominent disc osteophyte complex results in at least moderate spinal canal stenosis. Multilevel facet effusion, likely on  degenerative basis. Upper chest: Negative. Other: Calcific atherosclerosis of carotid bifurcations. IMPRESSION: CT head: 1. Motion degraded study.  No gross calvarial fracture. 2. Acute hemorrhages in the paramedian parietal lobes bilaterally measuring up to 2.4 cm, 4 cc with small volume of subarachnoid hemorrhage along dorsal falx. Mild local mass effect without herniation. 3. Several additional tiny acute hemorrhages scattered throughout the brain in cortical distribution are somewhat atypical for trauma and may represent microhemorrhage related to amyloid disease, hypertension, or possibly metastasis. When patient is able to hold still, consider MRI of the brain without and with contrast. 4. Stable mild to moderate chronic microvascular ischemic changes and volume loss of the brain for age. CT maxillofacial: 1. Motion artifact, no grossly displaced facial fracture identified. No mandibular dislocation. CT cervical spine: 1. No acute fracture or dislocation identified. 2. Stable advanced cervical spondylosis greatest at C4-5 level where there is at least moderate spinal canal stenosis. Critical Value/emergent results were called by telephone at the time of interpretation on 09/02/2018 at 3:36 pm to Dr. Leonia Corona , who verbally acknowledged these results. Electronically Signed   By: Mitzi Hansen M.D.   On: 09/02/2018 15:40   Ct Cervical Spine Wo Contrast  Result Date: 09/02/2018 CLINICAL DATA:  84 y/o F; unwitnessed fall. Laceration on the forehead with bleeding control. EXAM: CT HEAD WITHOUT CONTRAST CT MAXILLOFACIAL WITHOUT CONTRAST CT CERVICAL SPINE WITHOUT CONTRAST TECHNIQUE: Multidetector CT imaging of the head, cervical spine, and maxillofacial structures were performed using the standard protocol without intravenous contrast. Multiplanar CT image reconstructions of the cervical spine and maxillofacial structures were also generated. COMPARISON:  08/18/2018 CT head. FINDINGS: CT HEAD  FINDINGS Brain: Acute hemorrhage within the paramedian parietal lobes bilaterally with subarachnoid hemorrhage along the dorsal falx. Mild local mass effect without herniation. The larger left parietal focus of hemorrhage measures 2.0 x 1.6 x 2.4 cm (volume = 4 cm^3)(AP x ML x CC series 9, image 23 and series 11, image 56). There are several additional subcentimeter hyperdense foci scattered throughout the brain that are rounded and cortically based for example in the right parietal lobe (series 9, image 27). No hydrocephalus, findings of stroke, mass effect, or herniation. Nonspecific white matter hypodensities are compatible with mild to moderate chronic microvascular ischemic changes and there is stable volume loss of the brain. Vascular: Calcific atherosclerosis of carotid siphons. No hyperdense vessel identified. Skull: Motion artifact, no grossly displaced calvarial fracture. Other: None. CT MAXILLOFACIAL FINDINGS Osseous: Extensive motion artifact. No grossly displaced facial fracture identified. No mandibular dislocation. Orbits: Negative. No traumatic or inflammatory finding. Sinuses: Clear. Soft tissues: Negative. CT CERVICAL SPINE FINDINGS Alignment: C6-7, C7-T1, T1-2 grade 1 anterolisthesis is stable. Straightening of cervical lordosis. Skull base and vertebrae: No acute fracture. No primary bone lesion or focal pathologic process. Soft tissues and spinal canal: No prevertebral fluid or swelling. No visible canal hematoma. Disc levels: Advanced disc and facet degenerative changes greatest at the C4-5 level where prominent disc osteophyte complex results in at least moderate spinal canal stenosis. Multilevel facet effusion, likely on degenerative basis. Upper chest: Negative. Other: Calcific atherosclerosis of carotid bifurcations. IMPRESSION: CT head: 1.  Motion degraded study.  No gross calvarial fracture. 2. Acute hemorrhages in the paramedian parietal lobes bilaterally measuring up to 2.4 cm, 4 cc with  small volume of subarachnoid hemorrhage along dorsal falx. Mild local mass effect without herniation. 3. Several additional tiny acute hemorrhages scattered throughout the brain in cortical distribution are somewhat atypical for trauma and may represent microhemorrhage related to amyloid disease, hypertension, or possibly metastasis. When patient is able to hold still, consider MRI of the brain without and with contrast. 4. Stable mild to moderate chronic microvascular ischemic changes and volume loss of the brain for age. CT maxillofacial: 1. Motion artifact, no grossly displaced facial fracture identified. No mandibular dislocation. CT cervical spine: 1. No acute fracture or dislocation identified. 2. Stable advanced cervical spondylosis greatest at C4-5 level where there is at least moderate spinal canal stenosis. Critical Value/emergent results were called by telephone at the time of interpretation on 09/02/2018 at 3:36 pm to Dr. Leonia CoronaORTNI COUTURE , who verbally acknowledged these results. Electronically Signed   By: Mitzi HansenLance  Furusawa-Stratton M.D.   On: 09/02/2018 15:40   Dg Pelvis Portable  Result Date: 09/02/2018 CLINICAL DATA:  Un witnessed fall EXAM: PORTABLE PELVIS 1-2 VIEWS COMPARISON:  08/18/2018 FINDINGS: Pelvic ring is intact. Degenerative changes of the pubic symphysis and hip joints are seen. No definitive fracture is seen on this limited one view image. Calcified uterine fibroid change is noted. IMPRESSION: Degenerative change without acute abnormality. Electronically Signed   By: Alcide CleverMark  Lukens M.D.   On: 09/02/2018 20:41   Dg Chest Portable 1 View  Result Date: 09/02/2018 CLINICAL DATA:  Fall, bleeding from the right side of the forehead, uncooperative patient EXAM: PORTABLE CHEST 1 VIEW COMPARISON:  Portable chest x-ray of 08/18/2017 FINDINGS: No focal pneumonia or pleural effusion is seen. Moderate cardiomegaly is stable. The descending thoracic aorta appears somewhat ectatic. Old fractures  of the left and right proximal humeri are noted. The bones are osteopenic. IMPRESSION: 1. Stable moderate cardiomegaly.  No active lung disease. 2. Old fractures of the proximal humeri left-greater-than-right. There may be fusion of the left shoulder joint. Electronically Signed   By: Dwyane DeePaul  Barry M.D.   On: 09/02/2018 14:56   Ct Maxillofacial Wo Contrast  Result Date: 09/02/2018 CLINICAL DATA:  84 y/o F; unwitnessed fall. Laceration on the forehead with bleeding control. EXAM: CT HEAD WITHOUT CONTRAST CT MAXILLOFACIAL WITHOUT CONTRAST CT CERVICAL SPINE WITHOUT CONTRAST TECHNIQUE: Multidetector CT imaging of the head, cervical spine, and maxillofacial structures were performed using the standard protocol without intravenous contrast. Multiplanar CT image reconstructions of the cervical spine and maxillofacial structures were also generated. COMPARISON:  08/18/2018 CT head. FINDINGS: CT HEAD FINDINGS Brain: Acute hemorrhage within the paramedian parietal lobes bilaterally with subarachnoid hemorrhage along the dorsal falx. Mild local mass effect without herniation. The larger left parietal focus of hemorrhage measures 2.0 x 1.6 x 2.4 cm (volume = 4 cm^3)(AP x ML x CC series 9, image 23 and series 11, image 56). There are several additional subcentimeter hyperdense foci scattered throughout the brain that are rounded and cortically based for example in the right parietal lobe (series 9, image 27). No hydrocephalus, findings of stroke, mass effect, or herniation. Nonspecific white matter hypodensities are compatible with mild to moderate chronic microvascular ischemic changes and there is stable volume loss of the brain. Vascular: Calcific atherosclerosis of carotid siphons. No hyperdense vessel identified. Skull: Motion artifact, no grossly displaced calvarial fracture. Other: None. CT MAXILLOFACIAL FINDINGS Osseous: Extensive motion artifact. No  grossly displaced facial fracture identified. No mandibular  dislocation. Orbits: Negative. No traumatic or inflammatory finding. Sinuses: Clear. Soft tissues: Negative. CT CERVICAL SPINE FINDINGS Alignment: C6-7, C7-T1, T1-2 grade 1 anterolisthesis is stable. Straightening of cervical lordosis. Skull base and vertebrae: No acute fracture. No primary bone lesion or focal pathologic process. Soft tissues and spinal canal: No prevertebral fluid or swelling. No visible canal hematoma. Disc levels: Advanced disc and facet degenerative changes greatest at the C4-5 level where prominent disc osteophyte complex results in at least moderate spinal canal stenosis. Multilevel facet effusion, likely on degenerative basis. Upper chest: Negative. Other: Calcific atherosclerosis of carotid bifurcations. IMPRESSION: CT head: 1. Motion degraded study.  No gross calvarial fracture. 2. Acute hemorrhages in the paramedian parietal lobes bilaterally measuring up to 2.4 cm, 4 cc with small volume of subarachnoid hemorrhage along dorsal falx. Mild local mass effect without herniation. 3. Several additional tiny acute hemorrhages scattered throughout the brain in cortical distribution are somewhat atypical for trauma and may represent microhemorrhage related to amyloid disease, hypertension, or possibly metastasis. When patient is able to hold still, consider MRI of the brain without and with contrast. 4. Stable mild to moderate chronic microvascular ischemic changes and volume loss of the brain for age. CT maxillofacial: 1. Motion artifact, no grossly displaced facial fracture identified. No mandibular dislocation. CT cervical spine: 1. No acute fracture or dislocation identified. 2. Stable advanced cervical spondylosis greatest at C4-5 level where there is at least moderate spinal canal stenosis. Critical Value/emergent results were called by telephone at the time of interpretation on 09/02/2018 at 3:36 pm to Dr. Leonia Corona , who verbally acknowledged these results. Electronically Signed    By: Mitzi Hansen M.D.   On: 09/02/2018 15:40     Scheduled Meds: .  stroke: mapping our early stages of recovery book   Does not apply Once  . Chlorhexidine Gluconate Cloth  6 each Topical Q0600  . docusate sodium  100 mg Oral BID  . hydrocerin  1 application Topical Daily  . Influenza vac split quadrivalent PF  0.5 mL Intramuscular Tomorrow-1000  . mupirocin ointment  1 application Nasal BID  . nystatin   Topical TID  . pantoprazole  40 mg Oral Daily   Or  . pantoprazole sodium  40 mg Per Tube Daily   Continuous Infusions: . sodium chloride    . levETIRAcetam 500 mg (09/03/18 0426)     LOS: 1 day   Time Spent in minutes   45 minutes  Hurley Blevins D.O. on 09/03/2018 at 3:19 PM  Between 7am to 7pm - Please see pager noted on [email or web address]  After 7pm go to [email or web address]  And look for the night coverage person covering for me after hours  [facility] Office  [phone]

## 2018-09-03 NOTE — Progress Notes (Signed)
SLP Cancellation Note  Patient Details Name: [name] MRN: [id number] DOB: 04-29-1924   Cancelled treatment:       Reason Eval/Treat Not Completed: Medical issues which prohibited therapy. Received order for cognitive eval. Not appropriate given pt behavior, baseline dementia and plan of care. No swallow eval ordered, but discussed with RN who does not feel she would be able to participate at this time due to agitation. Will d/c cog eval, if swallow eval needed please order.   Harlon DittyBonnie Mariette Cowley, MA CCC-SLP  Acute Rehabilitation Services Pager [phone] Office [phone]  Claudine MoutonDeBlois, Jacey Pelc Caroline 09/03/2018, 12:01 PM

## 2018-09-03 NOTE — Progress Notes (Signed)
Patient continue to scream and calling out names of  "[name]" and "[name]" after administration of haldol and morphine. Pt noticed clutching to self each time staffs gently touch patient to perform ADLs. Daughter, [name] called and updated POC. Per dght, pt seldom "yell out or scream" and this is new to [place]. No other distress noted. Will continue to monitor.  Sim BoastHavy, RN

## 2018-09-04 LAB — CBC
HEMATOCRIT: 39.3 % (ref 36.0–46.0)
Hemoglobin: 11.7 g/dL — ABNORMAL LOW (ref 12.0–15.0)
MCH: 27.1 pg (ref 26.0–34.0)
MCHC: 29.8 g/dL — ABNORMAL LOW (ref 30.0–36.0)
MCV: 91 fL (ref 80.0–100.0)
NRBC: 0 % (ref 0.0–0.2)
PLATELETS: 261 10*3/uL (ref 150–400)
RBC: 4.32 MIL/uL (ref 3.87–5.11)
RDW: 15.9 % — ABNORMAL HIGH (ref 11.5–15.5)
WBC: 9.2 10*3/uL (ref 4.0–10.5)

## 2018-09-04 MED ORDER — WHITE PETROLATUM EX OINT
TOPICAL_OINTMENT | CUTANEOUS | Status: AC
  Administered 2018-09-04: 1
  Filled 2018-09-04: qty 28.35

## 2018-09-04 NOTE — Progress Notes (Signed)
OT Cancellation Note  Patient Details Name: Cheryll DessertColleen M Dibartolo MRN: [id number] DOB: [birth year]   Cancelled Treatment:    Reason Eval/Treat Not Completed: Active bedrest order. Will continue to follow and initiate OT evaluation with bedrest order is lifted.  Chancy Milroyhristie S Asmar Brozek, OT Acute Rehabilitation Services Pager [phone] Office [phone]   Chancy MilroyChristie S Londan Coplen 09/04/2018, 10:02 AM

## 2018-09-04 NOTE — Progress Notes (Signed)
PT Cancellation Note  Patient Details Name: Karen DessertColleen M Prust MRN: [id number] DOB: 1924-10-09   Cancelled Treatment:    Reason Eval/Treat Not Completed: Active bedrest order. PT will continue to follow acutely and await updated activity orders prior to initiating PT evaluation.    Alessandra BevelsJennifer M Latoya Maulding 09/04/2018, 8:18 AM

## 2018-09-04 NOTE — Progress Notes (Signed)
PROGRESS NOTE    [name]  ZOX:[id number] DOB: 02/14/1924 DOA: 09/02/2018 PCP: Merri Brunette, MD   Brief Narrative:  HPI on 09/02/2018  Karen BARBIAN is a 84 y.o. female with a medical history of dementia, hypertension, hyperlipidemia, history of DVT/PE on Eliquis, presented to the emergency department after falling at [facility] house.  Patient is unable to provide history, some obtained from daughter, some obtained from EDP.The patient was brought in by EMS.  It seems that she had an unwitnessed fall out of her wheelchair today.  Patient did sustain some head trauma but unclear if she lost consciousness.  She does have several lacerations on her face.  It was recently hospitalized 08/18/2018 through 08/24/2018 and found to have urinary tract infection and cellulitis.  She was discharged to rehab.  Patient's daughter states that she has been taking care of her mother for the past 3 years but has been requiring assistance with ambulation as well as daily activities.  Interim history Admitted for [facility]  Assessment & Plan   Subarachnoid hemorrhage -status post fall, in the setting of Eliquis -Seems that patient fell out of her wheelchair earlier today -CT head showed acute hemorrhage in the paramedian parietal lobes bilaterally measuring up to 2.4 cm, 4 cc with small volume of subarachnoid hemorrhage along the dorsal falx.  Mild local mass-effect without herniation.  Several additional tiny acute hemorrhage scattered throughout the brain and cortical distribution somewhat atypical for trauma and may represent microhemorrhage related to amyloid disease, hypertension or metastasis. -CT maxillofacial unremarkable for displaced facial fracture.   -CT cervical spine no acute fracture dislocation. -Patient currently DNR.  Discussed with her daughter at bedside comfort care measures and goals of care.  Daughter agrees to palliative care consult but is not ready to make this decision. -Placed on  IV morphine every 4 hours as needed fior pain control -Continue neuro checks -Continue NPO, speech therapy consulted  -Given the patient is on Eliquis, received Kcentra for reversal -Neurosurgery consulted and appreciated, repeat imaging tomorrow and Keppra 500 mg twice daily x7 days for seizure prophylaxis. -CT today shows worsening posttraumatic sequela of closed head injury with increased bilateral parietal hemorrhages now greater than 2 cm, new 1 cm right occipital parenchymal hemorrhage and worsening occipital subarachnoid blood.  Dementia -Stable, continue to hold Namenda  Essential hypertension -Hold Lasix and metoprolol.  If needed will place on IV hydralazine  Hyperlipidemia -Hold Vytorin  Critical aortic stenosis -Upon last admission, patient was to follow with outpatient cardiology -Given her age and fragility, she likely would not be a candidate for intervention at this point  History of DVT/PE -As above patient was on Eliquis, now will be given Kcentra  Normocytic Anemia/Acute blood loss anemia -Hemoglobin  11.7 -Continue to monitor CBC  Goals of care -Discussed with patient's daughter at bedside, feel the patient has poor prognosis overall.  Patient currently is DNR however discussed possibly moving more towards comfort measures.  Patient's daughter does want her to be comfortable however not ready to make this decision.    Placed on IV morphine every 4 hours for pain.   -Palliative care consulted and appreciated -Patient was given Haldol without any improvement in her agitation.  She currently appears to be an active pain. -Pending decision from daughter regarding possible upon comfort care measures. -Given Zyprexa  DVT Prophylaxis SCDs  Code Status: DNR  Family Communication: None at bedside  Disposition Plan: Admitted. Pending further decisions from family.  Consultants Neurosurgery Palliative  care  Procedures  None  Antibiotics     Anti-infectives (From admission, onward)   None      Subjective:   Ajah Vanhoose seen and examined today.  Currently sleeping comfortably.   Objective:   Vitals:   09/03/18 2013 09/03/18 2315 09/04/18 0335 09/04/18 0806  BP: (!) 143/92 (!) 143/64 (!) 113/57 (!) 116/51  Pulse: 98 (!) 101 84 74  Resp: 20 17 17 16   Temp: 98.8 F (37.1 C) 99.1 F (37.3 C) 98.6 F (37 C) 98.9 F (37.2 C)  TempSrc: Oral Oral Oral Axillary  SpO2: 95% 98% 99% 93%    Intake/Output Summary (Last 24 hours) at 09/04/2018 1001 Last data filed at 09/04/2018 0400 Gross per 24 hour  Intake 390.62 ml  Output 500 ml  Net -109.38 ml   There were no vitals filed for this visit.  Exam  General: Well developed, elderly, chronically ill appearing, NAD  HEENT: NCAT,  mucous membranes moist.   Neck: Supple  Cardiovascular: S1 S2 auscultated, RRR  Respiratory: Clear to auscultation bilaterally with equal chest rise  Abdomen: Soft, nontender, nondistended, + bowel sounds  Extremities: warm dry without cyanosis clubbing. +LE edema, LE venous stasis skin changes  Neuro: sleeping comfortably  Skin: multiple areas of ecchymosis   Data Reviewed: I have personally reviewed following labs and imaging studies  CBC: Recent Labs  Lab 09/02/18 1431 09/02/18 2044 09/03/18 0347 09/04/18 0439  WBC 9.6 10.3 9.4 9.2  NEUTROABS 5.6  --   --   --   HGB 12.7 11.4* 10.6* 11.7*  HCT 42.4 38.9 35.2* 39.3  MCV 94.0 92.8 91.7 91.0  PLT 281 278 248 261   Basic Metabolic Panel: Recent Labs  Lab 09/02/18 1431 09/03/18 0347  NA 139 140  K 4.0 3.9  CL 103 106  CO2 27 24  GLUCOSE 158* 99  BUN 22 15  CREATININE 0.85 0.73  CALCIUM 8.8* 8.3*   GFR: CrCl cannot be calculated (Unknown ideal weight.). Liver Function Tests: No results for input(s): AST, ALT, ALKPHOS, BILITOT, PROT, ALBUMIN in the last 168 hours. No results for input(s): LIPASE, AMYLASE in the last 168 hours. No results for input(s):  AMMONIA in the last 168 hours. Coagulation Profile: Recent Labs  Lab 09/02/18 2044  INR 1.15   Cardiac Enzymes: No results for input(s): CKTOTAL, CKMB, CKMBINDEX, TROPONINI in the last 168 hours. BNP (last 3 results) No results for input(s): PROBNP in the last 8760 hours. HbA1C: No results for input(s): HGBA1C in the last 72 hours. CBG: No results for input(s): GLUCAP in the last 168 hours. Lipid Profile: No results for input(s): CHOL, HDL, LDLCALC, TRIG, CHOLHDL, LDLDIRECT in the last 72 hours. Thyroid Function Tests: No results for input(s): TSH, T4TOTAL, FREET4, T3FREE, THYROIDAB in the last 72 hours. Anemia Panel: No results for input(s): VITAMINB12, FOLATE, FERRITIN, TIBC, IRON, RETICCTPCT in the last 72 hours. Urine analysis:    Component Value Date/Time   COLORURINE YELLOW 09/03/2018 0359   APPEARANCEUR CLEAR 09/03/2018 0359   LABSPEC 1.017 09/03/2018 0359   PHURINE 7.0 09/03/2018 0359   GLUCOSEU NEGATIVE 09/03/2018 0359   HGBUR NEGATIVE 09/03/2018 0359   BILIRUBINUR NEGATIVE 09/03/2018 0359   KETONESUR 5 (A) 09/03/2018 0359   PROTEINUR NEGATIVE 09/03/2018 0359   NITRITE NEGATIVE 09/03/2018 0359   LEUKOCYTESUR NEGATIVE 09/03/2018 0359   Sepsis Labs: @LABRCNTIP (procalcitonin:4,lacticidven:4)  ) Recent Results (from the past 240 hour(s))  MRSA PCR Screening     Status: Abnormal   Collection Time:  09/02/18 10:25 PM  Result Value Ref Range Status   MRSA by PCR POSITIVE (A) NEGATIVE Final    Comment:        The GeneXpert MRSA Assay (FDA approved for NASAL specimens only), is one component of a comprehensive MRSA colonization surveillance program. It is not intended to diagnose MRSA infection nor to guide or monitor treatment for MRSA infections. RESULT CALLED TO, READ BACK BY AND VERIFIED WITH: I.NHOUYVANISVONG,RN AT 0121 BY L.PITT 09/03/18       Radiology Studies: Ct Head Wo Contrast  Result Date: 09/03/2018 CLINICAL DATA:  Unwitnessed fall. Patient  presumably fell from wheelchair. Parenchymal hemorrhages demonstrated on yesterday's exam. History of dementia. EXAM: CT HEAD WITHOUT CONTRAST TECHNIQUE: Contiguous axial images were obtained from the base of the skull through the vertex without intravenous contrast. COMPARISON:  09/02/2018. FINDINGS: The patient was unable to remain motionless for the exam. Small or subtle lesions could be overlooked. Brain: There is worsening of the BILATERAL parietal hemorrhages, demonstrated on yesterday's exam. These measure 22 x 24 mm on the LEFT and 21 x 25 mm on the RIGHT. Slight worsening edema surrounding both. New RIGHT occipital parenchymal hemorrhage, 7 x 11 mm also consistent with a posttraumatic shearing injury. Worsening subarachnoid blood involving the calcarine fissures. RIGHT frontal parenchymal contusion, 5 x 10 mm, stable. Motion degraded exam precludes evaluation of the other small bleeds, which are probably unchanged. Baseline atrophy with chronic microvascular ischemic change. Vascular: Calcification of the cavernous internal carotid arteries consistent with cerebrovascular atherosclerotic disease. No signs of intracranial large vessel occlusion. Skull: Calvarium intact. Sinuses/Orbits: No layering sinus fluid.  Grossly negative orbits. Other: None. IMPRESSION: Worsening of posttraumatic sequelae of closed head injury, with increased BILATERAL parietal hemorrhages, now each greater than 2 cm, with a new 1 cm RIGHT occipital parenchymal hemorrhage, and worsening occipital subarachnoid blood. Continued surveillance is warranted. A call has been placed to the ordering provider. Electronically Signed   By: Elsie Stain M.D.   On: 09/03/2018 08:52   Ct Head Wo Contrast  Result Date: 09/02/2018 CLINICAL DATA:  84 y/o F; unwitnessed fall. Laceration on the forehead with bleeding control. EXAM: CT HEAD WITHOUT CONTRAST CT MAXILLOFACIAL WITHOUT CONTRAST CT CERVICAL SPINE WITHOUT CONTRAST TECHNIQUE: Multidetector  CT imaging of the head, cervical spine, and maxillofacial structures were performed using the standard protocol without intravenous contrast. Multiplanar CT image reconstructions of the cervical spine and maxillofacial structures were also generated. COMPARISON:  08/18/2018 CT head. FINDINGS: CT HEAD FINDINGS Brain: Acute hemorrhage within the paramedian parietal lobes bilaterally with subarachnoid hemorrhage along the dorsal falx. Mild local mass effect without herniation. The larger left parietal focus of hemorrhage measures 2.0 x 1.6 x 2.4 cm (volume = 4 cm^3)(AP x ML x CC series 9, image 23 and series 11, image 56). There are several additional subcentimeter hyperdense foci scattered throughout the brain that are rounded and cortically based for example in the right parietal lobe (series 9, image 27). No hydrocephalus, findings of stroke, mass effect, or herniation. Nonspecific white matter hypodensities are compatible with mild to moderate chronic microvascular ischemic changes and there is stable volume loss of the brain. Vascular: Calcific atherosclerosis of carotid siphons. No hyperdense vessel identified. Skull: Motion artifact, no grossly displaced calvarial fracture. Other: None. CT MAXILLOFACIAL FINDINGS Osseous: Extensive motion artifact. No grossly displaced facial fracture identified. No mandibular dislocation. Orbits: Negative. No traumatic or inflammatory finding. Sinuses: Clear. Soft tissues: Negative. CT CERVICAL SPINE FINDINGS Alignment: C6-7, C7-T1, T1-2 grade 1 anterolisthesis  is stable. Straightening of cervical lordosis. Skull base and vertebrae: No acute fracture. No primary bone lesion or focal pathologic process. Soft tissues and spinal canal: No prevertebral fluid or swelling. No visible canal hematoma. Disc levels: Advanced disc and facet degenerative changes greatest at the C4-5 level where prominent disc osteophyte complex results in at least moderate spinal canal stenosis. Multilevel  facet effusion, likely on degenerative basis. Upper chest: Negative. Other: Calcific atherosclerosis of carotid bifurcations. IMPRESSION: CT head: 1. Motion degraded study.  No gross calvarial fracture. 2. Acute hemorrhages in the paramedian parietal lobes bilaterally measuring up to 2.4 cm, 4 cc with small volume of subarachnoid hemorrhage along dorsal falx. Mild local mass effect without herniation. 3. Several additional tiny acute hemorrhages scattered throughout the brain in cortical distribution are somewhat atypical for trauma and may represent microhemorrhage related to amyloid disease, hypertension, or possibly metastasis. When patient is able to hold still, consider MRI of the brain without and with contrast. 4. Stable mild to moderate chronic microvascular ischemic changes and volume loss of the brain for age. CT maxillofacial: 1. Motion artifact, no grossly displaced facial fracture identified. No mandibular dislocation. CT cervical spine: 1. No acute fracture or dislocation identified. 2. Stable advanced cervical spondylosis greatest at C4-5 level where there is at least moderate spinal canal stenosis. Critical Value/emergent results were called by telephone at the time of interpretation on 09/02/2018 at 3:36 pm to Dr. Leonia Corona , who verbally acknowledged these results. Electronically Signed   By: Mitzi Hansen M.D.   On: 09/02/2018 15:40   Ct Cervical Spine Wo Contrast  Result Date: 09/02/2018 CLINICAL DATA:  84 y/o F; unwitnessed fall. Laceration on the forehead with bleeding control. EXAM: CT HEAD WITHOUT CONTRAST CT MAXILLOFACIAL WITHOUT CONTRAST CT CERVICAL SPINE WITHOUT CONTRAST TECHNIQUE: Multidetector CT imaging of the head, cervical spine, and maxillofacial structures were performed using the standard protocol without intravenous contrast. Multiplanar CT image reconstructions of the cervical spine and maxillofacial structures were also generated. COMPARISON:  08/18/2018 CT  head. FINDINGS: CT HEAD FINDINGS Brain: Acute hemorrhage within the paramedian parietal lobes bilaterally with subarachnoid hemorrhage along the dorsal falx. Mild local mass effect without herniation. The larger left parietal focus of hemorrhage measures 2.0 x 1.6 x 2.4 cm (volume = 4 cm^3)(AP x ML x CC series 9, image 23 and series 11, image 56). There are several additional subcentimeter hyperdense foci scattered throughout the brain that are rounded and cortically based for example in the right parietal lobe (series 9, image 27). No hydrocephalus, findings of stroke, mass effect, or herniation. Nonspecific white matter hypodensities are compatible with mild to moderate chronic microvascular ischemic changes and there is stable volume loss of the brain. Vascular: Calcific atherosclerosis of carotid siphons. No hyperdense vessel identified. Skull: Motion artifact, no grossly displaced calvarial fracture. Other: None. CT MAXILLOFACIAL FINDINGS Osseous: Extensive motion artifact. No grossly displaced facial fracture identified. No mandibular dislocation. Orbits: Negative. No traumatic or inflammatory finding. Sinuses: Clear. Soft tissues: Negative. CT CERVICAL SPINE FINDINGS Alignment: C6-7, C7-T1, T1-2 grade 1 anterolisthesis is stable. Straightening of cervical lordosis. Skull base and vertebrae: No acute fracture. No primary bone lesion or focal pathologic process. Soft tissues and spinal canal: No prevertebral fluid or swelling. No visible canal hematoma. Disc levels: Advanced disc and facet degenerative changes greatest at the C4-5 level where prominent disc osteophyte complex results in at least moderate spinal canal stenosis. Multilevel facet effusion, likely on degenerative basis. Upper chest: Negative. Other: Calcific atherosclerosis of carotid  bifurcations. IMPRESSION: CT head: 1. Motion degraded study.  No gross calvarial fracture. 2. Acute hemorrhages in the paramedian parietal lobes bilaterally measuring  up to 2.4 cm, 4 cc with small volume of subarachnoid hemorrhage along dorsal falx. Mild local mass effect without herniation. 3. Several additional tiny acute hemorrhages scattered throughout the brain in cortical distribution are somewhat atypical for trauma and may represent microhemorrhage related to amyloid disease, hypertension, or possibly metastasis. When patient is able to hold still, consider MRI of the brain without and with contrast. 4. Stable mild to moderate chronic microvascular ischemic changes and volume loss of the brain for age. CT maxillofacial: 1. Motion artifact, no grossly displaced facial fracture identified. No mandibular dislocation. CT cervical spine: 1. No acute fracture or dislocation identified. 2. Stable advanced cervical spondylosis greatest at C4-5 level where there is at least moderate spinal canal stenosis. Critical Value/emergent results were called by telephone at the time of interpretation on 09/02/2018 at 3:36 pm to Dr. Leonia Corona , who verbally acknowledged these results. Electronically Signed   By: Mitzi Hansen M.D.   On: 09/02/2018 15:40   Dg Pelvis Portable  Result Date: 09/02/2018 CLINICAL DATA:  Un witnessed fall EXAM: PORTABLE PELVIS 1-2 VIEWS COMPARISON:  08/18/2018 FINDINGS: Pelvic ring is intact. Degenerative changes of the pubic symphysis and hip joints are seen. No definitive fracture is seen on this limited one view image. Calcified uterine fibroid change is noted. IMPRESSION: Degenerative change without acute abnormality. Electronically Signed   By: Alcide Clever M.D.   On: 09/02/2018 20:41   Dg Chest Portable 1 View  Result Date: 09/02/2018 CLINICAL DATA:  Fall, bleeding from the right side of the forehead, uncooperative patient EXAM: PORTABLE CHEST 1 VIEW COMPARISON:  Portable chest x-ray of 08/18/2017 FINDINGS: No focal pneumonia or pleural effusion is seen. Moderate cardiomegaly is stable. The descending thoracic aorta appears somewhat  ectatic. Old fractures of the left and right proximal humeri are noted. The bones are osteopenic. IMPRESSION: 1. Stable moderate cardiomegaly.  No active lung disease. 2. Old fractures of the proximal humeri left-greater-than-right. There may be fusion of the left shoulder joint. Electronically Signed   By: Dwyane Dee M.D.   On: 09/02/2018 14:56   Ct Maxillofacial Wo Contrast  Result Date: 09/02/2018 CLINICAL DATA:  84 y/o F; unwitnessed fall. Laceration on the forehead with bleeding control. EXAM: CT HEAD WITHOUT CONTRAST CT MAXILLOFACIAL WITHOUT CONTRAST CT CERVICAL SPINE WITHOUT CONTRAST TECHNIQUE: Multidetector CT imaging of the head, cervical spine, and maxillofacial structures were performed using the standard protocol without intravenous contrast. Multiplanar CT image reconstructions of the cervical spine and maxillofacial structures were also generated. COMPARISON:  08/18/2018 CT head. FINDINGS: CT HEAD FINDINGS Brain: Acute hemorrhage within the paramedian parietal lobes bilaterally with subarachnoid hemorrhage along the dorsal falx. Mild local mass effect without herniation. The larger left parietal focus of hemorrhage measures 2.0 x 1.6 x 2.4 cm (volume = 4 cm^3)(AP x ML x CC series 9, image 23 and series 11, image 56). There are several additional subcentimeter hyperdense foci scattered throughout the brain that are rounded and cortically based for example in the right parietal lobe (series 9, image 27). No hydrocephalus, findings of stroke, mass effect, or herniation. Nonspecific white matter hypodensities are compatible with mild to moderate chronic microvascular ischemic changes and there is stable volume loss of the brain. Vascular: Calcific atherosclerosis of carotid siphons. No hyperdense vessel identified. Skull: Motion artifact, no grossly displaced calvarial fracture. Other: None. CT MAXILLOFACIAL FINDINGS  Osseous: Extensive motion artifact. No grossly displaced facial fracture identified.  No mandibular dislocation. Orbits: Negative. No traumatic or inflammatory finding. Sinuses: Clear. Soft tissues: Negative. CT CERVICAL SPINE FINDINGS Alignment: C6-7, C7-T1, T1-2 grade 1 anterolisthesis is stable. Straightening of cervical lordosis. Skull base and vertebrae: No acute fracture. No primary bone lesion or focal pathologic process. Soft tissues and spinal canal: No prevertebral fluid or swelling. No visible canal hematoma. Disc levels: Advanced disc and facet degenerative changes greatest at the C4-5 level where prominent disc osteophyte complex results in at least moderate spinal canal stenosis. Multilevel facet effusion, likely on degenerative basis. Upper chest: Negative. Other: Calcific atherosclerosis of carotid bifurcations. IMPRESSION: CT head: 1. Motion degraded study.  No gross calvarial fracture. 2. Acute hemorrhages in the paramedian parietal lobes bilaterally measuring up to 2.4 cm, 4 cc with small volume of subarachnoid hemorrhage along dorsal falx. Mild local mass effect without herniation. 3. Several additional tiny acute hemorrhages scattered throughout the brain in cortical distribution are somewhat atypical for trauma and may represent microhemorrhage related to amyloid disease, hypertension, or possibly metastasis. When patient is able to hold still, consider MRI of the brain without and with contrast. 4. Stable mild to moderate chronic microvascular ischemic changes and volume loss of the brain for age. CT maxillofacial: 1. Motion artifact, no grossly displaced facial fracture identified. No mandibular dislocation. CT cervical spine: 1. No acute fracture or dislocation identified. 2. Stable advanced cervical spondylosis greatest at C4-5 level where there is at least moderate spinal canal stenosis. Critical Value/emergent results were called by telephone at the time of interpretation on 09/02/2018 at 3:36 pm to Dr. Leonia CoronaORTNI COUTURE , who verbally acknowledged these results.  Electronically Signed   By: Mitzi HansenLance  Furusawa-Stratton M.D.   On: 09/02/2018 15:40     Scheduled Meds: .  stroke: mapping our early stages of recovery book   Does not apply Once  . Chlorhexidine Gluconate Cloth  6 each Topical Q0600  . docusate sodium  100 mg Oral BID  . hydrocerin  1 application Topical Daily  . Influenza vac split quadrivalent PF  0.5 mL Intramuscular Tomorrow-1000  . mupirocin ointment  1 application Nasal BID  . nystatin   Topical TID  . pantoprazole  40 mg Oral Daily   Or  . pantoprazole sodium  40 mg Per Tube Daily   Continuous Infusions: . sodium chloride 100 mL/hr at 09/04/18 0154  . levETIRAcetam 500 mg (09/04/18 0530)     LOS: 2 days   Time Spent in minutes   20 minutes  Suleika Donavan D.O. on 09/04/2018 at 10:01 AM  Between 7am to 7pm - Please see pager noted on [email or web address]  After 7pm go to [email or web address]  And look for the night coverage person covering for me after hours  [facility] Office  [phone]

## 2018-09-05 LAB — HEMOGLOBIN AND HEMATOCRIT, BLOOD
HEMATOCRIT: 39.6 % (ref 36.0–46.0)
Hemoglobin: 11.9 g/dL — ABNORMAL LOW (ref 12.0–15.0)

## 2018-09-05 NOTE — Progress Notes (Signed)
PT Cancellation and Discharge Note  Patient Details Name: Karen DessertColleen M Moroni MRN: [id number] DOB: 1924/02/12   Cancelled Treatment:    Reason Eval/Treat Not Completed: Other (comment)   Poor prognosis; Per Dr. Catha GosselinMikhail, PT does not have a role in her care at this time; Will sign off, and be happy to reconsult with new orders if indicated;   Van ClinesHolly Xian Alves, PT  Acute Rehabilitation Services Pager [phone] Office [phone]    Levi AlandHolly H Kadia Abaya 09/05/2018, 9:08 AM

## 2018-09-05 NOTE — Progress Notes (Signed)
OT Cancellation Note  Patient Details Name: Cheryll DessertColleen M Jonas MRN: [id number] DOB: [birth year]   Cancelled Treatment:    Reason Eval/Treat Not Completed: Other (comment)(Sign off at this time, per Dr. Catha GosselinMikhail.  ) Please re-consult if needed.   Chancy Milroyhristie S Marilee Ditommaso, OT Acute Rehabilitation Services Pager [phone] Office [phone]   Chancy MilroyChristie S Maryana Pittmon 09/05/2018, 8:51 AM

## 2018-09-05 NOTE — Progress Notes (Signed)
PROGRESS NOTE    [name]  ZOX:[id number] DOB: [birth year] DOA: 09/02/2018 PCP: Merri BrunetteSmith, Candace, MD   Brief Narrative:  HPI on 09/02/2018  [name] is a 84 y.o. female with a medical history of dementia, hypertension, hyperlipidemia, history of DVT/PE on Eliquis, [facility].  Patient is unable to provide history, some obtained from daughter, some obtained from EDP.The patient was brought in by EMS.  It seems that she had an unwitnessed fall out of her wheelchair today.  Patient did sustain some head trauma but unclear if she lost consciousness.  She does have several lacerations on her face.  It was recently hospitalized 08/18/2018 through 08/24/2018 and found to have urinary tract infection and cellulitis.  She was discharged to rehab.  Patient's daughter states that she has been taking care of her mother for the past 3 years but has been requiring assistance with ambulation as well as daily activities.  Interim [facility] Pendign further recommendations from family.   Assessment & Plan   Subarachnoid hemorrhage -status post fall, in the setting of Eliquis -Seems that patient fell out of her wheelchair earlier today -CT head showed acute hemorrhage in the paramedian parietal lobes bilaterally measuring up to 2.4 cm, 4 cc with small volume of subarachnoid hemorrhage along the dorsal falx.  Mild local mass-effect without herniation.  Several additional tiny acute hemorrhage scattered throughout the brain and cortical distribution somewhat atypical for trauma and may represent microhemorrhage related to amyloid disease, hypertension or metastasis. -CT maxillofacial unremarkable for displaced facial fracture.   -CT cervical spine no acute fracture dislocation. -Patient currently DNR.  Discussed with her daughter at bedside comfort care measures and goals of care.  Daughter agrees to palliative care consult but  is not ready to make this decision. -Placed on IV morphine every 4 hours as needed fior pain control -Continue neuro checks -Continue NPO, speech therapy consulted  -Given the patient is on Eliquis, received Kcentra for reversal -Neurosurgery consulted and appreciated, repeat imaging tomorrow and Keppra 500 mg twice daily x7 days for seizure prophylaxis. -CT today shows worsening posttraumatic sequela of closed head injury with increased bilateral parietal hemorrhages now greater than 2 cm, new 1 cm right occipital parenchymal hemorrhage and worsening occipital subarachnoid blood.  Dementia -Stable, continue to hold Namenda  Essential hypertension -Hold Lasix and metoprolol.  If needed will place on IV hydralazine  Hyperlipidemia -Hold Vytorin  Critical aortic stenosis -Upon last admission, patient was to follow with outpatient cardiology -Given her age and fragility, she likely would not be a candidate for intervention at this point  History of DVT/PE -As above patient was on Eliquis, now will be given Kcentra  Normocytic Anemia/Acute blood loss anemia -Hemoglobin  11.9- stable -Continue to monitor CBC  Goals of care -Discussed with patient's daughter at bedside, feel the patient has poor prognosis overall.  Patient currently is DNR however discussed possibly moving more towards comfort measures.  Patient's daughter does want her to be comfortable however not ready to make this decision.    Placed on IV morphine every 4 hours for pain.   -Palliative care consulted and appreciated -Patient was given Haldol without any improvement in her agitation.  She currently appears to be an active pain. -Pending decision from daughter regarding possible upon comfort care measures. -Given Zyprexa  DVT Prophylaxis SCDs  Code Status: DNR  Family Communication: None at bedside. Unable to leave message with daughter.  Disposition Plan: Admitted. Pending further decisions from  family.  Consultants Neurosurgery Palliative care  Procedures  None  Antibiotics   Anti-infectives (From admission, onward)   None      Subjective:   [name] seen and examined today.  Currently sleeping comfortably.   Objective:   Vitals:   09/04/18 2026 09/05/18 0051 09/05/18 0508 09/05/18 0752  BP: (!) 120/50 (!) 129/59 (!) 121/52 (!) 107/37  Pulse: 90 100 92 98  Resp: 20 20 18 18   Temp: 98.2 F (36.8 C) 98.7 F (37.1 C) 99 F (37.2 C) (!) 97.5 F (36.4 C)  TempSrc: Oral Axillary Axillary Axillary  SpO2: 98% 100% 96% 92%    Intake/Output Summary (Last 24 hours) at 09/05/2018 1042 Last data filed at 09/04/2018 1831 Gross per 24 hour  Intake 1081.45 ml  Output -  Net 1081.45 ml   There were no vitals filed for this visit.  Exam  General: Well developed, elderly, chronically ill appearing, NAD  HEENT: NCAT,mucous membranes moist.   Cardiovascular: S1 S2 auscultated, RRR  Respiratory: Clear to auscultation bilaterally with equal chest rise  Abdomen: Soft, nontender, nondistended, + bowel sounds  Extremities: warm dry without cyanosis clubbing. LE edema with venous skin changes   Neuro: sleeping comfortably   Data Reviewed: I have personally reviewed following labs and imaging studies  CBC: Recent Labs  Lab 09/02/18 1431 09/02/18 2044 09/03/18 0347 09/04/18 0439 09/05/18 0437  WBC 9.6 10.3 9.4 9.2  --   NEUTROABS 5.6  --   --   --   --   HGB 12.7 11.4* 10.6* 11.7* 11.9*  HCT 42.4 38.9 35.2* 39.3 39.6  MCV 94.0 92.8 91.7 91.0  --   PLT 281 278 248 261  --    Basic Metabolic Panel: Recent Labs  Lab 09/02/18 1431 09/03/18 0347  NA 139 140  K 4.0 3.9  CL 103 106  CO2 27 24  GLUCOSE 158* 99  BUN 22 15  CREATININE 0.85 0.73  CALCIUM 8.8* 8.3*   GFR: CrCl cannot be calculated (Unknown ideal weight.). Liver Function Tests: No results for input(s): AST, ALT, ALKPHOS, BILITOT, PROT, ALBUMIN in the last 168 hours. No results for  input(s): LIPASE, AMYLASE in the last 168 hours. No results for input(s): AMMONIA in the last 168 hours. Coagulation Profile: Recent Labs  Lab 09/02/18 2044  INR 1.15   Cardiac Enzymes: No results for input(s): CKTOTAL, CKMB, CKMBINDEX, TROPONINI in the last 168 hours. BNP (last 3 results) No results for input(s): PROBNP in the last 8760 hours. HbA1C: No results for input(s): HGBA1C in the last 72 hours. CBG: No results for input(s): GLUCAP in the last 168 hours. Lipid Profile: No results for input(s): CHOL, HDL, LDLCALC, TRIG, CHOLHDL, LDLDIRECT in the last 72 hours. Thyroid Function Tests: No results for input(s): TSH, T4TOTAL, FREET4, T3FREE, THYROIDAB in the last 72 hours. Anemia Panel: No results for input(s): VITAMINB12, FOLATE, FERRITIN, TIBC, IRON, RETICCTPCT in the last 72 hours. Urine analysis:    Component Value Date/Time   COLORURINE YELLOW 09/03/2018 0359   APPEARANCEUR CLEAR 09/03/2018 0359   LABSPEC 1.017 09/03/2018 0359   PHURINE 7.0 09/03/2018 0359   GLUCOSEU NEGATIVE 09/03/2018 0359   HGBUR NEGATIVE 09/03/2018 0359   BILIRUBINUR NEGATIVE 09/03/2018 0359   KETONESUR 5 (A) 09/03/2018 0359   PROTEINUR NEGATIVE 09/03/2018 0359   NITRITE NEGATIVE 09/03/2018 0359   LEUKOCYTESUR NEGATIVE 09/03/2018 0359   Sepsis Labs: @LABRCNTIP (procalcitonin:4,lacticidven:4)  ) Recent Results (from the past 240  hour(s))  MRSA PCR Screening     Status: Abnormal   Collection Time: 09/02/18 10:25 PM  Result Value Ref Range Status   MRSA by PCR POSITIVE (A) NEGATIVE Final    Comment:        The GeneXpert MRSA Assay (FDA approved for NASAL specimens only), is one component of a comprehensive MRSA colonization surveillance program. It is not intended to diagnose MRSA infection nor to guide or monitor treatment for MRSA infections. RESULT CALLED TO, READ BACK BY AND VERIFIED WITH: I.NHOUYVANISVONG,RN AT 0121 BY L.PITT 09/03/18       Radiology Studies: No results  found.   Scheduled Meds: .  stroke: mapping our early stages of recovery book   Does not apply Once  . Chlorhexidine Gluconate Cloth  6 each Topical Q0600  . docusate sodium  100 mg Oral BID  . hydrocerin  1 application Topical Daily  . Influenza vac split quadrivalent PF  0.5 mL Intramuscular Tomorrow-1000  . mupirocin ointment  1 application Nasal BID  . nystatin   Topical TID  . pantoprazole  40 mg Oral Daily   Or  . pantoprazole sodium  40 mg Per Tube Daily   Continuous Infusions: . sodium chloride 100 mL/hr at 09/04/18 1541  . levETIRAcetam 500 mg (09/05/18 0506)     LOS: 3 days   Time Spent in minutes   20 minutes  Dashea Mcmullan D.O. on 09/05/2018 at 10:42 AM  Between 7am to 7pm - Please see pager noted on [email or web address]  After 7pm go to [email or web address]  And look for the night coverage person covering for me after hours  [facility] Office  [phone]

## 2018-09-05 NOTE — Progress Notes (Signed)
Patient continue to appears resting with eyes closed. Dgt, Junious Dresseronnie at bedside. Connie voice to RNt that she would like mom to be comfortable and pain free at this time and wishes to speak to MD in the AM. Emotional support given to family at this time. Cont to monitor.    Sim BoastHavy, RN

## 2018-09-06 DIAGNOSIS — I609 Nontraumatic subarachnoid hemorrhage, unspecified: Secondary | ICD-10-CM

## 2018-09-06 DIAGNOSIS — Z515 Encounter for palliative care: Secondary | ICD-10-CM

## 2018-09-06 DIAGNOSIS — Z7189 Other specified counseling: Secondary | ICD-10-CM

## 2018-09-06 NOTE — Progress Notes (Signed)
PROGRESS NOTE    [name]  ZOX:[id number] DOB: 1924/06/05 DOA: 09/02/2018 PCP: Merri BrunetteSmith, Candace, MD   Brief Narrative:  HPI on 09/02/2018  [name] is a 84 y.o. female with a medical history of dementia, hypertension, hyperlipidemia, history of DVT/PE on Eliquis, [facility] emergency department [facility]  Patient is unable to provide history, some obtained from daughter, some obtained from EDP.The patient was brought in by EMS.  It seems that she had an unwitnessed fall out of her wheelchair today.  Patient did sustain some head trauma but unclear if she lost consciousness.  She does have several lacerations on her face.  It was recently hospitalized 08/18/2018 through 08/24/2018 and found to have urinary tract infection and cellulitis.  She was discharged to rehab.  Patient's daughter states that she has been taking care of her mother for the past 3 years but has been requiring assistance with ambulation as well as daily activities.  Interim history [facility]. Pending further recommendations from family.   Assessment & Plan   Subarachnoid hemorrhage -status post fall, in the setting of Eliquis -Seems that patient fell out of her wheelchair earlier today -CT head showed acute hemorrhage in the paramedian parietal lobes bilaterally measuring up to 2.4 cm, 4 cc with small volume of subarachnoid hemorrhage along the dorsal falx.  Mild local mass-effect without herniation.  Several additional tiny acute hemorrhage scattered throughout the brain and cortical distribution somewhat atypical for trauma and may represent microhemorrhage related to amyloid disease, hypertension or metastasis. -CT maxillofacial unremarkable for displaced facial fracture.   -CT cervical spine no acute fracture dislocation. -Patient currently DNR.  Discussed with her daughter at bedside comfort care measures and goals of care.  Daughter agrees to palliative care consult but  is not ready to make this decision. -Placed on IV morphine every 4 hours as needed fior pain control -Continue neuro checks -Continue NPO, speech therapy consulted  -Given the patient is on Eliquis, received Kcentra for reversal -Neurosurgery consulted and appreciated, repeat imaging tomorrow and Keppra 500 mg twice daily x7 days for seizure prophylaxis. -CT today shows worsening posttraumatic sequela of closed head injury with increased bilateral parietal hemorrhages now greater than 2 cm, new 1 cm right occipital parenchymal hemorrhage and worsening occipital subarachnoid blood.  Dementia -Stable, continue to hold Namenda  Essential hypertension -Hold Lasix and metoprolol.  If needed will place on IV hydralazine  Hyperlipidemia -Hold Vytorin  Critical aortic stenosis -Upon last admission, patient was to follow with outpatient cardiology -Given her age and fragility, she likely would not be a candidate for intervention at this point  History of DVT/PE -As above patient was on Eliquis, now will be given Kcentra  Normocytic Anemia/Acute blood loss anemia -Hemoglobin  11.9- stable -Continue to monitor CBC  Goals of care -Discussed with patient's daughter at bedside, feel the patient has poor prognosis overall.  Patient currently is DNR however discussed possibly moving more towards comfort measures.  Patient's daughter does want her to be comfortable however not ready to make this decision.    Placed on IV morphine every 4 hours for pain.   -Palliative care consulted and appreciated -Patient was given Haldol without any improvement in her agitation.  She currently appears to be an active pain. -Given Zyprexa -Discussion with patient's daughter, Junious Kelley, on 09/05/2018.  He states that she would like for her mother to be comfortable and possibly return home with her if possible.  She  also states that she has healthcare power of attorney and will contact the attorney's office on  Monday, today, 09/06/2018, to obtain that paperwork.  DVT Prophylaxis SCDs  Code Status: DNR  Family Communication: None at bedside. Discussed with [name] 09/05/2018  Disposition Plan: Admitted. Pending further decisions from family. Dispo TBD  Consultants Neurosurgery Palliative care  Procedures  None  Antibiotics   Anti-infectives (From admission, onward)   None      Subjective:   [name] seen and examined today.  Patient with dementia. Currently resting comfortably.   Objective:   Vitals:   09/06/18 0105 09/06/18 0328 09/06/18 0817 09/06/18 1124  BP: 125/83 140/84 (!) 158/73 (!) 157/90  Pulse: 96 98 93 90  Resp: 18 18 16 19   Temp: 98 F (36.7 C) 98 F (36.7 C) 97.7 F (36.5 C) 97.7 F (36.5 C)  TempSrc: Oral Axillary Oral Oral  SpO2: 93% 98% 96% 99%    Intake/Output Summary (Last 24 hours) at 09/06/2018 1235 Last data filed at 09/06/2018 1145 Gross per 24 hour  Intake 2036.82 ml  Output -  Net 2036.82 ml   There were no vitals filed for this visit.  Exam  General: Well developed, elderly, chronically ill appearing, NAD  HEENT: NCAT, mucous membranes moist.   Cardiovascular: S1 S2 auscultated, RRR  Respiratory: Clear to auscultation bilaterally with equal chest rise  Abdomen: Soft, nontender, nondistended, + bowel sounds  Extremities: warm dry without cyanosis clubbing. LE edema with venous skin changes  Neuro: AAOx1 (self only), nonfocal   Data Reviewed: I have personally reviewed following labs and imaging studies  CBC: Recent Labs  Lab 09/02/18 1431 09/02/18 2044 09/03/18 0347 09/04/18 0439 09/05/18 0437  WBC 9.6 10.3 9.4 9.2  --   NEUTROABS 5.6  --   --   --   --   HGB 12.7 11.4* 10.6* 11.7* 11.9*  HCT 42.4 38.9 35.2* 39.3 39.6  MCV 94.0 92.8 91.7 91.0  --   PLT 281 278 248 261  --    Basic Metabolic Panel: Recent Labs  Lab 09/02/18 1431 09/03/18 0347  NA 139 140  K 4.0 3.9  CL 103 106  CO2 27 24  GLUCOSE 158*  99  BUN 22 15  CREATININE 0.85 0.73  CALCIUM 8.8* 8.3*   GFR: CrCl cannot be calculated (Unknown ideal weight.). Liver Function Tests: No results for input(s): AST, ALT, ALKPHOS, BILITOT, PROT, ALBUMIN in the last 168 hours. No results for input(s): LIPASE, AMYLASE in the last 168 hours. No results for input(s): AMMONIA in the last 168 hours. Coagulation Profile: Recent Labs  Lab 09/02/18 2044  INR 1.15   Cardiac Enzymes: No results for input(s): CKTOTAL, CKMB, CKMBINDEX, TROPONINI in the last 168 hours. BNP (last 3 results) No results for input(s): PROBNP in the last 8760 hours. HbA1C: No results for input(s): HGBA1C in the last 72 hours. CBG: No results for input(s): GLUCAP in the last 168 hours. Lipid Profile: No results for input(s): CHOL, HDL, LDLCALC, TRIG, CHOLHDL, LDLDIRECT in the last 72 hours. Thyroid Function Tests: No results for input(s): TSH, T4TOTAL, FREET4, T3FREE, THYROIDAB in the last 72 hours. Anemia Panel: No results for input(s): VITAMINB12, FOLATE, FERRITIN, TIBC, IRON, RETICCTPCT in the last 72 hours. Urine analysis:    Component Value Date/Time   COLORURINE YELLOW 09/03/2018 0359   APPEARANCEUR CLEAR 09/03/2018 0359   LABSPEC 1.017 09/03/2018 0359   PHURINE 7.0 09/03/2018 0359   GLUCOSEU NEGATIVE 09/03/2018 0359   HGBUR NEGATIVE  09/03/2018 0359   BILIRUBINUR NEGATIVE 09/03/2018 0359   KETONESUR 5 (A) 09/03/2018 0359   PROTEINUR NEGATIVE 09/03/2018 0359   NITRITE NEGATIVE 09/03/2018 0359   LEUKOCYTESUR NEGATIVE 09/03/2018 0359   Sepsis Labs: @LABRCNTIP (procalcitonin:4,lacticidven:4)  ) Recent Results (from the past 240 hour(s))  MRSA PCR Screening     Status: Abnormal   Collection Time: 09/02/18 10:25 PM  Result Value Ref Range Status   MRSA by PCR POSITIVE (A) NEGATIVE Final    Comment:        The GeneXpert MRSA Assay (FDA approved for NASAL specimens only), is one component of a comprehensive MRSA colonization surveillance program.  It is not intended to diagnose MRSA infection nor to guide or monitor treatment for MRSA infections. RESULT CALLED TO, READ BACK BY AND VERIFIED WITH: I.NHOUYVANISVONG,RN AT 0121 BY L.PITT 09/03/18       Radiology Studies: No results found.   Scheduled Meds: .  stroke: mapping our early stages of recovery book   Does not apply Once  . Chlorhexidine Gluconate Cloth  6 each Topical Q0600  . docusate sodium  100 mg Oral BID  . hydrocerin  1 application Topical Daily  . Influenza vac split quadrivalent PF  0.5 mL Intramuscular Tomorrow-1000  . mupirocin ointment  1 application Nasal BID  . nystatin   Topical TID  . pantoprazole  40 mg Oral Daily   Or  . pantoprazole sodium  40 mg Per Tube Daily   Continuous Infusions: . sodium chloride 100 mL/hr at 09/05/18 1317  . levETIRAcetam 500 mg (09/06/18 0520)     LOS: 4 days   Time Spent in minutes   20 minutes  Rosamary Boudreau D.O. on 09/06/2018 at 12:35 PM  Between 7am to 7pm - Please see pager noted on [email or web address]  After 7pm go to [email or web address]  And look for the night coverage person covering for me after hours  [facility] Office  [phone]

## 2018-09-06 NOTE — Progress Notes (Signed)
                                                                                                                                                                                                          Daily Progress Note   Patient Name: [name]       Date: 09/06/2018 DOB: 11/27/1923  Age: 84 y.o. MRN#: [id number] Attending Physician: Mikhail, Maryann, DO Primary Care Physician: Smith, Candace, MD Admit Date: 09/02/2018  Reason for Consultation/Follow-up: Establishing goals of care  Subjective: [name] is agitated and confused. Unable to have conversation.   Length of Stay: 4  Current Medications: Scheduled Meds:  .  stroke: mapping our early stages of recovery book   Does not apply Once  . Chlorhexidine Gluconate Cloth  6 each Topical Q0600  . docusate sodium  100 mg Oral BID  . hydrocerin  1 application Topical Daily  . Influenza vac split quadrivalent PF  0.5 mL Intramuscular Tomorrow-1000  . mupirocin ointment  1 application Nasal BID  . nystatin   Topical TID  . pantoprazole  40 mg Oral Daily   Or  . pantoprazole sodium  40 mg Per Tube Daily    Continuous Infusions: . sodium chloride 100 mL/hr at 09/05/18 1317  . levETIRAcetam 500 mg (09/06/18 0520)    PRN Meds: acetaminophen **OR** acetaminophen (TYLENOL) oral liquid 160 mg/5 mL **OR** acetaminophen, morphine injection, OLANZapine zydis, ondansetron **OR** ondansetron (ZOFRAN) IV  Physical Exam  Constitutional: [name] appears well-developed. [name] has a sickly appearance. [name] appears ill.  Frail, elderly  HENT:  Head: Normocephalic and atraumatic.  Cardiovascular: Normal rate.  Pulmonary/Chest: Effort normal. No accessory muscle usage. No tachypnea. No respiratory distress.  Abdominal: Normal appearance.  Neurological: [name] is alert. [name] is disoriented.  Vitals reviewed.           Vital Signs: BP (!) 157/90 (BP Location: Right Arm)   Kelley 90   Temp 97.7 F (36.5 C) (Oral)   Resp 19   SpO2 99%    SpO2: SpO2: 99 % O2 Device: O2 Device: Room Air O2 Flow Rate:    Intake/output summary:   Intake/Output Summary (Last 24 hours) at 09/06/2018 1251 Last data filed at 09/06/2018 1145 Gross per 24 hour  Intake 2036.82 ml  Output -  Net 2036.82 ml   LBM: Last BM Date: 09/05/18 Baseline Weight:   Most recent weight:         Palliative Assessment/Data: 30%     Patient Active Problem List   Diagnosis Date Noted  .   Subarachnoid hematoma ([place]) 09/02/2018  . Pressure injury of skin 08/20/2018  . Critical aortic valve stenosis 08/20/2018  . Dementia with behavioral disturbance ([place]) 08/20/2018  . Obesity (BMI 30.0-34.9) 08/20/2018  . UTI (urinary tract infection) 08/18/2018  . Deep vein thrombosis (DVT) of distal vein of right lower extremity ([place]) 12/26/2015  . Generalized weakness 12/26/2015  . Pulmonary embolism without acute cor pulmonale ([place]) 12/26/2015  . Humerus fracture 12/18/2015  . Pulmonary embolism ([place]) 12/18/2015  . Acute encephalopathy   . Fracture of humerus, left, closed 12/17/2015  . SAH (subarachnoid hemorrhage) ([place]) 11/16/2015  . Diastolic dysfunction-grade 2 11/16/2015  . Wrist fracture 11/14/2015  . Fall 11/14/2015  . Essential hypertension   . Hypercholesteremia   . Allergic rhinitis   . Arthritis   . Dyslipidemia   . Obesity     Palliative Care Assessment & Plan   HPI: [name] a 84 y.o.femalewith a medical history ofdementia, hypertension, hyperlipidemia, critical aortic stenosis, history of DVT/PE on Eliquis, presented to the emergency department after falling at SNF. [name] had an unwitnessed fall out of her wheelchair. [name] is noted to have a subarachnoid hemorrhage with other small areas of bleeding on CT head.   Assessment: I met today with [name]' who is confused and restless. However, [name] does communicate with me and tells me "my heart hurts." When I question her further [name] consistently denies pain but also continues to  tell me that her heart hurts but then talks vaguely about being "disappointed" but unable to provide any further details. RN reports that patient has been agitated and distressed by visits from her daughters and the fact they do not get along and complain of each other to patient - per report.   I called and spoke with daughter, [name]. [name] reports that [name] has placed call to attorney to obtain HCPOA documents that [name] believes does exist. [name] sounds unwell over the phone and complains of migraine and was hoping to meet with me this afternoon but requesting to meet tomorrow 09/07/18 at 1000 am. I will further discuss options with [name] about options for EOL care including hospice options. CSW has reported that return to SNF with hospice is an option. I also believe that [name] would be eligible for hospice facility as well.   Recommendations/Plan:  Appears from notes that plan is moving towards comfort care.   Will meet with daughter, [name], to discuss [place] and options. [name] believes [name] is HCPOA. I will not call other daughter, [name], at this time as [name] has been verbally abusive to my colleague. IF HCPOA is unclear I will consider speaking with [name] but will not continue in a conversation in which [name] is verbally abusive.   SLP eval. Unclear goals and patient continues NPO since 09/02/18.   Goals of Care and Additional Recommendations:  Limitations on Scope of Treatment: TBD  Code Status:  DNR  Prognosis:   < 2 weeks  Discharge Planning:  To Be Determined  Thank you for allowing the Palliative Medicine Team to assist in the care of this patient.   Total Time 25 min Prolonged Time Billed  no       Greater than 50%  of this time was spent counseling and coordinating care related to the above assessment and plan.  Karen Sill, NP Palliative Medicine Team Pager # [phone] (M-F 8a-5p) Team Phone # [phone] (Nights/Weekends)

## 2018-09-07 ENCOUNTER — Inpatient Hospital Stay (HOSPITAL_COMMUNITY): Payer: Medicare Other

## 2018-09-07 DIAGNOSIS — Z515 Encounter for palliative care: Secondary | ICD-10-CM

## 2018-09-07 DIAGNOSIS — I350 Nonrheumatic aortic (valve) stenosis: Secondary | ICD-10-CM

## 2018-09-07 DIAGNOSIS — Z7189 Other specified counseling: Secondary | ICD-10-CM

## 2018-09-07 DIAGNOSIS — W19XXXD Unspecified fall, subsequent encounter: Secondary | ICD-10-CM

## 2018-09-07 MED ORDER — MORPHINE SULFATE (PF) 2 MG/ML IV SOLN
2.0000 mg | INTRAVENOUS | Status: DC | PRN
  Administered 2018-09-08: 2 mg via INTRAVENOUS
  Filled 2018-09-07: qty 1

## 2018-09-07 NOTE — Progress Notes (Signed)
PROGRESS NOTE    [name]  WJX:[id number] DOB: 1924-05-11 DOA: 09/02/2018 PCP: Merri BrunetteSmith, Candace, MD   Brief Narrative:  HPI on 09/02/2018  Karen DessertColleen M Kelley is a 84 y.o. female with a medical history of dementia, hypertension, hyperlipidemia, history of DVT/PE on Eliquis, presented to the emergency department [facility].  Patient is unable to provide history, some obtained from daughter, some obtained from EDP.The patient was brought in by EMS.  It seems that she had an unwitnessed fall out of her wheelchair today.  Patient did sustain some head trauma but unclear if she lost consciousness.  She does have several lacerations on her face.  It was recently hospitalized 08/18/2018 through 08/24/2018 and found to have urinary tract infection and cellulitis.  She was discharged to rehab.  Patient's daughter states that she has been taking care of her mother for the past 3 years but has been requiring assistance with ambulation as well as daily activities.  Interim history [facility] Pending further recommendations from family.   Assessment & Plan   Subarachnoid hemorrhage -status post fall, in the setting of Eliquis -Seems that patient fell out of her wheelchair earlier today -CT head showed acute hemorrhage in the paramedian parietal lobes bilaterally measuring up to 2.4 cm, 4 cc with small volume of subarachnoid hemorrhage along the dorsal falx.  Mild local mass-effect without herniation.  Several additional tiny acute hemorrhage scattered throughout the brain and cortical distribution somewhat atypical for trauma and may represent microhemorrhage related to amyloid disease, hypertension or metastasis. -CT maxillofacial unremarkable for displaced facial fracture.   -CT cervical spine no acute fracture dislocation. -Patient currently DNR.  Discussed with her daughter at bedside comfort care measures and goals of care.  Daughter agrees to palliative care consult but  is not ready to make this decision. -Placed on IV morphine every 4 hours as needed fior pain control -Continue neuro checks -Continue NPO, speech therapy consulted- recommended instrumental testin -Given the patient is on Eliquis, received Kcentra for reversal -Neurosurgery consulted and appreciated, repeat imaging tomorrow and Keppra 500 mg twice daily x7 days for seizure prophylaxis. -CT today shows worsening posttraumatic sequela of closed head injury with increased bilateral parietal hemorrhages now greater than 2 cm, new 1 cm right occipital parenchymal hemorrhage and worsening occipital subarachnoid blood.  Dementia -Stable, continue to hold Namenda  Essential hypertension -Hold Lasix and metoprolol.  If needed will place on IV hydralazine -BP stable  Hyperlipidemia -Hold Vytorin  Critical aortic stenosis -Upon last admission, patient was to follow with outpatient cardiology -Given her age and fragility, she likely would not be a candidate for intervention at this point  History of DVT/PE -As above patient was on Eliquis, now will be given Kcentra  Normocytic Anemia/Acute blood loss anemia -Hemoglobin 11.9- stable -Continue to monitor CBC  Goals of care -Discussed with patient's daughter at bedside, feel the patient has poor prognosis overall.  Patient currently is DNR however discussed possibly moving more towards comfort measures.  Patient's daughter does want her to be comfortable however not ready to make this decision.    Placed on IV morphine every 4 hours for pain.   -Palliative care consulted and appreciated -Patient was given Haldol without any improvement in her agitation.  She currently appears to be an active pain. -Given Zyprexa -Discussion with patient's daughter, [name], on 09/05/2018.  He states that she would like for her mother to be comfortable and possibly return home with her if  possible.  She also states that she has healthcare power of attorney and  will contact the attorney's office on Monday, today, 09/06/2018, to obtain that paperwork.  DVT Prophylaxis SCDs  Code Status: DNR  Family Communication: None at bedside. Discussed with Karen Dresser 09/05/2018  Disposition Plan: Admitted. Pending further decisions from family. Dispo TBD  Consultants Neurosurgery Palliative care  Procedures  None  Antibiotics   Anti-infectives (From admission, onward)   None      Subjective:   [name] seen and examined today.  Patient with dementia. Currently resting comfortably.   Objective:   Vitals:   09/06/18 1946 09/06/18 2315 09/07/18 0310 09/07/18 0724  BP: 117/87 (!) 121/49 (!) 132/53 (!) 106/51  Pulse: 83 81 88 79  Resp: 20 20 20 16   Temp: 98 F (36.7 C) 98.5 F (36.9 C) 98.5 F (36.9 C) 97.8 F (36.6 C)  TempSrc: Oral Oral Oral Oral  SpO2: 100% 94% 100% 95%   No intake or output data in the 24 hours ending 09/07/18 1205 There were no vitals filed for this visit.  Exam  General: Well developed, elderly, chronically ill appearing, NAD  HEENT: NCAT, mucous membranes moist.   Cardiovascular: S1 S2 auscultated, RRR.  Respiratory: Clear to auscultation bilaterally with equal chest rise  Abdomen: Soft, nontender, nondistended, + bowel sounds  Extremities: warm dry without cyanosis clubbing.  Lower extremity edema with venous stasis skin changes  Neuro: AAOx1, nonfocal  Data Reviewed: I have personally reviewed following labs and imaging studies  CBC: Recent Labs  Lab 09/02/18 1431 09/02/18 2044 09/03/18 0347 09/04/18 0439 09/05/18 0437  WBC 9.6 10.3 9.4 9.2  --   NEUTROABS 5.6  --   --   --   --   HGB 12.7 11.4* 10.6* 11.7* 11.9*  HCT 42.4 38.9 35.2* 39.3 39.6  MCV 94.0 92.8 91.7 91.0  --   PLT 281 278 248 261  --    Basic Metabolic Panel: Recent Labs  Lab 09/02/18 1431 09/03/18 0347  NA 139 140  K 4.0 3.9  CL 103 106  CO2 27 24  GLUCOSE 158* 99  BUN 22 15  CREATININE 0.85 0.73  CALCIUM 8.8*  8.3*   GFR: CrCl cannot be calculated (Unknown ideal weight.). Liver Function Tests: No results for input(s): AST, ALT, ALKPHOS, BILITOT, PROT, ALBUMIN in the last 168 hours. No results for input(s): LIPASE, AMYLASE in the last 168 hours. No results for input(s): AMMONIA in the last 168 hours. Coagulation Profile: Recent Labs  Lab 09/02/18 2044  INR 1.15   Cardiac Enzymes: No results for input(s): CKTOTAL, CKMB, CKMBINDEX, TROPONINI in the last 168 hours. BNP (last 3 results) No results for input(s): PROBNP in the last 8760 hours. HbA1C: No results for input(s): HGBA1C in the last 72 hours. CBG: No results for input(s): GLUCAP in the last 168 hours. Lipid Profile: No results for input(s): CHOL, HDL, LDLCALC, TRIG, CHOLHDL, LDLDIRECT in the last 72 hours. Thyroid Function Tests: No results for input(s): TSH, T4TOTAL, FREET4, T3FREE, THYROIDAB in the last 72 hours. Anemia Panel: No results for input(s): VITAMINB12, FOLATE, FERRITIN, TIBC, IRON, RETICCTPCT in the last 72 hours. Urine analysis:    Component Value Date/Time   COLORURINE YELLOW 09/03/2018 0359   APPEARANCEUR CLEAR 09/03/2018 0359   LABSPEC 1.017 09/03/2018 0359   PHURINE 7.0 09/03/2018 0359   GLUCOSEU NEGATIVE 09/03/2018 0359   HGBUR NEGATIVE 09/03/2018 0359   BILIRUBINUR NEGATIVE 09/03/2018 0359   KETONESUR 5 (A) 09/03/2018 0359  PROTEINUR NEGATIVE 09/03/2018 0359   NITRITE NEGATIVE 09/03/2018 0359   LEUKOCYTESUR NEGATIVE 09/03/2018 0359   Sepsis Labs: @LABRCNTIP (procalcitonin:4,lacticidven:4)  ) Recent Results (from the past 240 hour(s))  MRSA PCR Screening     Status: Abnormal   Collection Time: 09/02/18 10:25 PM  Result Value Ref Range Status   MRSA by PCR POSITIVE (A) NEGATIVE Final    Comment:        The GeneXpert MRSA Assay (FDA approved for NASAL specimens only), is one component of a comprehensive MRSA colonization surveillance program. It is not intended to diagnose MRSA infection nor  to guide or monitor treatment for MRSA infections. RESULT CALLED TO, READ BACK BY AND VERIFIED WITH: I.NHOUYVANISVONG,RN AT 0121 BY L.PITT 09/03/18       Radiology Studies: No results found.   Scheduled Meds: .  stroke: mapping our early stages of recovery book   Does not apply Once  . docusate sodium  100 mg Oral BID  . hydrocerin  1 application Topical Daily  . Influenza vac split quadrivalent PF  0.5 mL Intramuscular Tomorrow-1000  . mupirocin ointment  1 application Nasal BID  . nystatin   Topical TID  . pantoprazole  40 mg Oral Daily   Or  . pantoprazole sodium  40 mg Per Tube Daily   Continuous Infusions: . sodium chloride 100 mL/hr at 09/07/18 0022  . levETIRAcetam 500 mg (09/07/18 0422)     LOS: 5 days   Time Spent in minutes   20 minutes  Alysia Scism D.O. on 09/07/2018 at 12:05 PM  Between 7am to 7pm - Please see pager noted on [email or web address]  After 7pm go to [email or web address]  And look for the night coverage person covering for me after hours  [facility] Office  [phone]

## 2018-09-07 NOTE — Progress Notes (Signed)
CM spoke to [place] (daughter) over the phone. [facility] is [facility]. CM updated CSW. CM signing off.

## 2018-09-07 NOTE — Progress Notes (Signed)
[facility]  Received request from CSW for family interest in [facility].  Chart reviewed and spoke with family to acknowledge referral.  Unfortunately [facility] does not have a bed to offer today.  Family and CSW are aware [facility] hospital liaison will follow up with CSW and family should a bed become available.    Please call with any hospice related questions or concerns.  Wallis BambergJennifer Woody BSN, RN [facility] Hospital Liaison (listed in [place]) [phone]

## 2018-09-07 NOTE — Evaluation (Signed)
Clinical/Bedside Swallow Evaluation Patient Details  Name: [name] MRN: [id number] Date of Birth: Jul 20, 1924  Today's Date: 09/07/2018 Time: SLP Start Time (ACUTE ONLY): 0915 SLP Stop Time (ACUTE ONLY): 0932 SLP Time Calculation (min) (ACUTE ONLY): 17 min  Past Medical History:  Past Medical History:  Diagnosis Date  . Allergic rhinitis   . Anxiety   . Aortic valve disorders   . Arthritis    "all over" (09/02/2018)  . Chronic bronchitis (HCC)   . Dementia (HCC)    "early stages" (09/02/2018)  . Dyslipidemia   . Fall at nursing home 09/02/2018  . Frequent UTI   . Glaucoma   . Heart murmur   . Hypercholesteremia   . Hypertension   . Memory loss   . Obesity   . Vertigo    Past Surgical History:  Past Surgical History:  Procedure Laterality Date  . APPENDECTOMY    . CARDIAC CATHETERIZATION     normal coronary arteries  . CATARACT EXTRACTION W/ INTRAOCULAR LENS  IMPLANT, BILATERAL Bilateral    Dr. Darel HongBeavis  . JOINT REPLACEMENT    . SHOULDER ARTHROSCOPY W/ ROTATOR CUFF REPAIR N/A   . TONSILLECTOMY    . TOTAL KNEE ARTHROPLASTY Bilateral    HPI:  [name] is a 84 y.o. female with a medical history of dementia, hypertension, hyperlipidemia, critical aortic stenosis, history of DVT/PE on Eliquis, [facility] She had an unwitnessed fall out of her wheelchair. She is noted to have a subarachnoid hemorrhage with other small areas of bleeding on CT head. Plan is moving towards comfort care per nursing notes. Note that patient has complained of difficulty swallowing in the past. Bedside swallow evaluation complete in 2017 which indicated normal swallowing function.    Assessment / Plan / Recommendation Clinical Impression  Patient presents with signs of aspiration across consistencies trials, worse with thin liquids vs thicker consistencies. Recommend instrumental testing to determine safest diet. Note that patient is  alert and agreeable to plan.  SLP Visit Diagnosis: Dysphagia, unspecified (R13.10)    Aspiration Risk       Diet Recommendation NPO   Medication Administration: Via alternative means    Other  Recommendations Oral Care Recommendations: Oral care QID             Prognosis Prognosis for Safe Diet Advancement: Good      Swallow Study   General HPI: [name] is a 84 y.o. female with a medical history of dementia, hypertension, hyperlipidemia, critical aortic stenosis, history of DVT/PE on Eliquis, [facility] She had an unwitnessed fall out of her wheelchair. She is noted to have a subarachnoid hemorrhage with other small areas of bleeding on CT head. Plan is moving towards comfort care per nursing notes. Note that patient has complained of difficulty swallowing in the past. Bedside swallow evaluation complete in 2017 which indicated normal swallowing function.  Type of Study: Bedside Swallow Evaluation Previous Swallow Assessment: see HPI Diet Prior to this Study: NPO Temperature Spikes Noted: No Respiratory Status: Room air History of Recent Intubation: No Behavior/Cognition: Alert;Cooperative;Pleasant mood Oral Cavity Assessment: Within Functional Limits Oral Care Completed by SLP: Recent completion by staff Oral Cavity - Dentition: Adequate natural dentition Vision: Functional for self-feeding Self-Feeding Abilities: Needs assist Patient Positioning: Upright in bed Baseline Vocal Quality: Low vocal intensity Volitional Cough: Weak Volitional Swallow: Able to elicit    Oral/Motor/Sensory Function Overall Oral Motor/Sensory  Function: Within functional limits   Ice Chips Ice chips: Within functional limits Presentation: Spoon   Thin Liquid Thin Liquid: Impaired Presentation: Cup;Straw Pharyngeal  Phase Impairments: Cough - Immediate    Nectar Thick Nectar Thick Liquid: Impaired Presentation: Spoon Pharyngeal Phase  Impairments: Throat Clearing - Immediate;Wet Vocal Quality   Honey Thick Honey Thick Liquid: Not tested   Puree Puree: Impaired Pharyngeal Phase Impairments: Wet Vocal Quality;Throat Clearing - Delayed   Solid     Solid: Not tested     Urbano Milhouse MA, CCC-SLP   Latesia Norrington Meryl 09/07/2018,10:31 AM

## 2018-09-07 NOTE — Progress Notes (Signed)
Chaplain responded to spiritual care consult. Pt was not in room. Will visit at another time. Karen ChadVirginia Karsynn Kelley [phone]

## 2018-09-07 NOTE — Care Management Note (Signed)
Case Management Note  Patient Details  Name: [name] MRN: [id number] Date of Birth: June 28, 1924  Subjective/Objective:   Pt admitted with subarachnoid hemorrhage. [facility]                 Action/Plan: Awaiting palliative to meet with daughter to determine d/c disposition. CM following.  Expected Discharge Date:                  Expected Discharge Plan:     In-House Referral:     Discharge planning Services     Post Acute Care Choice:    Choice offered to:     DME Arranged:    DME Agency:     HH Arranged:    HH Agency:     Status of Service:  In process, will continue to follow  If discussed at Long Length of Stay Meetings, dates discussed:    Additional Comments:  Kermit BaloKelli F Chaundra Abreu, RN 09/07/2018, 12:30 PM

## 2018-09-07 NOTE — Progress Notes (Signed)
Daily Progress Note   Patient Name: [name]       Date: 09/07/2018 DOB: [birth year]  Age: 84 y.o. MRN#: [id number] Attending Physician: Edsel Petrin, DO Primary Care Physician: Merri Brunette, MD Admit Date: 09/02/2018  Reason for Consultation/Follow-up: Establishing goals of care, Non pain symptom management, Pain control and Psychosocial/spiritual support  Subjective: Talked with [name] at length on the phone.  She has been to the Attorney's office and determined that she is the HCPOA.  Today [name] has developed a nose bleed that will not stop and she is unable to come to the hospital.  We discussed the swallow evaluation and the potential for aspiration with every food texture and consistency.  [name] is in support of her mother receiving comfort feeds as long as they do not cause her to strangle or be uncomfortable.  I explained to [name] that recurrent aspiration is a terminal condition.  I commented that her mother is in the process of dying.  [name] asked how I could tell & I explained the signs.  We discussed a transfer to hospice house for specialized end of life care.  [name] is in agreement with transfer to [facility] but prefers that it be done tomorrow or after so that she can be present when her mother arrives in the new place.  Assessment: Patient with SAH.  Lethargic, aspirating without a desire to eat or drink.  Family wants to focus on her comfort.   Patient Profile/HPI:  [name] a 84 y.o.femalewith a medical history ofdementia, hypertension, hyperlipidemia, history of DVT/PE on Eliquis, presented to the emergency department after falling at [facility]. Patient is unable to provide history, some obtained from daughter, some obtained from  EDP.The patient was brought in by EMS. It seems that she had an unwitnessed fall out of her wheelchair. She is noted to have a subarachnoid hemorrhage with other small areas of bleeding.   Length of Stay: 5  Current Medications: Scheduled Meds:  .  stroke: mapping our early stages of recovery book   Does not apply Once  . docusate sodium  100 mg Oral BID  . hydrocerin  1 application Topical Daily  . Influenza vac split quadrivalent PF  0.5 mL Intramuscular Tomorrow-1000  . mupirocin ointment  1 application Nasal BID  . nystatin  Topical TID  . pantoprazole  40 mg Oral Daily   Or  . pantoprazole sodium  40 mg Per Tube Daily    Continuous Infusions: . sodium chloride 100 mL/hr at 09/07/18 0022  . levETIRAcetam 500 mg (09/07/18 0422)    PRN Meds: acetaminophen **OR** acetaminophen (TYLENOL) oral liquid 160 mg/5 mL **OR** acetaminophen, morphine injection, OLANZapine zydis, ondansetron **OR** ondansetron (ZOFRAN) IV  Physical Exam        Elderly female with dried blood on the right side of her head.  Sleeping comfortably.  She barely wakes to my touch and exam CV rrr with 3/6 systolic murmur Resp no distress, no w/c/r Abdomen soft, nt, nd Ext.  2+ edema with scabbing covering her leg below the knee  Vital Signs: BP (!) 106/51 (BP Location: Right Arm)   Pulse 79   Temp 97.8 F (36.6 C) (Oral)   Resp 16   SpO2 95%  SpO2: SpO2: 95 % O2 Device: O2 Device: Room Air O2 Flow Rate:    Intake/output summary: No intake or output data in the 24 hours ending 09/07/18 1332 LBM: Last BM Date: 09/05/18 Baseline Weight:   Most recent weight:         Palliative Assessment/Data: 10%      Patient Active Problem List   Diagnosis Date Noted  . Goals of care, counseling/discussion   . Palliative care encounter   . Subarachnoid hematoma (HCC) 09/02/2018  . Pressure injury of skin 08/20/2018  . Critical aortic valve stenosis 08/20/2018  . Dementia with behavioral disturbance  (HCC) 08/20/2018  . Obesity (BMI 30.0-34.9) 08/20/2018  . UTI (urinary tract infection) 08/18/2018  . Deep vein thrombosis (DVT) of distal vein of right lower extremity (HCC) 12/26/2015  . Generalized weakness 12/26/2015  . Pulmonary embolism without acute cor pulmonale (HCC) 12/26/2015  . Humerus fracture 12/18/2015  . Pulmonary embolism (HCC) 12/18/2015  . Acute encephalopathy   . Fracture of humerus, left, closed 12/17/2015  . SAH (subarachnoid hemorrhage) (HCC) 11/16/2015  . Diastolic dysfunction-grade 2 11/16/2015  . Wrist fracture 11/14/2015  . Fall 11/14/2015  . Essential hypertension   . Hypercholesteremia   . Allergic rhinitis   . Arthritis   . Dyslipidemia   . Obesity     Palliative Care Plan    Recommendations/Plan:  D/C all interventions not focused on comfort.  Shift to full comfort care  Offer comfort feeds from floor stock with aspiration precautions.  Family accepts the risk of aspiration & wants their mother comfortable  [facility] or when [facility] is available.  Goals of Care and Additional Recommendations:  Limitations on Scope of Treatment: Full Comfort Care  Code Status:  DNR  Prognosis:   < 2 weeks Advanced heart failure with critical aortic stenosis, now with SAH, lethargic, with no desire to eat or drink.   Discharge Planning:  Hospice facility  Care plan was discussed with social worker and daughter Karen DresserConnie [facility](HCPOA)  Thank you for allowing the Palliative Medicine Team to assist in the care of this patient.  Total time spent:  35 min.     Greater than 50%  of this time was spent counseling and coordinating care related to the above assessment and plan.  Norvel RichardsMarianne Kesha Hurrell, PA-C Palliative Medicine  Please contact Palliative MedicineTeam phone at [phone] for questions and concerns between 7 am - 7 pm.   Please see AMION for individual provider pager numbers.

## 2018-09-08 MED ORDER — OLANZAPINE 5 MG PO TBDP: 5.0000 mg | ORAL_TABLET | Freq: Two times a day (BID) | ORAL | 0 refills | Status: AC | PRN

## 2018-09-08 MED ORDER — MORPHINE SULFATE (CONCENTRATE) 10 MG /0.5 ML PO SOLN: 10.0000 mg | ORAL | 0 refills | Status: AC | PRN

## 2018-09-08 MED ORDER — LORAZEPAM 0.5 MG PO TABS: 0.5000 mg | ORAL_TABLET | Freq: Two times a day (BID) | ORAL | 0 refills | Status: AC

## 2018-09-08 NOTE — Progress Notes (Addendum)
[facility]  [facility] room is available today for patient. Spoke with her HCPOA/daughter [name] by phone to explain services and confirm interest. [name] is agreeable to transfer today but she is not feeling well and made arrangements for her spouse Onalee HuaDavid to complete paperwork [facility] noon. Paperwork has been complete. Message sent to CSW Marisue IvanLiz making her aware. [facility].   RN please call report to [facility] at [phone].   Forrestine Himva Davis, LCSW [phone]

## 2018-09-08 NOTE — Progress Notes (Signed)
CSW following for discharge plan. Patient recent readmit, last full assessment completed on 08/20/18 (see below). Patient now comfort care, family requesting [facility]. CSW called and spoke with daughter about setting up transportation.  [name], [place] Clinical Social Worker [phone]     Clinical Social Work Assessment  Patient Details  Name: [name] MRN: [id number] Date of Birth: April 03, 1924  Date of referral:  08/20/18               Reason for consult:  Facility Placement, Discharge Planning                    Permission sought to share information with:  Facility Medical sales representativeContact Representative, Family Supports Permission granted to share information::                Name::     Armando GangConnie Evans             Agency::  SNF's             Relationship::  Daughter             Contact Information:  [phone]  Housing/Transportation Living arrangements for the past 2 months:  Single Family Home Source of Information:  Medical Team, Adult Children Patient Interpreter Needed:  None Criminal Activity/Legal Involvement Pertinent to Current Situation/Hospitalization:  No - Comment as needed Significant Relationships:  Adult Children Lives with:  Adult Children Do you feel safe going back to the place where you live?  Yes Need for family participation in patient care:  Yes (Comment)  Care giving concerns:  PT recommending SNF once medically stable for discharge.   Social Worker assessment / plan:  Patient not fully oriented. No supports at bedside. CSW called patient's daughter, introduced role, and explained that PT recommendations would be discussed. Patient's daughter is agreeable to SNF placement. No facility preference. [facility] after a [facility]. She has been living with her daughter and son-in-law ever since. Patient's daughter does not want her to return to [name]. No further concerns. CSW encouraged  patient's daughter to contact CSW as needed. CSW will continue to follow patient and her daughter for support and facilitate discharge to SNF once medically stable.  Employment status:  Retired Database administratornsurance information:  Managed Medicare PT Recommendations:  Skilled Nursing Facility Information / Referral to community resources:  Skilled Nursing Facility  Patient/Family's Response to care:  Patient not fully oriented. Patient's daughter agreeable to SNF placement. Patient's daughter and son-in-law supportive and involved in patient's care. Patient's daughter appreciated social work intervention.  Patient/Family's Understanding of and Emotional Response to Diagnosis, Current Treatment, and Prognosis:  Patient not fully oriented. Patient's daughter has a good understanding of the reason for admission and her need for rehab prior to returning home. Patient's daughter appears happy with hospital care.  Emotional Assessment Appearance:  Appears stated age Attitude/Demeanor/Rapport:  Unable to Assess Affect (typically observed):  Unable to Assess Orientation:  Oriented to Self, Oriented to Place Alcohol / Substance use:  Never Used Psych involvement (Current and /or in the community):  No (Comment)  Discharge Needs  Concerns to be addressed:  Care Coordination Readmission within the last 30 days:  No Current discharge risk:  Dependent with Mobility Barriers to Discharge:  Continued Medical Work up, Insurance Authorization   Margarito LinerSarah C Boswell, LCSW 08/20/2018, 12:30 PM

## 2018-09-08 NOTE — Progress Notes (Signed)
[facility]. [facility] for transport. All belongings and discharge paperwork sent with patient. IV taken out. Nurse [facility].

## 2018-09-08 NOTE — Discharge Summary (Signed)
Physician Discharge Summary  [name]:[id number] DOB: 01-Jun-1924 DOA: 09/02/2018  PCP: Merri Brunette, MD  Admit date: 09/02/2018 Discharge date: 09/08/2018  Time spent: 30 minutes  Recommendations for Outpatient Follow-up:  1. Patient will be going to hospice facility for end-of-life care 2. Prescriptions for morphine as well as Zydis given 3. Holding anticoagulation rate control admitted  Discharge Diagnoses:  Active Problems:   Dyslipidemia   Essential hypertension   [facility] (subarachnoid hemorrhage) (HCC)   Pulmonary embolism (HCC)   Deep vein thrombosis (DVT) of distal vein of right lower extremity (HCC)   Critical aortic valve stenosis   Subarachnoid hematoma (HCC)   Goals of care, counseling/discussion   Palliative care encounter   Comfort measures only status   Encounter for hospice care discussion   Discharge Condition: Guarded  Diet recommendation: Comfort related feeds  Filed Weights   09/07/18 1605  Weight: 73.6 kg    History of present illness:  84 year old female with prior moderate to severe dementia HTN HLD prior DVT PE on Eliquis brought in with small on the 11/14 and found to have a paramedian parietal intracranial hemorrhage with mild local mass-effect and other areas having the same-patient was on Eliquis and this was discontinued and Kcentra was given neurosurgery saw the patient and place patient on Keppra It was felt that patient's prognosis is poor and palliative care saw the patient in consult-she was intermittently agitated in the hospital and was given medication for pain and for anxiety Ultimately discussion was pursued regarding hospice and patient was placed at freestanding hospice facility on discharge    Consultations:  Neurosurgery  Palliative care  Discharge Exam: Vitals:   09/08/18 0720 09/08/18 0900  BP: (!) 156/67 133/82  Pulse: 89 79  Resp: 16 16  Temp: (!) 97.4 F (36.3 C) (!) 97.5 F (36.4 C)  SpO2: (!)  83%     General: Awake confused no distress bruising to the right temple Cardiovascular: S1-S2 no murmur gallop Respiratory: Clinically clear no added sound Able to lift left arm at her than right arm she does have bruising all over She has hyperkeratinization of her lower extremities and has 5 pound 5 power but is weak overall  Discharge Instructions   Discharge Instructions    Diet - low sodium heart healthy   Complete by:  As directed    Increase activity slowly   Complete by:  As directed      Allergies as of 09/08/2018   No Known Allergies     Medication List    STOP taking these medications   apixaban 5 MG Tabs tablet Commonly known as:  ELIQUIS   atorvastatin 40 MG tablet Commonly known as:  LIPITOR   feeding supplement (PRO-STAT SUGAR FREE 64) Liqd   furosemide 20 MG tablet Commonly known as:  LASIX   memantine 10 MG tablet Commonly known as:  NAMENDA   metoprolol succinate 25 MG 24 hr tablet Commonly known as:  TOPROL-XL   metoprolol tartrate 25 MG tablet Commonly known as:  LOPRESSOR   multivitamin with minerals Tabs tablet   polyethylene glycol packet Commonly known as:  MIRALAX / GLYCOLAX   sertraline 25 MG tablet Commonly known as:  ZOLOFT   traMADol 50 MG tablet Commonly known as:  ULTRAM   trimethoprim-polymyxin b ophthalmic solution Commonly known as:  POLYTRIM     TAKE these medications   hydrocerin Crea Apply 1 application topically daily. Apply Eucerin cream to bilat legs Q  day after bathing and roughly towel drying to assist with removal of loose skin What changed:    when to take this  additional instructions   LORazepam 0.5 MG tablet Commonly known as:  ATIVAN Take 1 tablet (0.5 mg total) by mouth every 12 (twelve) hours.   morphine CONCENTRATE 10 mg / 0.5 ml concentrated solution Place 0.5 mLs (10 mg total) under the tongue every 3 (three) hours as needed for severe pain.   nystatin powder Commonly known as:   MYCOSTATIN/NYSTOP Apply topically 3 (three) times daily. Apply to affected areas for cutaneous candidiasis What changed:    when to take this  additional instructions   OLANZapine zydis 5 MG disintegrating tablet Commonly known as:  ZYPREXA Take 1 tablet (5 mg total) by mouth 2 (two) times daily as needed (anxiety).   OXYGEN Inhale 2 L into the lungs continuous.      No Known Allergies    The results of significant diagnostics from this hospitalization (including imaging, microbiology, ancillary and laboratory) are listed below for reference.    Significant Diagnostic Studies: Ct Head Wo Contrast  Result Date: 09/03/2018 CLINICAL DATA:  Unwitnessed fall. Patient presumably fell from wheelchair. Parenchymal hemorrhages demonstrated on yesterday's exam. History of dementia. EXAM: CT HEAD WITHOUT CONTRAST TECHNIQUE: Contiguous axial images were obtained from the base of the skull through the vertex without intravenous contrast. COMPARISON:  09/02/2018. FINDINGS: The patient was unable to remain motionless for the exam. Small or subtle lesions could be overlooked. Brain: There is worsening of the BILATERAL parietal hemorrhages, demonstrated on yesterday's exam. These measure 22 x 24 mm on the LEFT and 21 x 25 mm on the RIGHT. Slight worsening edema surrounding both. New RIGHT occipital parenchymal hemorrhage, 7 x 11 mm also consistent with a posttraumatic shearing injury. Worsening subarachnoid blood involving the calcarine fissures. RIGHT frontal parenchymal contusion, 5 x 10 mm, stable. Motion degraded exam precludes evaluation of the other small bleeds, which are probably unchanged. Baseline atrophy with chronic microvascular ischemic change. Vascular: Calcification of the cavernous internal carotid arteries consistent with cerebrovascular atherosclerotic disease. No signs of intracranial large vessel occlusion. Skull: Calvarium intact. Sinuses/Orbits: No layering sinus fluid.  Grossly  negative orbits. Other: None. IMPRESSION: Worsening of posttraumatic sequelae of closed head injury, with increased BILATERAL parietal hemorrhages, now each greater than 2 cm, with a new 1 cm RIGHT occipital parenchymal hemorrhage, and worsening occipital subarachnoid blood. Continued surveillance is warranted. A call has been placed to the ordering provider. Electronically Signed   By: Elsie Stain M.D.   On: 09/03/2018 08:52   Ct Head Wo Contrast  Result Date: 09/02/2018 CLINICAL DATA:  84 y/o F; unwitnessed fall. Laceration on the forehead with bleeding control. EXAM: CT HEAD WITHOUT CONTRAST CT MAXILLOFACIAL WITHOUT CONTRAST CT CERVICAL SPINE WITHOUT CONTRAST TECHNIQUE: Multidetector CT imaging of the head, cervical spine, and maxillofacial structures were performed using the standard protocol without intravenous contrast. Multiplanar CT image reconstructions of the cervical spine and maxillofacial structures were also generated. COMPARISON:  08/18/2018 CT head. FINDINGS: CT HEAD FINDINGS Brain: Acute hemorrhage within the paramedian parietal lobes bilaterally with subarachnoid hemorrhage along the dorsal falx. Mild local mass effect without herniation. The larger left parietal focus of hemorrhage measures 2.0 x 1.6 x 2.4 cm (volume = 4 cm^3)(AP x ML x CC series 9, image 23 and series 11, image 56). There are several additional subcentimeter hyperdense foci scattered throughout the brain that are rounded and cortically based for example in the right  parietal lobe (series 9, image 27). No hydrocephalus, findings of stroke, mass effect, or herniation. Nonspecific white matter hypodensities are compatible with mild to moderate chronic microvascular ischemic changes and there is stable volume loss of the brain. Vascular: Calcific atherosclerosis of carotid siphons. No hyperdense vessel identified. Skull: Motion artifact, no grossly displaced calvarial fracture. Other: None. CT MAXILLOFACIAL FINDINGS Osseous:  Extensive motion artifact. No grossly displaced facial fracture identified. No mandibular dislocation. Orbits: Negative. No traumatic or inflammatory finding. Sinuses: Clear. Soft tissues: Negative. CT CERVICAL SPINE FINDINGS Alignment: C6-7, C7-T1, T1-2 grade 1 anterolisthesis is stable. Straightening of cervical lordosis. Skull base and vertebrae: No acute fracture. No primary bone lesion or focal pathologic process. Soft tissues and spinal canal: No prevertebral fluid or swelling. No visible canal hematoma. Disc levels: Advanced disc and facet degenerative changes greatest at the C4-5 level where prominent disc osteophyte complex results in at least moderate spinal canal stenosis. Multilevel facet effusion, likely on degenerative basis. Upper chest: Negative. Other: Calcific atherosclerosis of carotid bifurcations. IMPRESSION: CT head: 1. Motion degraded study.  No gross calvarial fracture. 2. Acute hemorrhages in the paramedian parietal lobes bilaterally measuring up to 2.4 cm, 4 cc with small volume of subarachnoid hemorrhage along dorsal falx. Mild local mass effect without herniation. 3. Several additional tiny acute hemorrhages scattered throughout the brain in cortical distribution are somewhat atypical for trauma and may represent microhemorrhage related to amyloid disease, hypertension, or possibly metastasis. When patient is able to hold still, consider MRI of the brain without and with contrast. 4. Stable mild to moderate chronic microvascular ischemic changes and volume loss of the brain for age. CT maxillofacial: 1. Motion artifact, no grossly displaced facial fracture identified. No mandibular dislocation. CT cervical spine: 1. No acute fracture or dislocation identified. 2. Stable advanced cervical spondylosis greatest at C4-5 level where there is at least moderate spinal canal stenosis. Critical Value/emergent results were called by telephone at the time of interpretation on 09/02/2018 at 3:36 pm  to Dr. Leonia Corona , who verbally acknowledged these results. Electronically Signed   By: Mitzi Hansen M.D.   On: 09/02/2018 15:40   Ct Head Wo Contrast  Result Date: 08/18/2018 CLINICAL DATA:  Status post fall, with injury to the head. Patient on Eliquis. EXAM: CT HEAD WITHOUT CONTRAST TECHNIQUE: Contiguous axial images were obtained from the base of the skull through the vertex without intravenous contrast. COMPARISON:  CT of the head performed 12/17/2015 FINDINGS: Brain: No evidence of acute infarction, hemorrhage, hydrocephalus, extra-axial collection or mass lesion / mass effect. Prominence of the ventricles and sulci reflects mild to moderate cortical volume loss. Scattered periventricular and subcortical white matter change likely reflects small vessel ischemic microangiopathy. The brainstem and fourth ventricle are within normal limits. The basal ganglia are unremarkable in appearance. The cerebral hemispheres demonstrate grossly normal gray-white differentiation. No mass effect or midline shift is seen. Vascular: No hyperdense vessel or unexpected calcification. Skull: There is no evidence of fracture; visualized osseous structures are unremarkable in appearance. Sinuses/Orbits: The orbits are within normal limits. The paranasal sinuses and mastoid air cells are well-aerated. Other: Soft tissue swelling is noted at the left occiput. IMPRESSION: 1. No evidence of traumatic intracranial injury or fracture. 2. Soft tissue swelling at the left occiput. 3. Mild to moderate cortical volume loss and scattered small vessel ischemic microangiopathy. Electronically Signed   By: Roanna Raider M.D.   On: 08/18/2018 13:40   Ct Cervical Spine Wo Contrast  Result Date: 09/02/2018 CLINICAL DATA:  84 y/o F; unwitnessed fall. Laceration on the forehead with bleeding control. EXAM: CT HEAD WITHOUT CONTRAST CT MAXILLOFACIAL WITHOUT CONTRAST CT CERVICAL SPINE WITHOUT CONTRAST TECHNIQUE: Multidetector  CT imaging of the head, cervical spine, and maxillofacial structures were performed using the standard protocol without intravenous contrast. Multiplanar CT image reconstructions of the cervical spine and maxillofacial structures were also generated. COMPARISON:  08/18/2018 CT head. FINDINGS: CT HEAD FINDINGS Brain: Acute hemorrhage within the paramedian parietal lobes bilaterally with subarachnoid hemorrhage along the dorsal falx. Mild local mass effect without herniation. The larger left parietal focus of hemorrhage measures 2.0 x 1.6 x 2.4 cm (volume = 4 cm^3)(AP x ML x CC series 9, image 23 and series 11, image 56). There are several additional subcentimeter hyperdense foci scattered throughout the brain that are rounded and cortically based for example in the right parietal lobe (series 9, image 27). No hydrocephalus, findings of stroke, mass effect, or herniation. Nonspecific white matter hypodensities are compatible with mild to moderate chronic microvascular ischemic changes and there is stable volume loss of the brain. Vascular: Calcific atherosclerosis of carotid siphons. No hyperdense vessel identified. Skull: Motion artifact, no grossly displaced calvarial fracture. Other: None. CT MAXILLOFACIAL FINDINGS Osseous: Extensive motion artifact. No grossly displaced facial fracture identified. No mandibular dislocation. Orbits: Negative. No traumatic or inflammatory finding. Sinuses: Clear. Soft tissues: Negative. CT CERVICAL SPINE FINDINGS Alignment: C6-7, C7-T1, T1-2 grade 1 anterolisthesis is stable. Straightening of cervical lordosis. Skull base and vertebrae: No acute fracture. No primary bone lesion or focal pathologic process. Soft tissues and spinal canal: No prevertebral fluid or swelling. No visible canal hematoma. Disc levels: Advanced disc and facet degenerative changes greatest at the C4-5 level where prominent disc osteophyte complex results in at least moderate spinal canal stenosis. Multilevel  facet effusion, likely on degenerative basis. Upper chest: Negative. Other: Calcific atherosclerosis of carotid bifurcations. IMPRESSION: CT head: 1. Motion degraded study.  No gross calvarial fracture. 2. Acute hemorrhages in the paramedian parietal lobes bilaterally measuring up to 2.4 cm, 4 cc with small volume of subarachnoid hemorrhage along dorsal falx. Mild local mass effect without herniation. 3. Several additional tiny acute hemorrhages scattered throughout the brain in cortical distribution are somewhat atypical for trauma and may represent microhemorrhage related to amyloid disease, hypertension, or possibly metastasis. When patient is able to hold still, consider MRI of the brain without and with contrast. 4. Stable mild to moderate chronic microvascular ischemic changes and volume loss of the brain for age. CT maxillofacial: 1. Motion artifact, no grossly displaced facial fracture identified. No mandibular dislocation. CT cervical spine: 1. No acute fracture or dislocation identified. 2. Stable advanced cervical spondylosis greatest at C4-5 level where there is at least moderate spinal canal stenosis. Critical Value/emergent results were called by telephone at the time of interpretation on 09/02/2018 at 3:36 pm to Dr. Leonia Corona , who verbally acknowledged these results. Electronically Signed   By: Mitzi Hansen M.D.   On: 09/02/2018 15:40   Ct Abdomen Pelvis W Contrast  Result Date: 08/18/2018 CLINICAL DATA:  Pain after fall.  Abdominal infection. EXAM: CT ABDOMEN AND PELVIS WITH CONTRAST TECHNIQUE: Multidetector CT imaging of the abdomen and pelvis was performed using the standard protocol following bolus administration of intravenous contrast. CONTRAST:  OMNIPAQUE IOHEXOL 300 MG/ML  SOLN COMPARISON:  Pelvic radiographs 11/21/2015 FINDINGS: Lower chest: Top normal heart size without pericardial effusion. Eventration or elevation of left hemidiaphragm partially obscuring the  left lung base and portions of the spleen.  Hepatobiliary: Uncomplicated cholelithiasis. No enhancing hepatic mass or laceration. No biliary dilatation. Pancreas: Normal Spleen: Normal Adrenals/Urinary Tract: Adrenal glands are unremarkable. Symmetric cortical enhancement of both kidneys. Nonobstructing right renal pelvic calculus measuring 10 x 7 mm on series 3/26. Subcentimeter hypodensities in the upper and lower pole the left kidney statistically consistent with cysts. These are too small to further characterize. The urinary bladder is unremarkable. Stomach/Bowel: Stomach is within normal limits. Appendix is not well visualized but no pericecal inflammation is identified. No evidence of bowel wall thickening, distention, or inflammatory changes. Vascular/Lymphatic: Moderate aortoiliac atherosclerosis without aneurysm. CT evidence of lymphadenopathy. Reproductive: Calcified uterine fibroid.  No adnexal mass. Other: No free air or free fluid.  No abdominal hernia. Musculoskeletal: Moderate 50% compression deformity of T11 likely remote without retropulsion. Mild inferior endplate Schmorl's node of T12. Degenerative disc disease L3 through S1 with facet arthropathy greatest at L5-S1. Degenerative joint space narrowing of both hips. IMPRESSION: 1. No acute bowel obstruction or inflammation. 2. Fibroid uterus. No hydroureteronephrosis. The urinary bladder is unremarkable. 3. 50% compression deformity of T11 with inferior endplate Schmorl's node of T12 likely chronic without retropulsion noted. 4. Uncomplicated cholelithiasis. 5. Nonobstructing right renal pelvic calculus measuring 10 x 7 mm. Electronically Signed   By: Tollie Eth M.D.   On: 08/18/2018 18:04   Dg Pelvis Portable  Result Date: 09/02/2018 CLINICAL DATA:  Un witnessed fall EXAM: PORTABLE PELVIS 1-2 VIEWS COMPARISON:  08/18/2018 FINDINGS: Pelvic ring is intact. Degenerative changes of the pubic symphysis and hip joints are seen. No definitive  fracture is seen on this limited one view image. Calcified uterine fibroid change is noted. IMPRESSION: Degenerative change without acute abnormality. Electronically Signed   By: Alcide Clever M.D.   On: 09/02/2018 20:41   Dg Chest Portable 1 View  Result Date: 09/02/2018 CLINICAL DATA:  Fall, bleeding from the right side of the forehead, uncooperative patient EXAM: PORTABLE CHEST 1 VIEW COMPARISON:  Portable chest x-ray of 08/18/2017 FINDINGS: No focal pneumonia or pleural effusion is seen. Moderate cardiomegaly is stable. The descending thoracic aorta appears somewhat ectatic. Old fractures of the left and right proximal humeri are noted. The bones are osteopenic. IMPRESSION: 1. Stable moderate cardiomegaly.  No active lung disease. 2. Old fractures of the proximal humeri left-greater-than-right. There may be fusion of the left shoulder joint. Electronically Signed   By: Dwyane Dee M.D.   On: 09/02/2018 14:56   Dg Chest Portable 1 View  Result Date: 08/18/2018 CLINICAL DATA:  Larey Seat today. EXAM: PORTABLE CHEST 1 VIEW COMPARISON:  12/17/2015. FINDINGS: Stable enlarged cardiac silhouette and tortuous and mildly calcified thoracic aorta. Stable prominence of the pulmonary vasculature and interstitial markings. Mildly progressive elevation of the left hemidiaphragm with interval gas in the splenic flexure. Old bilateral shoulder posttraumatic changes, right fixation screw and degenerative changes are again demonstrated. Thoracic spine degenerative changes. IMPRESSION: 1. No acute finding. 2. Stable cardiomegaly, pulmonary vascular congestion and chronic interstitial lung disease. Electronically Signed   By: Beckie Salts M.D.   On: 08/18/2018 15:13   Dg Hips Bilat W Or Wo Pelvis 2 Views  Result Date: 08/18/2018 CLINICAL DATA:  Fall.  Bilateral hip pain. EXAM: DG HIP (WITH OR WITHOUT PELVIS) 2V BILAT COMPARISON:  None. FINDINGS: The hips are located bilaterally. Degenerative changes are worse on the right.  No acute fractures are present. Extensive vascular calcifications are present. Calcified fibroid is noted. Degenerative changes are noted in the lumbar spine. IMPRESSION: 1. No acute abnormality. 2. Moderate  degenerative changes of the hips, right greater than left. 3. Atherosclerosis. Electronically Signed   By: Marin Roberts M.D.   On: 08/18/2018 12:07   Ct Maxillofacial Wo Contrast  Result Date: 09/02/2018 CLINICAL DATA:  84 y/o F; unwitnessed fall. Laceration on the forehead with bleeding control. EXAM: CT HEAD WITHOUT CONTRAST CT MAXILLOFACIAL WITHOUT CONTRAST CT CERVICAL SPINE WITHOUT CONTRAST TECHNIQUE: Multidetector CT imaging of the head, cervical spine, and maxillofacial structures were performed using the standard protocol without intravenous contrast. Multiplanar CT image reconstructions of the cervical spine and maxillofacial structures were also generated. COMPARISON:  08/18/2018 CT head. FINDINGS: CT HEAD FINDINGS Brain: Acute hemorrhage within the paramedian parietal lobes bilaterally with subarachnoid hemorrhage along the dorsal falx. Mild local mass effect without herniation. The larger left parietal focus of hemorrhage measures 2.0 x 1.6 x 2.4 cm (volume = 4 cm^3)(AP x ML x CC series 9, image 23 and series 11, image 56). There are several additional subcentimeter hyperdense foci scattered throughout the brain that are rounded and cortically based for example in the right parietal lobe (series 9, image 27). No hydrocephalus, findings of stroke, mass effect, or herniation. Nonspecific white matter hypodensities are compatible with mild to moderate chronic microvascular ischemic changes and there is stable volume loss of the brain. Vascular: Calcific atherosclerosis of carotid siphons. No hyperdense vessel identified. Skull: Motion artifact, no grossly displaced calvarial fracture. Other: None. CT MAXILLOFACIAL FINDINGS Osseous: Extensive motion artifact. No grossly displaced facial  fracture identified. No mandibular dislocation. Orbits: Negative. No traumatic or inflammatory finding. Sinuses: Clear. Soft tissues: Negative. CT CERVICAL SPINE FINDINGS Alignment: C6-7, C7-T1, T1-2 grade 1 anterolisthesis is stable. Straightening of cervical lordosis. Skull base and vertebrae: No acute fracture. No primary bone lesion or focal pathologic process. Soft tissues and spinal canal: No prevertebral fluid or swelling. No visible canal hematoma. Disc levels: Advanced disc and facet degenerative changes greatest at the C4-5 level where prominent disc osteophyte complex results in at least moderate spinal canal stenosis. Multilevel facet effusion, likely on degenerative basis. Upper chest: Negative. Other: Calcific atherosclerosis of carotid bifurcations. IMPRESSION: CT head: 1. Motion degraded study.  No gross calvarial fracture. 2. Acute hemorrhages in the paramedian parietal lobes bilaterally measuring up to 2.4 cm, 4 cc with small volume of subarachnoid hemorrhage along dorsal falx. Mild local mass effect without herniation. 3. Several additional tiny acute hemorrhages scattered throughout the brain in cortical distribution are somewhat atypical for trauma and may represent microhemorrhage related to amyloid disease, hypertension, or possibly metastasis. When patient is able to hold still, consider MRI of the brain without and with contrast. 4. Stable mild to moderate chronic microvascular ischemic changes and volume loss of the brain for age. CT maxillofacial: 1. Motion artifact, no grossly displaced facial fracture identified. No mandibular dislocation. CT cervical spine: 1. No acute fracture or dislocation identified. 2. Stable advanced cervical spondylosis greatest at C4-5 level where there is at least moderate spinal canal stenosis. Critical Value/emergent results were called by telephone at the time of interpretation on 09/02/2018 at 3:36 pm to Dr. Leonia Corona , who verbally acknowledged these  results. Electronically Signed   By: Mitzi Hansen M.D.   On: 09/02/2018 15:40    Microbiology: Recent Results (from the past 240 hour(s))  MRSA PCR Screening     Status: Abnormal   Collection Time: 09/02/18 10:25 PM  Result Value Ref Range Status   MRSA by PCR POSITIVE (A) NEGATIVE Final    Comment:  The GeneXpert MRSA Assay (FDA approved for NASAL specimens only), is one component of a comprehensive MRSA colonization surveillance program. It is not intended to diagnose MRSA infection nor to guide or monitor treatment for MRSA infections. RESULT CALLED TO, READ BACK BY AND VERIFIED WITH: [email or web address],RN AT 0121 BY L.PITT 09/03/18      Labs: Basic Metabolic Panel: Recent Labs  Lab 09/02/18 1431 09/03/18 0347  NA 139 140  K 4.0 3.9  CL 103 106  CO2 27 24  GLUCOSE 158* 99  BUN 22 15  CREATININE 0.85 0.73  CALCIUM 8.8* 8.3*   Liver Function Tests: No results for input(s): AST, ALT, ALKPHOS, BILITOT, PROT, ALBUMIN in the last 168 hours. No results for input(s): LIPASE, AMYLASE in the last 168 hours. No results for input(s): AMMONIA in the last 168 hours. CBC: Recent Labs  Lab 09/02/18 1431 09/02/18 2044 09/03/18 0347 09/04/18 0439 09/05/18 0437  WBC 9.6 10.3 9.4 9.2  --   NEUTROABS 5.6  --   --   --   --   HGB 12.7 11.4* 10.6* 11.7* 11.9*  HCT 42.4 38.9 35.2* 39.3 39.6  MCV 94.0 92.8 91.7 91.0  --   PLT 281 278 248 261  --    Cardiac Enzymes: No results for input(s): CKTOTAL, CKMB, CKMBINDEX, TROPONINI in the last 168 hours. BNP: BNP (last 3 results) No results for input(s): BNP in the last 8760 hours.  ProBNP (last 3 results) No results for input(s): PROBNP in the last 8760 hours.  CBG: No results for input(s): GLUCAP in the last 168 hours.     Signed:  Rhetta MuraJai-Gurmukh Koraima Albertsen MD   [facility] 09/08/2018, 10:00 AM

## 2018-09-13 ENCOUNTER — Ambulatory Visit: Payer: Medicare Other | Admitting: Physician Assistant

## 2018-09-19 DEATH — deceased
